# Patient Record
Sex: Male | Born: 1945
Health system: Southern US, Community
[De-identification: ages and names within clinical notes are randomized; demographics above are authoritative.]

## PROBLEM LIST (undated history)

## (undated) DIAGNOSIS — Z95810 Presence of automatic (implantable) cardiac defibrillator: Secondary | ICD-10-CM

## (undated) DIAGNOSIS — E039 Hypothyroidism, unspecified: Secondary | ICD-10-CM

## (undated) DIAGNOSIS — I5022 Chronic systolic (congestive) heart failure: Secondary | ICD-10-CM

## (undated) DIAGNOSIS — E669 Obesity, unspecified: Secondary | ICD-10-CM

## (undated) DIAGNOSIS — C160 Malignant neoplasm of cardia: Secondary | ICD-10-CM

## (undated) DIAGNOSIS — T82118A Breakdown (mechanical) of other cardiac electronic device, initial encounter: Secondary | ICD-10-CM

## (undated) DIAGNOSIS — I428 Other cardiomyopathies: Secondary | ICD-10-CM

## (undated) DIAGNOSIS — I10 Essential (primary) hypertension: Secondary | ICD-10-CM

## (undated) DIAGNOSIS — T8859XA Other complications of anesthesia, initial encounter: Secondary | ICD-10-CM

## (undated) DIAGNOSIS — M1990 Unspecified osteoarthritis, unspecified site: Secondary | ICD-10-CM

## (undated) DIAGNOSIS — T4145XA Adverse effect of unspecified anesthetic, initial encounter: Secondary | ICD-10-CM

## (undated) DIAGNOSIS — E7800 Pure hypercholesterolemia, unspecified: Secondary | ICD-10-CM

## (undated) DIAGNOSIS — I472 Ventricular tachycardia: Secondary | ICD-10-CM

## (undated) DIAGNOSIS — I2510 Atherosclerotic heart disease of native coronary artery without angina pectoris: Secondary | ICD-10-CM

## (undated) HISTORY — DX: Other cardiomyopathies: I42.8

## (undated) HISTORY — DX: Atherosclerotic heart disease of native coronary artery without angina pectoris: I25.10

## (undated) HISTORY — DX: Chronic systolic (congestive) heart failure: I50.22

## (undated) HISTORY — DX: Ventricular tachycardia: I47.2

## (undated) HISTORY — DX: Breakdown (mechanical) of other cardiac electronic device, initial encounter: T82.118A

## (undated) HISTORY — DX: Obesity, unspecified: E66.9

## (undated) HISTORY — PX: CARDIAC DEFIBRILLATOR PLACEMENT: SHX171

## (undated) HISTORY — DX: Presence of automatic (implantable) cardiac defibrillator: Z95.810

## (undated) HISTORY — DX: Malignant neoplasm of cardia: C16.0

## (undated) HISTORY — PX: TONSILLECTOMY AND ADENOIDECTOMY: SUR1326

---

## 2001-11-30 ENCOUNTER — Inpatient Hospital Stay (HOSPITAL_COMMUNITY): Admission: EM | Admit: 2001-11-30 | Discharge: 2001-12-04 | Payer: Self-pay | Admitting: Emergency Medicine

## 2001-11-30 ENCOUNTER — Encounter: Payer: Self-pay | Admitting: Emergency Medicine

## 2001-11-30 HISTORY — PX: CARDIAC CATHETERIZATION: SHX172

## 2004-10-07 ENCOUNTER — Encounter: Admission: RE | Admit: 2004-10-07 | Discharge: 2004-10-07 | Payer: Self-pay | Admitting: *Deleted

## 2004-10-13 ENCOUNTER — Observation Stay (HOSPITAL_COMMUNITY): Admission: AD | Admit: 2004-10-13 | Discharge: 2004-10-14 | Payer: Self-pay | Admitting: *Deleted

## 2004-10-13 ENCOUNTER — Encounter: Payer: Self-pay | Admitting: Internal Medicine

## 2005-07-27 DIAGNOSIS — I4729 Other ventricular tachycardia: Secondary | ICD-10-CM

## 2005-07-27 DIAGNOSIS — I472 Ventricular tachycardia: Secondary | ICD-10-CM

## 2005-07-27 HISTORY — DX: Ventricular tachycardia: I47.2

## 2005-07-27 HISTORY — DX: Other ventricular tachycardia: I47.29

## 2006-01-11 ENCOUNTER — Encounter: Admission: RE | Admit: 2006-01-11 | Discharge: 2006-01-11 | Payer: Self-pay | Admitting: Cardiology

## 2006-01-24 DIAGNOSIS — I2510 Atherosclerotic heart disease of native coronary artery without angina pectoris: Secondary | ICD-10-CM

## 2006-01-24 HISTORY — DX: Atherosclerotic heart disease of native coronary artery without angina pectoris: I25.10

## 2006-01-24 HISTORY — PX: CORONARY ANGIOPLASTY WITH STENT PLACEMENT: SHX49

## 2006-01-28 ENCOUNTER — Ambulatory Visit (HOSPITAL_COMMUNITY): Admission: AD | Admit: 2006-01-28 | Discharge: 2006-01-29 | Payer: Self-pay | Admitting: Cardiology

## 2006-03-27 HISTORY — PX: CARDIAC CATHETERIZATION: SHX172

## 2006-04-09 LAB — ICD DEVICE OBSERVATION

## 2006-07-14 ENCOUNTER — Ambulatory Visit: Payer: Self-pay | Admitting: Internal Medicine

## 2006-08-06 ENCOUNTER — Ambulatory Visit: Payer: Self-pay | Admitting: Internal Medicine

## 2006-08-06 ENCOUNTER — Ambulatory Visit (HOSPITAL_COMMUNITY): Admission: RE | Admit: 2006-08-06 | Discharge: 2006-08-06 | Payer: Self-pay | Admitting: Internal Medicine

## 2010-05-27 HISTORY — PX: NM MYOCAR PERF EJECTION FRACTION: HXRAD630

## 2010-06-03 ENCOUNTER — Encounter: Payer: Self-pay | Admitting: Internal Medicine

## 2010-08-26 NOTE — Letter (Signed)
Summary: [facility]  [facility]   Imported By: Marylou Mccoy 06/27/2010 13:02:35  _____________________________________________________________________  External Attachment:    Type:   Image     Comment:   External Document

## 2011-01-23 ENCOUNTER — Encounter: Payer: Self-pay | Admitting: Internal Medicine

## 2011-01-26 ENCOUNTER — Ambulatory Visit (INDEPENDENT_AMBULATORY_CARE_PROVIDER_SITE_OTHER): Payer: PRIVATE HEALTH INSURANCE | Admitting: Internal Medicine

## 2011-01-26 ENCOUNTER — Encounter: Payer: Self-pay | Admitting: Internal Medicine

## 2011-01-26 DIAGNOSIS — I2510 Atherosclerotic heart disease of native coronary artery without angina pectoris: Secondary | ICD-10-CM | POA: Insufficient documentation

## 2011-01-26 DIAGNOSIS — I4729 Other ventricular tachycardia: Secondary | ICD-10-CM

## 2011-01-26 DIAGNOSIS — T82198A Other mechanical complication of other cardiac electronic device, initial encounter: Secondary | ICD-10-CM | POA: Insufficient documentation

## 2011-01-26 DIAGNOSIS — I5042 Chronic combined systolic (congestive) and diastolic (congestive) heart failure: Secondary | ICD-10-CM | POA: Insufficient documentation

## 2011-01-26 DIAGNOSIS — I472 Ventricular tachycardia, unspecified: Secondary | ICD-10-CM | POA: Insufficient documentation

## 2011-01-26 DIAGNOSIS — Z95810 Presence of automatic (implantable) cardiac defibrillator: Secondary | ICD-10-CM | POA: Insufficient documentation

## 2011-01-26 DIAGNOSIS — I5022 Chronic systolic (congestive) heart failure: Secondary | ICD-10-CM

## 2011-01-26 DIAGNOSIS — I509 Heart failure, unspecified: Secondary | ICD-10-CM

## 2011-01-26 DIAGNOSIS — I428 Other cardiomyopathies: Secondary | ICD-10-CM

## 2011-01-26 HISTORY — DX: Presence of automatic (implantable) cardiac defibrillator: Z95.810

## 2011-01-26 NOTE — Assessment & Plan Note (Signed)
The patient has nonsustained ventricular tachycardia which potentially may represent a significant percentage of his beats based on a cardiogram from Dr. Kandis Cocking office. I would concur with Dr. Alanda Amass at the ongoing use of dronaderone in the setting of left ventricular dysfunction is probably more hazard that it's worse especially given the paucity of apparent benefit. I think the potential contribution however of nonsustained ventricular tachycardia to his current myopathy needs to be considered and would recommend that we try amiodarone and she was happened with this thyroid tissue.

## 2011-01-26 NOTE — Assessment & Plan Note (Signed)
Stable on current medications 

## 2011-01-26 NOTE — Assessment & Plan Note (Signed)
The patient's device was interrogated.  The information was reviewed. No changes were made in the programming.    

## 2011-01-26 NOTE — Assessment & Plan Note (Signed)
We reviewed extensively  The issue of lead fracture and the stratregies for minimizing the likelihood of inappropriate shocks was discussed. We gave the patient a magnet and the explanation letter At that time a device generator replacement his defibrillator lead should be replaced. At that time consideration could also be given depending on his ejection fraction has a CRT upgrade.

## 2011-01-26 NOTE — Progress Notes (Signed)
HPI: Jerry Farley is a 65 y.o. male Seen at the request of Dr. Alanda Amass for consideration of ICD upgrade to CRT.  He is a long-standing history of cardiomyopathy. It is felt to be a combined nonischemic ischemic disease. He undergone  The patient initially in 2003 identify nonischemic cardiomyopathy. In 2006 he underwent ICD implantation by Dr. Severiano Gilbert has a single chamber Medtronic device receiving a [phone] defibrillator lead. The next year or he was hospitalized for polymorphic ventricular tachycardia.This is associated with multiple ICD discharges.  He underwent catheterization in [place] and was found to have a 70% RCA lesion that was treated medically. The circumflex stent was patent.. Because of the ventricular tachycardia, amiodarone was started. Subsequent to this it was discontinued because of thyroid issues the specifics of which are not available. dronaderone was initiated.  His cardiac evaluation over the last year has included a Myoview scan in November 2011 demonstrating some scar but no significant ischemia. An echo May 2012 demonstrated ejection fraction of 35-40%.  A stress test done in early May was no significant runs of nonsustained ventricular tachycardia.  He has modest exercise intolerance shortness of breath at one to 2 flights of stairs. He does not have orthopnea nocturnal dyspnea or edema. Current Outpatient Prescriptions  Medication Sig Dispense Refill  . aspirin 81 MG tablet Take 81 mg by mouth daily.        Marland Kitchen atorvastatin (LIPITOR) 10 MG tablet Take 10 mg by mouth daily.        . carvedilol (COREG) 25 MG tablet Take 25 mg by mouth 2 (two) times daily with a meal.        . clopidogrel (PLAVIX) 75 MG tablet Take 75 mg by mouth daily.        Marland Kitchen dronedarone (MULTAQ) 400 MG tablet Take 400 mg by mouth 2 (two) times daily with a meal.        . ezetimibe (ZETIA) 10 MG tablet Take 10 mg by mouth daily.        . Magnesium Oxide 400 MG CAPS Take 1 capsule by mouth.        .  Multiple Vitamin (MULTIVITAMIN) tablet Take 1 tablet by mouth daily.        . ramipril (ALTACE) 10 MG capsule Take 10 mg by mouth daily.        . fish oil-omega-3 fatty acids 1000 MG capsule Take 2 g by mouth daily.          No Known Allergies  Past Medical History  Diagnosis Date  . NICM (nonischemic cardiomyopathy)   . CAD (coronary artery disease)   . Ventricular tachycardia, non-sustained     No past surgical history on file.  No family history on file. family history is negative for coronary disease.  History   Social History  . Marital Status: Married    Spouse Name: N/A    Number of Children: N/A  . Years of Education: N/A   Occupational History  . retired -- [employer]    Social History Main Topics  . Smoking status: Former Smoker    Quit date: 07/28/2003  . Smokeless tobacco: Never Used  . Alcohol Use: No  . Drug Use: No  . Sexually Active: Not on file   Other Topics Concern  . Not on file   Social History Narrative  . No narrative on file    Fourteen point review of systems was negative except as noted in HPI and PMH glasses, blood  from the urethra.  PHYSICAL EXAMINATION  Blood pressure 96/66, pulse 69, height 5\' 6"  (1.676 m), weight 210 lb 1.9 oz (95.31 kg).   Well developed and nourished middle-aged older Caucasian male appearing his stated agein no acute distress HENT normal Neck supple with JVP-7-8 cm Carotids brisk and full without bruits Back without scoliosis or kyphosis Clear Regular rate and rhythm, PMI is displaced. There is a short systolic murmur. Abd-soft with active BS without hepatomegaly or midline pulsation Femoral pulses 2+ distal pulses intact No Clubbing cyanosis edema Skin-warm and dry LN-neg submandibular and supraclavicular A & Oriented CN 3-12 normal  Grossly normal sensory and motor function Affect engaging . Electrocardiogram today demonstrated sinus rhythm at 69 with intervals of 0.20/0.016/0.43 visit R. Prime in  lead V1 and left axis deviation consistent bifascicular block.  This is in contradistinction to elective cardiogram from Dr. Kandis Cocking office with what appears to be a left bundle. On mental Ecotrin and Advil of nonsustained ventricular tachycardia interestingly with a relatively narrow QRS

## 2011-01-26 NOTE — Assessment & Plan Note (Signed)
Patient has nonischemic cardiomyopathy on guide line associated for surgical therapy. Possibly because of this, an ejection fraction most recently was 35-40%. Hence at this point there be no indication for upgrade to CRT.  I wonder to what degree nonsustained ventricular tachycardia might be contributing to the cardiomyopathy. I partially we do not have histogram data via this device to estimate PVC frequency.

## 2011-01-29 ENCOUNTER — Telehealth: Payer: Self-pay | Admitting: Internal Medicine

## 2011-01-29 NOTE — Telephone Encounter (Signed)
All Cardiac faxed to [facility] for Dr.Weintraub @ [phone]  01/29/11/km

## 2011-06-01 ENCOUNTER — Encounter: Payer: PRIVATE HEALTH INSURANCE | Admitting: Internal Medicine

## 2011-06-16 ENCOUNTER — Encounter: Payer: PRIVATE HEALTH INSURANCE | Admitting: Internal Medicine

## 2011-08-11 ENCOUNTER — Encounter: Payer: Self-pay | Admitting: Internal Medicine

## 2011-08-11 ENCOUNTER — Ambulatory Visit (INDEPENDENT_AMBULATORY_CARE_PROVIDER_SITE_OTHER): Payer: PRIVATE HEALTH INSURANCE | Admitting: Internal Medicine

## 2011-08-11 VITALS — BP 140/84 | HR 67 | Ht 66.0 in | Wt 217.0 lb

## 2011-08-11 DIAGNOSIS — I509 Heart failure, unspecified: Secondary | ICD-10-CM

## 2011-08-11 DIAGNOSIS — T82198A Other mechanical complication of other cardiac electronic device, initial encounter: Secondary | ICD-10-CM

## 2011-08-11 DIAGNOSIS — Z95810 Presence of automatic (implantable) cardiac defibrillator: Secondary | ICD-10-CM

## 2011-08-11 DIAGNOSIS — I4729 Other ventricular tachycardia: Secondary | ICD-10-CM

## 2011-08-11 DIAGNOSIS — I5022 Chronic systolic (congestive) heart failure: Secondary | ICD-10-CM

## 2011-08-11 DIAGNOSIS — I472 Ventricular tachycardia, unspecified: Secondary | ICD-10-CM

## 2011-08-11 DIAGNOSIS — I428 Other cardiomyopathies: Secondary | ICD-10-CM

## 2011-08-11 LAB — ICD DEVICE OBSERVATION
CHARGE TIME: 10.2 s
FVT: 0
RV LEAD AMPLITUDE: 2.2 mv
TZAT-0001SLOWVT: 1
TZAT-0005SLOWVT: 84 pct
TZAT-0005SLOWVT: 91 pct
TZAT-0011FASTVT: 10 ms
TZAT-0011SLOWVT: 10 ms
TZAT-0011SLOWVT: 10 ms
TZAT-0012FASTVT: 200 ms
TZAT-0013FASTVT: 1
TZAT-0013SLOWVT: 2
TZAT-0013SLOWVT: 2
TZAT-0018FASTVT: NEGATIVE
TZAT-0018SLOWVT: NEGATIVE
TZAT-0019FASTVT: 8 V
TZAT-0019SLOWVT: 8 V
TZAT-0020FASTVT: 1.6 ms
TZON-0003FASTVT: 250 ms
TZON-0004SLOWVT: 16
TZON-0005SLOWVT: 12
TZON-0008FASTVT: 1 ms
TZON-0008SLOWVT: 1 ms
TZON-0010FASTVT: 30 ms
TZST-0001FASTVT: 3
TZST-0001FASTVT: 4
TZST-0001FASTVT: 6
TZST-0001SLOWVT: 4
TZST-0001SLOWVT: 6
TZST-0003FASTVT: 20 J
TZST-0003FASTVT: 30 J
TZST-0003FASTVT: 35 J
TZST-0003SLOWVT: 30 J
TZST-0003SLOWVT: 35 J
VENTRICULAR PACING ICD: 0 pct
VF: 0

## 2011-08-11 NOTE — Assessment & Plan Note (Signed)
Continue on current medications. With his congestive symptoms I wonder whether he would be a candidate for Aldactone. I will defer this to Dr. Cloyde Reams

## 2011-08-11 NOTE — Patient Instructions (Signed)
We will see you back on an as needed basis.  

## 2011-08-11 NOTE — Progress Notes (Signed)
HPI  MASON DIBIASIO is a 66 y.o. male Seen for a long-standing history of cardiomyopathy  felt to be a combined nonischemic ischemic disease.  In 2006 he underwent ICD implantation by Dr. Severiano Gilbert has a single chamber Medtronic device receiving a [phone] defibrillator lead. The next year or he was hospitalized for polymorphic ventricular tachycardia.This was associated with multiple ICD discharges.  He underwent catheterization in [place] and was found to have a 70% RCA lesion that was treated medically. The circumflex stent was patent.. Because of the ventricular tachycardia, amiodarone was started. Subsequent to this it was discontinued because of thyroid issues the specifics of which are not available. dronaderone was initiated this has subsequently been discontinued and he is now back on amiodarone.  His cardiac evaluation over the last year has included a Myoview scan in November 2011 demonstrating some scar but no significant ischemia. An echo May 2012 demonstrated ejection fraction of 35-40%.  A stress test done in early May was no significant runs of nonsustained ventricular tachycardia  At initial evaluation in May his QRS duration was narrow. He had been referred for consideration of CRT ugraders  He is not sure why is that he is here today he's had no reassessment of left ventricular function since I saw him last  Shortness of breath is stable there is no edema and no chest pain   Past Medical History  Diagnosis Date  . NICM (nonischemic cardiomyopathy)     ejection fraction 35-40% by echo May 2012  . CAD (coronary artery disease)     prior circumflex stenting  . Ventricular tachycardia, non-sustained   . Automatic implantable cardiac defibrillator in situ   . 6948-lead   . Chronic systolic congestive heart failure     No past surgical history on file.  Current Outpatient Prescriptions  Medication Sig Dispense Refill  . amiodarone (PACERONE) 200 MG tablet Take 200 mg by  mouth daily.      Marland Kitchen aspirin 81 MG tablet Take 81 mg by mouth daily.        Marland Kitchen atorvastatin (LIPITOR) 10 MG tablet Take 10 mg by mouth daily.        . carvedilol (COREG) 25 MG tablet Take 25 mg by mouth 2 (two) times daily with a meal.        . clopidogrel (PLAVIX) 75 MG tablet Take 75 mg by mouth daily.        . Magnesium Oxide 400 MG CAPS Take 1 capsule by mouth.        . Multiple Vitamin (MULTIVITAMIN) tablet Take 1 tablet by mouth daily.        . ramipril (ALTACE) 10 MG capsule Take 10 mg by mouth daily.        Marland Kitchen ezetimibe (ZETIA) 10 MG tablet Take 10 mg by mouth daily.        . fish oil-omega-3 fatty acids 1000 MG capsule Take 2 g by mouth daily.          No Known Allergies  Review of Systems negative except from HPI and PMH  Physical Exam BP 140/84  Pulse 67  Ht 5\' 6"  (1.676 m)  Wt 217 lb (98.431 kg)  BMI 35.02 kg/m2 Well developed and well nourished in no acute distress HENT normal E scleral and icterus clear Neck Supple JVP flat; carotids brisk and full Clear to ausculation Regular rate and rhythm, no murmurs gallops or rub Soft with active bowel sounds No clubbing cyanosis none Edema Alert and oriented,  grossly normal motor and sensory function Skin Warm and Dry  Demonstrates sinus rhythm at 61 Intervals 0.20/0.17/0.48 Axis is leftward -87 there is evidence of right bundle branch block  Assessment and  Plan

## 2011-08-11 NOTE — Assessment & Plan Note (Signed)
The patient's device was interrogated.  The information was reviewed. No changes were made in the programming.    

## 2011-08-11 NOTE — Assessment & Plan Note (Signed)
We reiterated the issues related to the 6949-lead. The family has a magnet and instructions

## 2011-08-11 NOTE — Assessment & Plan Note (Signed)
Ventricular tachycardia has been eliminated with the addition of amiodarone. According to the patient the thyroid issues are stable

## 2011-08-11 NOTE — Assessment & Plan Note (Signed)
As above.

## 2012-01-12 ENCOUNTER — Encounter: Payer: Self-pay | Admitting: Internal Medicine

## 2012-01-22 ENCOUNTER — Encounter: Payer: Self-pay | Admitting: Internal Medicine

## 2012-01-22 ENCOUNTER — Ambulatory Visit (INDEPENDENT_AMBULATORY_CARE_PROVIDER_SITE_OTHER): Payer: Medicare Other | Admitting: Internal Medicine

## 2012-01-22 ENCOUNTER — Encounter: Payer: Self-pay | Admitting: *Deleted

## 2012-01-22 VITALS — BP 126/74 | HR 64 | Ht 66.0 in | Wt 214.0 lb

## 2012-01-22 DIAGNOSIS — Z95810 Presence of automatic (implantable) cardiac defibrillator: Secondary | ICD-10-CM

## 2012-01-22 DIAGNOSIS — I509 Heart failure, unspecified: Secondary | ICD-10-CM

## 2012-01-22 DIAGNOSIS — I472 Ventricular tachycardia, unspecified: Secondary | ICD-10-CM

## 2012-01-22 DIAGNOSIS — I5022 Chronic systolic (congestive) heart failure: Secondary | ICD-10-CM

## 2012-01-22 DIAGNOSIS — I428 Other cardiomyopathies: Secondary | ICD-10-CM

## 2012-01-22 DIAGNOSIS — I4729 Other ventricular tachycardia: Secondary | ICD-10-CM

## 2012-01-22 NOTE — Assessment & Plan Note (Signed)
The patient has an ICD that has reached ERI. He has class II symptoms with a QRS duration of greater than 150 ms and a pattern of-IVCD. It is reasonable to consider CRT upgrade his ejection fraction remains less than 35%.  I have reviewed with him and his wife the potential benefits and risks he understood and are willing to proceed.  Not withstanding his ejection fraction however, he has a history of polymorphic ventricular tachycardia for which the defibrillator was implanted for secondary prevention. He has a 6949-lead in [place]  which requires replacement given his young age and high risk of fracture. He understands this as well

## 2012-01-22 NOTE — Assessment & Plan Note (Signed)
We'll continue amiodarone. No intercurrent defibrillator therapies

## 2012-01-22 NOTE — Assessment & Plan Note (Signed)
As above.

## 2012-01-22 NOTE — Patient Instructions (Signed)
Dr. Graciela Husbands has recommended you have a Bi- Ventricular defibrillator upgrade. Please see the instruction sheet you were given today.

## 2012-01-22 NOTE — Progress Notes (Signed)
HPI  Jerry Farley is a 66 y.o. male Seen for a long-standing history of cardiomyopathy  felt to be a combined nonischemic ischemic disease. In 2006 he underwent ICD implantation by Dr. Severiano Gilbert has a single chamber Medtronic device receiving a [phone] defibrillator lead. The next year or he was hospitalized for polymorphic ventricular tachycardia.This was associated with multiple ICD discharges. He underwent catheterization in [place] and was found to have a 70% RCA lesion that was treated medically. The circumflex stent was patent.. Because of the ventricular tachycardia, amiodarone was started. Subsequent to this it was discontinued because of thyroid issues the specifics of which are not available. dronaderone was initiated this has subsequently been discontinued and he is now back on amiodarone.  His cardiac evaluation has included a Myoview scan in November 2011 demonstrating some scar but no significant ischemia. An echo May 2012 demonstrated ejection fraction of 35-40%.  Repeat echo is pending functinal status is class 2 with modest DOE    He has shortness of breath is stable there is no edema and no chest pain   Past Medical History  Diagnosis Date  . NICM (nonischemic cardiomyopathy)     ejection fraction 35-40% by echo May 2012  . CAD (coronary artery disease)     prior circumflex stenting  . Ventricular tachycardia, non-sustained   . Automatic implantable cardiac defibrillator in situ   . 6948-lead   . Chronic systolic congestive heart failure     No past surgical history on file.  Current Outpatient Prescriptions  Medication Sig Dispense Refill  . amiodarone (PACERONE) 200 MG tablet Take 200 mg by mouth daily.      Marland Kitchen aspirin 81 MG tablet Take 81 mg by mouth daily.        Marland Kitchen atorvastatin (LIPITOR) 10 MG tablet Take 10 mg by mouth daily.        . carvedilol (COREG) 25 MG tablet Take 25 mg by mouth 2 (two) times daily with a meal.        . clopidogrel (PLAVIX) 75 MG tablet  Take 75 mg by mouth daily.        Marland Kitchen ezetimibe (ZETIA) 10 MG tablet Take 10 mg by mouth daily.        . fish oil-omega-3 fatty acids 1000 MG capsule Take 2 g by mouth daily.        Marland Kitchen levothyroxine (SYNTHROID, LEVOTHROID) 25 MCG tablet       . Magnesium Oxide 400 MG CAPS Take 1 capsule by mouth.        . Multiple Vitamin (MULTIVITAMIN) tablet Take 1 tablet by mouth daily.        . ramipril (ALTACE) 10 MG capsule Take 10 mg by mouth daily.        Marland Kitchen spironolactone (ALDACTONE) 25 MG tablet         No Known Allergies  Review of Systems negative except from HPI and PMH  Physical Exam BP 126/74  Pulse 64  Ht 5\' 6"  (1.676 m)  Wt 214 lb (97.07 kg)  BMI 34.54 kg/m2 Well developed and well nourished in no acute distress HENT normal E scleral and icterus clear Neck Supple JVP flat; carotids brisk and full Clear to ausculation Regular rate and rhythm, no murmurs gallops or rub Soft with active bowel sounds No clubbing cyanosis none Edema Alert and oriented, grossly normal motor and sensory function Skin Warm and Dry  We'll echocardiogram demonstrates sinus rhythm at 59 and 7 intervals 21/17/48 Axis is -90  IVCD left axis deviation  Assessment and  Plan

## 2012-01-22 NOTE — Assessment & Plan Note (Signed)
As above Continue current medications. Currently euvolemic

## 2012-01-27 ENCOUNTER — Encounter (HOSPITAL_COMMUNITY): Payer: Self-pay | Admitting: Pharmacy Technician

## 2012-02-01 ENCOUNTER — Other Ambulatory Visit: Payer: Self-pay | Admitting: Internal Medicine

## 2012-02-01 DIAGNOSIS — Z95810 Presence of automatic (implantable) cardiac defibrillator: Secondary | ICD-10-CM

## 2012-02-03 ENCOUNTER — Other Ambulatory Visit (INDEPENDENT_AMBULATORY_CARE_PROVIDER_SITE_OTHER): Payer: Medicare Other

## 2012-02-03 DIAGNOSIS — I4729 Other ventricular tachycardia: Secondary | ICD-10-CM

## 2012-02-03 DIAGNOSIS — I472 Ventricular tachycardia, unspecified: Secondary | ICD-10-CM

## 2012-02-03 DIAGNOSIS — Z95810 Presence of automatic (implantable) cardiac defibrillator: Secondary | ICD-10-CM

## 2012-02-03 DIAGNOSIS — I5022 Chronic systolic (congestive) heart failure: Secondary | ICD-10-CM

## 2012-02-03 DIAGNOSIS — I428 Other cardiomyopathies: Secondary | ICD-10-CM

## 2012-02-03 DIAGNOSIS — I509 Heart failure, unspecified: Secondary | ICD-10-CM

## 2012-02-03 LAB — CBC WITH DIFFERENTIAL/PLATELET
Basophils Relative: 0.6 % (ref 0.0–3.0)
Eosinophils Absolute: 0.3 10*3/uL (ref 0.0–0.7)
Eosinophils Relative: 4.9 % (ref 0.0–5.0)
HCT: 41.1 % (ref 39.0–52.0)
Lymphs Abs: 1.9 10*3/uL (ref 0.7–4.0)
MCHC: 33.2 g/dL (ref 30.0–36.0)
MCV: 97.7 fl (ref 78.0–100.0)
Monocytes Absolute: 0.7 10*3/uL (ref 0.1–1.0)
Neutrophils Relative %: 58.9 % (ref 43.0–77.0)
Platelets: 161 10*3/uL (ref 150.0–400.0)
WBC: 7.1 10*3/uL (ref 4.5–10.5)

## 2012-02-03 LAB — BASIC METABOLIC PANEL
BUN: 17 mg/dL (ref 6–23)
Calcium: 9.3 mg/dL (ref 8.4–10.5)
GFR: 54.3 mL/min — ABNORMAL LOW (ref >60.00)
Glucose, Bld: 120 mg/dL — ABNORMAL HIGH (ref 70–99)
Potassium: 5.3 mEq/L — ABNORMAL HIGH (ref 3.5–5.1)
Sodium: 138 mEq/L (ref 135–145)

## 2012-02-04 ENCOUNTER — Encounter: Payer: Self-pay | Admitting: Internal Medicine

## 2012-02-09 MED ORDER — CEFAZOLIN SODIUM-DEXTROSE 2-3 GM-% IV SOLR
2.0000 g | INTRAVENOUS | Status: DC
  Filled 2012-02-09: qty 50

## 2012-02-09 MED ORDER — SODIUM CHLORIDE 0.9 % IR SOLN
80.0000 mg | Status: DC
  Filled 2012-02-09: qty 2

## 2012-02-10 ENCOUNTER — Encounter (HOSPITAL_COMMUNITY): Payer: Self-pay | Admitting: General Practice

## 2012-02-10 ENCOUNTER — Ambulatory Visit (HOSPITAL_COMMUNITY)
Admission: RE | Admit: 2012-02-10 | Discharge: 2012-02-11 | Disposition: A | Discharge status: Home / Self Care | Payer: Medicare Other | Source: Ambulatory Visit | Attending: Internal Medicine | Admitting: Internal Medicine

## 2012-02-10 ENCOUNTER — Ambulatory Visit (HOSPITAL_COMMUNITY): Payer: Medicare Other

## 2012-02-10 ENCOUNTER — Encounter (HOSPITAL_COMMUNITY)
Admission: RE | Disposition: A | Discharge status: Home / Self Care | Payer: Self-pay | Source: Ambulatory Visit | Attending: Internal Medicine

## 2012-02-10 DIAGNOSIS — I5022 Chronic systolic (congestive) heart failure: Secondary | ICD-10-CM | POA: Insufficient documentation

## 2012-02-10 DIAGNOSIS — Z95810 Presence of automatic (implantable) cardiac defibrillator: Secondary | ICD-10-CM | POA: Diagnosis present

## 2012-02-10 DIAGNOSIS — I472 Ventricular tachycardia, unspecified: Secondary | ICD-10-CM | POA: Diagnosis present

## 2012-02-10 DIAGNOSIS — Z4502 Encounter for adjustment and management of automatic implantable cardiac defibrillator: Secondary | ICD-10-CM | POA: Insufficient documentation

## 2012-02-10 DIAGNOSIS — I509 Heart failure, unspecified: Secondary | ICD-10-CM | POA: Insufficient documentation

## 2012-02-10 DIAGNOSIS — I2510 Atherosclerotic heart disease of native coronary artery without angina pectoris: Secondary | ICD-10-CM | POA: Insufficient documentation

## 2012-02-10 DIAGNOSIS — I4729 Other ventricular tachycardia: Secondary | ICD-10-CM

## 2012-02-10 DIAGNOSIS — I5042 Chronic combined systolic (congestive) and diastolic (congestive) heart failure: Secondary | ICD-10-CM | POA: Diagnosis present

## 2012-02-10 DIAGNOSIS — I428 Other cardiomyopathies: Secondary | ICD-10-CM | POA: Insufficient documentation

## 2012-02-10 HISTORY — DX: Other complications of anesthesia, initial encounter: T88.59XA

## 2012-02-10 HISTORY — DX: Adverse effect of unspecified anesthetic, initial encounter: T41.45XA

## 2012-02-10 HISTORY — DX: Presence of automatic (implantable) cardiac defibrillator: Z95.810

## 2012-02-10 HISTORY — DX: Unspecified osteoarthritis, unspecified site: M19.90

## 2012-02-10 HISTORY — DX: Hypothyroidism, unspecified: E03.9

## 2012-02-10 HISTORY — PX: BI-VENTRICULAR IMPLANTABLE CARDIOVERTER DEFIBRILLATOR UPGRADE: SHX5461

## 2012-02-10 HISTORY — DX: Pure hypercholesterolemia, unspecified: E78.00

## 2012-02-10 LAB — SURGICAL PCR SCREEN: MRSA, PCR: NEGATIVE

## 2012-02-10 SURGERY — BI-VENTRICULAR IMPLANTABLE CARDIOVERTER DEFIBRILLATOR UPGRADE
Anesthesia: LOCAL

## 2012-02-10 MED ORDER — RAMIPRIL 10 MG PO CAPS
10.0000 mg | ORAL_CAPSULE | Freq: Every day | ORAL | Status: DC
  Administered 2012-02-11: 11:00:00 10 mg via ORAL
  Filled 2012-02-10: qty 1

## 2012-02-10 MED ORDER — CHLORHEXIDINE GLUCONATE 4 % EX LIQD: 60.0000 mL | Freq: Once | CUTANEOUS | Status: DC

## 2012-02-10 MED ORDER — HEPARIN (PORCINE) IN NACL 2-0.9 UNIT/ML-% IJ SOLN
INTRAMUSCULAR | Status: AC
  Filled 2012-02-10: qty 1000

## 2012-02-10 MED ORDER — EZETIMIBE 10 MG PO TABS
10.0000 mg | ORAL_TABLET | Freq: Every day | ORAL | Status: DC
  Administered 2012-02-11: 11:00:00 10 mg via ORAL
  Filled 2012-02-10: qty 1

## 2012-02-10 MED ORDER — ONDANSETRON HCL 4 MG/2ML IJ SOLN: 4.0000 mg | Freq: Four times a day (QID) | INTRAMUSCULAR | Status: DC | PRN

## 2012-02-10 MED ORDER — LEVOTHYROXINE SODIUM 25 MCG PO TABS
25.0000 ug | ORAL_TABLET | Freq: Every day | ORAL | Status: DC
  Administered 2012-02-11: 25 ug via ORAL
  Filled 2012-02-10 (×2): qty 1

## 2012-02-10 MED ORDER — SODIUM CHLORIDE 0.9 % IV SOLN
250.0000 mL | INTRAVENOUS | Status: DC
  Administered 2012-02-10: 1000 mL via INTRAVENOUS

## 2012-02-10 MED ORDER — SPIRONOLACTONE 12.5 MG HALF TABLET
12.5000 mg | ORAL_TABLET | Freq: Once | ORAL | Status: AC
  Administered 2012-02-10: 18:00:00 12.5 mg via ORAL
  Filled 2012-02-10 (×2): qty 1

## 2012-02-10 MED ORDER — AMIODARONE HCL 200 MG PO TABS
200.0000 mg | ORAL_TABLET | Freq: Every day | ORAL | Status: DC
  Administered 2012-02-11: 200 mg via ORAL
  Filled 2012-02-10: qty 1

## 2012-02-10 MED ORDER — LIDOCAINE HCL (PF) 1 % IJ SOLN
INTRAMUSCULAR | Status: AC
  Filled 2012-02-10: qty 60

## 2012-02-10 MED ORDER — MIDAZOLAM HCL 5 MG/5ML IJ SOLN
INTRAMUSCULAR | Status: AC
  Filled 2012-02-10: qty 5

## 2012-02-10 MED ORDER — MUPIROCIN 2 % EX OINT
TOPICAL_OINTMENT | Freq: Two times a day (BID) | CUTANEOUS | Status: DC
  Filled 2012-02-10: qty 22

## 2012-02-10 MED ORDER — SODIUM CHLORIDE 0.45 % IV SOLN
INTRAVENOUS | Status: DC
  Administered 2012-02-10: 1000 mL via INTRAVENOUS

## 2012-02-10 MED ORDER — FENTANYL CITRATE 0.05 MG/ML IJ SOLN
INTRAMUSCULAR | Status: AC
  Filled 2012-02-10: qty 2

## 2012-02-10 MED ORDER — CARVEDILOL 25 MG PO TABS
25.0000 mg | ORAL_TABLET | Freq: Two times a day (BID) | ORAL | Status: DC
  Administered 2012-02-10 – 2012-02-11 (×2): 25 mg via ORAL
  Filled 2012-02-10 (×4): qty 1

## 2012-02-10 MED ORDER — SODIUM CHLORIDE 0.9 % IV SOLN
INTRAVENOUS | Status: AC
  Administered 2012-02-10: 13:00:00 via INTRAVENOUS

## 2012-02-10 MED ORDER — CLOPIDOGREL BISULFATE 75 MG PO TABS
75.0000 mg | ORAL_TABLET | Freq: Every day | ORAL | Status: DC
  Administered 2012-02-11: 75 mg via ORAL
  Filled 2012-02-10: qty 1

## 2012-02-10 MED ORDER — ATORVASTATIN CALCIUM 10 MG PO TABS
10.0000 mg | ORAL_TABLET | Freq: Every day | ORAL | Status: DC
  Filled 2012-02-10: qty 1

## 2012-02-10 MED ORDER — SODIUM CHLORIDE 0.9 % IJ SOLN: 3.0000 mL | INTRAMUSCULAR | Status: DC | PRN

## 2012-02-10 MED ORDER — ASPIRIN 81 MG PO CHEW
81.0000 mg | CHEWABLE_TABLET | Freq: Every day | ORAL | Status: DC
  Administered 2012-02-11: 11:00:00 81 mg via ORAL
  Filled 2012-02-10: qty 1

## 2012-02-10 MED ORDER — SODIUM CHLORIDE 0.9 % IJ SOLN: 3.0000 mL | Freq: Two times a day (BID) | INTRAMUSCULAR | Status: DC

## 2012-02-10 MED ORDER — ASPIRIN 81 MG PO TABS
81.0000 mg | ORAL_TABLET | Freq: Every day | ORAL | Status: DC
Start: 2012-02-10 — End: 2012-02-10

## 2012-02-10 MED ORDER — ACETAMINOPHEN 325 MG PO TABS: 325.0000 mg | ORAL_TABLET | ORAL | Status: DC | PRN

## 2012-02-10 MED ORDER — CEFAZOLIN SODIUM 1-5 GM-% IV SOLN
1.0000 g | Freq: Four times a day (QID) | INTRAVENOUS | Status: AC
  Administered 2012-02-10 – 2012-02-11 (×3): 1 g via INTRAVENOUS
  Filled 2012-02-10 (×4): qty 50

## 2012-02-10 NOTE — CV Procedure (Addendum)
TAYSHUN GAPPA [id number]  [id number]  Preop Dx: IVCD, CHF prev ICD with Recall ICD lead generator at [facility] Postop Dx same/  Procedure: venogram; device explant generator implant, new atrial, RV and LV lead; HIgh voltage testing   Cx Hypotension>>fluid bolus  Flouroscopy> normal heart motion, pt without pain and sob  Dictation number [id number]  Sherryl Manges, MD 02/10/2012 12:18 PM

## 2012-02-11 ENCOUNTER — Ambulatory Visit (HOSPITAL_COMMUNITY): Payer: Medicare Other

## 2012-02-11 DIAGNOSIS — I428 Other cardiomyopathies: Secondary | ICD-10-CM

## 2012-02-11 HISTORY — DX: Other cardiomyopathies: I42.8

## 2012-02-11 LAB — BASIC METABOLIC PANEL
Calcium: 9 mg/dL (ref 8.4–10.5)
GFR calc Af Amer: 78 mL/min — ABNORMAL LOW (ref >90)
GFR calc non Af Amer: 67 mL/min — ABNORMAL LOW (ref >90)
Potassium: 4.9 mEq/L (ref 3.5–5.1)
Sodium: 142 mEq/L (ref 135–145)

## 2012-02-11 MED ORDER — YOU HAVE A PACEMAKER BOOK: 1.0000 | Freq: Once | Status: DC

## 2012-02-11 MED ORDER — YOU HAVE A PACEMAKER BOOK
Freq: Once | Status: DC
  Filled 2012-02-11: qty 1

## 2012-02-11 NOTE — Op Note (Signed)
Jerry Farley, Jerry Farley NO.:  [id number]  MEDICAL RECORD NO.:  [id number]  LOCATION:  6524                         FACILITY:  [facility]  PHYSICIAN:  Duke Salvia, MD, FACCDATE OF BIRTH:  06/20/1946  DATE OF PROCEDURE:  02/10/2012 DATE OF DISCHARGE:                              OPERATIVE REPORT   PREOPERATIVE DIAGNOSES:  Intraventricular conduction defect, congestive heart failure, previously implanted implantable cardioverter- defibrillator with a 6948 Fidelis lead.  POSTOPERATIVE DIAGNOSES:  Intraventricular conduction defect, congestive heart failure, previously implanted implantable cardioverter- defibrillator with a [phone] Fidelis lead.  PROCEDURE:  Contrast venogram, implantation of a new defibrillator system with atrial lead, ventricular lead and left ventricular lead, explantation of old device, insertion of a new device with pocket revision, and intraoperative defibrillation threshold testing.  DESCRIPTION OF PROCEDURE:  Following obtaining informed consent, the patient was brought to the Electrophysiology Laboratory and placed on the fluoroscopic table in supine position.  The contrast venogram demonstrated the patency of the extrathoracic left subclavian vein and we then undertook routine prep and drape of the left upper chest. Thereafter, lidocaine was infiltrated transverse to the previous incision and carried down to layer of the prepectoral fascia.  Using sharp dissection with electrocautery, the pocket was not opened.  Venous access was obtained x3 and sequentially a 9, 9.5, and 7-French sheaths were placed, through which, we passed a Medtronic 6935 62-cm active fixation defibrillator lead, with a DF4 header, serial number [id number] and Medtronic MB2 coronary sinus cannulation catheter.  Through which, we then replaced a 4396 lead, serial number [id number] and then Medtronic 5076 52-cm active fixation atrial lead, serial  number [id number].  Under fluoroscopic guidance, the right ventricular lead was manipulated to the right ventricular apex where it was positioned and then had to be repositioned because of deteriorating R-wave assessment.  In this second location, the amplitude was 9.2 with a pacing impedance of 856, a threshold of 1.1 at 0.5.  Current of threshold was 1.2 mA.  There was no diaphragmatic pacing at 10 volts and the current of injury was brisk. The lead was secured to the prepectoral fascia.  We then obtained access readily to the coronary sinus and venography demonstrated a lateral branch and a high lateral branch.  We target to the former.  It turned out not to be a very big vein and so we used a 4396 lead, which was put into its end position, which was about halfway between base and apex.  In this location, the LV amplitude was 9 with a pacing impedance of 1800, threshold of 0.7 volts at 0.5 milliseconds. The current was about 1 mA and there was no diaphragmatic pacing at 10 volts.  The 9.5-French sheath was removed after which the RA lead was deployed. The amplitude in the right atrial appendage with the second location was 6 with a pacing impedance of 1019 and threshold of 1.4 at 0.5.  The current of injury was modest in the atrium.  These leads were secured to the prepectoral fascia and then to the deployment system.  The LV lead was removed and the lead was re-secured.  The leads were then attached to a Medtronic [id number]  defibrillator, serial number [id number].  Through the device, the bipolar R-wave was 4.5 with a pacing impedance of 684, threshold of 1.25 at 0.4.  The RV amplitude in the tip to coil configuration was 5.6 with impedance of 570, the threshold of 1 volt at 0.4 and the LV impedance was 1083 with threshold of 0.5 at 0.4.  High-voltage impedance was 93 ohms.  The pocket was copiously irrigated with antibiotic-containing saline solution, but prior to this, we had  had to extend the pocket caudally to allow for housing of the new device and we actually excised most of the floor of the device.  We capped the previously implanted 6948 Sprint Fidelis lead.  The leads and the pulse generator were placed in the pocket secured to the prepectoral fascia, and then the wound was closed in 3 layers in normal fashion.  The wound was washed, dried, and a benzoin, Steri-Strip dressing was applied.  We then scrubbed out to undertake defibrillation threshold testing.  Multiple attempts were undertaken to introduce ventricular fibrillation.  We were able to introduce ventricular tachycardia, which was cardioverted.  The high- voltage impedance was 93 ohms.  The device was implanted.  The patient tolerated the procedure without apparent complications.     Duke Salvia, MD, [facility]     SCK/MEDQ  D:  02/10/2012  T:  02/11/2012  Job:  [id number]  cc:   Gerlene Burdock A. Alanda Amass, M.D.

## 2012-02-11 NOTE — Progress Notes (Addendum)
Patient Name: Jerry Farley Date of Encounter: 02/11/2012     Principal Problem:  *Cardiac defibrillator -medtronic Active Problems:  CAD (coronary artery disease)  Chronic systolic congestive heart failure  Nonischemic cardiomyopathy  Ventricular tachycardia-polymorphic  Ischemic cardiomyopathy    SUBJECTIVE: Feels well this AM. Reports mild incision site soreness. Specifically denies shortness of breath, palpitations, chest pain, lightheadedness, n/v or diaphoresis.   OBJECTIVE  Filed Vitals:   02/10/12 1406 02/10/12 1639 02/10/12 1941 02/11/12 0024  BP: 101/49 102/51 101/48 109/57  Pulse: 59 54 64 56  Temp: 97.7 F (36.5 C) 97.7 F (36.5 C) 98.4 F (36.9 C) 98.2 F (36.8 C)  TempSrc: Oral Oral Oral Oral  Resp: 20 19 17 11   Height:      Weight:    97.3 kg (214 lb 8.1 oz)  SpO2: 97% 97% 97% 96%    Intake/Output Summary (Last 24 hours) at 02/11/12 2130 Last data filed at 02/11/12 0158  Gross per 24 hour  Intake    240 ml  Output    775 ml  Net   -535 ml   PHYSICAL EXAM  General: Well developed, well nourished, in no acute distress. Head: Normocephalic, atraumatic, sclera non-icteric, no xanthomas, nares are without discharge.  Neck: Supple without bruits or JVD. Lungs:  Resp regular and unlabored, CTAB without wheezes, rales or rhonchi Heart: RRR no s3, s4, or murmurs. Abdomen: Soft, non-tender, non-distended, BS + x 4.  Msk:  Strength and tone appears normal for age. Extremities: No clubbing, cyanosis or edema. DP/PT/Radials 2+ and equal bilaterally. Neuro: Alert and oriented X 3. Moves all extremities spontaneously. Psych: Normal affect.  LABS:  None  TELE: A-sensed, V-paced, 60 bpm, occasional A-V sequential pacing  ECG: A-sensed, V-paced, 58 bpm, occasional A-V sequential pacing   Radiology/Studies:  Dg Chest 2 View  02/10/2012  *RADIOLOGY REPORT*  Clinical Data: Pre AICD generator change, history CHF, tachycardia, coronary artery disease,  cardiomyopathy, smoking  CHEST - 2 VIEW  Comparison: 01/11/2006  Findings: Left subclavian AICD lead tip projects over right ventricle. Upper normal heart size. Atherosclerotic calcification aorta. Mediastinal contours and pulmonary vascularity normal. Minimal atelectasis left base. Lungs otherwise clear. No gross pleural effusion or pneumothorax.  IMPRESSION: Minimal left base atelectasis.  Original Report Authenticated By: Lollie Marrow, M.D.   02/11/12 CXR pending  Current Medications:     . amiodarone  200 mg Oral Daily  . aspirin  81 mg Oral Daily  . atorvastatin  10 mg Oral Daily  . carvedilol  25 mg Oral BID WC  .  ceFAZolin (ANCEF) IV  1 g Intravenous Q6H  . clopidogrel  75 mg Oral Q breakfast  . ezetimibe  10 mg Oral Daily  . fentaNYL      . fentaNYL      . heparin      . levothyroxine  25 mcg Oral QAC breakfast  . lidocaine      . midazolam      . midazolam      . ramipril  10 mg Oral Daily  . spironolactone  12.5 mg Oral Once  . DISCONTD: aspirin  81 mg Oral Daily  . DISCONTD:  ceFAZolin (ANCEF) IV  2 g Intravenous On Call  . DISCONTD: chlorhexidine  60 mL Topical Once  . DISCONTD: gentamicin irrigation  80 mg Irrigation On Call  . DISCONTD: mupirocin ointment   Nasal BID  . DISCONTD: sodium chloride  3 mL Intravenous Q12H    ASSESSMENT  AND PLAN:  1. ICD ERI s/p CRT-D upgrade yesterday 2. History of polymorphic VT 3. NICM 4. Chronic systolic CHF  PLAN/DISCUSSION:  Patient underwent prior ICD explant and upgrade to Medtronic CRT-D (new generator, atrial, RV/LV leads) yesterday given QRS > , IVCD and EF < 35%. VT was induced and successfully cardioverted. Tolerated procedure well. EKG demonstrates A-sensing, V-pacing and occasional A-V sequential pacing. No events on telemetry. Bedside CXR being performing currently. No complaints this AM. Lungs clear on exam. Incision site mildly tender to palpation, otherwise healing well. Likely discharge later today pending  CXR and recommendations from Dr. Graciela Husbands.   Signed, R. Hurman Horn, PA-C 02/11/2012, 6:43 AM  ok to discharge instructions given  wound check LHC 10-14 d F/u RAW 2-3 weeks sk 3 months CXR hard to to interpret but LV lead looks stable Sherryl Manges, MD 02/11/2012 9:36 AM

## 2012-02-11 NOTE — Discharge Summary (Signed)
ELECTROPHYSIOLOGY DISCHARGE SUMMARY    Patient ID: Jerry Farley,  MRN: [id number], DOB/AGE: 04-12-1946 66 y.o.  Admit date: 02/10/2012 Discharge date: 02/11/2012  Primary Cardiologist: Alanda Amass, MD  Primary Discharge Diagnosis:  1. 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 Chronic systolic CHF 3. IVCD  Secondary Discharge Diagnoses:  1. NSVT 2. CAD  Procedures This Admission:  1. BiV ICD upgrade Contrast venogram, implantation of a new defibrillator system with atrial lead, right ventricular lead (previous RV lead on recall) and left ventricular lead, explantation of old device, insertion of a new device with pocket revision and intraoperative defibrillation threshold testing.  RV lead - Medtronic [id number] 62-cm active fixation defibrillator lead, with a DF4 header, serial number [id number]. The old RV lead was capped. LV lead - 4396 lead, serial number [id number]. RA lead - Medtronic 5076 52-cm active fixation atrial lead, serial number [id number]. Medtronic [id number] defibrillator, serial number [id number].  History and Hospital Course:  Mr. Mcginness is a 66 year old gentleman with long-standing history of cardiomyopathy, felt to be combined nonischemic/ischemic cause. In 2006 he underwent ICD implantation by Dr. Severiano Gilbert - single chamber Medtronic ICD with [phone] defibrillator lead. The following year he was hospitalized for polymorphic ventricular tachycardia. This was associated with multiple ICD discharges. He underwent catheterization in [place] and was found to have a 70% RCA lesion that was treated medically. The circumflex stent was patent. He was started on amiodarone for treatment of VT. His most recent cardiac evaluation has included a Myoview scan in November 2011 which demonstrated some scar but no significant ischemia. An echo May 2012 demonstrated ejection fraction of 35-40%. He was seen in follow-up by Dr. Graciela Husbands June 2013 at  which time his device battery was found to be at [facility]. A repeat echocardiogram was ordered and according to Dr. Odessa Fleming note, his EF remains less than 35%. He has class II CHF symptoms with a QRS duration of greater than 150 ms and a pattern of-IVCD. It was felt he would benefit from CRT upgrade. On 02/10/2012, he underwent BiV ICD upgrade for CRT. The patient tolerated this procedure well without any immediate complication. He remains hemodynamically stable and afebrile. His chest xray shows stable lead placement without pneumothorax. His device interrogation shows normal BiV ICD function with stable lead parameters/measurements. His implant site is intact without significant bleeding or hematoma. He has been given discharge instructions including wound care and activity restrictions. He will follow-up in 10 days for wound check. There were no changes made to his medications. He has been seen, examined and deemed stable for discharge today by Dr. Sherryl Manges.  Discharge Vitals: Blood pressure 119/68, pulse 61, temperature 98.1 F (36.7 C), temperature source Oral, resp. rate 15, height 5\' 6"  (1.676 m), weight 214 lb 8.1 oz (97.3 kg), SpO2 98%   Labs: Lab Results  Component Value Date   WBC 7.1 02/03/2012   HGB 13.6 02/03/2012   HCT 41.1 02/03/2012   MCV 97.7 02/03/2012   PLT 161.0 02/03/2012     Lab 02/11/12 0521  NA 142  K 4.9  CL 106  CO2 25  BUN 14  CREATININE 1.11  CALCIUM 9.0  PROT --  BILITOT --  ALKPHOS --  ALT --  AST --  GLUCOSE 98    Disposition:  The patient is being discharged in stable condition.  Follow-up: Follow-up Information    Follow up with [facility] on 02/17/2012. (At 2:30 PM  for wound check)    Contact information:   [address] [place] [address] [phone]      Follow up with Berton Mount, MD on 05/17/2012. (At 10:30 AM)    Contact information:   [address] [place]  [place] [address] [phone]   Discharge Medications:  Medication List  As of 02/11/2012  9:19 AM   TAKE these medications         amiodarone 200 MG tablet   Commonly known as: PACERONE   Take 200 mg by mouth daily.      aspirin 81 MG tablet   Take 81 mg by mouth daily.      atorvastatin 10 MG tablet   Commonly known as: LIPITOR   Take 10 mg by mouth daily.      carvedilol 25 MG tablet   Commonly known as: COREG   Take 25 mg by mouth 2 (two) times daily with a meal.      clopidogrel 75 MG tablet   Commonly known as: PLAVIX   Take 75 mg by mouth daily.      ezetimibe 10 MG tablet   Commonly known as: ZETIA   Take 10 mg by mouth daily.      fish oil-omega-3 fatty acids 1000 MG capsule   Take 1 g by mouth daily.      levothyroxine 25 MCG tablet   Commonly known as: SYNTHROID, LEVOTHROID   Take 25 mcg by mouth daily.      Magnesium Oxide 400 MG Caps   Take 1 capsule by mouth.      multivitamin tablet   Take 1 tablet by mouth daily.      ramipril 10 MG capsule   Commonly known as: ALTACE   Take 10 mg by mouth daily.      spironolactone 25 MG tablet   Commonly known as: ALDACTONE   Take 25 mg by mouth.      Duration of Discharge Encounter: Greater than 30 minutes including physician time.  Signed, Rick Duff, PA-C 02/11/2012, 9:19 AM

## 2012-02-17 ENCOUNTER — Encounter: Payer: Self-pay | Admitting: Internal Medicine

## 2012-02-17 ENCOUNTER — Ambulatory Visit (INDEPENDENT_AMBULATORY_CARE_PROVIDER_SITE_OTHER): Payer: Medicare Other | Admitting: *Deleted

## 2012-02-17 DIAGNOSIS — I472 Ventricular tachycardia, unspecified: Secondary | ICD-10-CM

## 2012-02-17 DIAGNOSIS — I4729 Other ventricular tachycardia: Secondary | ICD-10-CM

## 2012-02-17 DIAGNOSIS — I428 Other cardiomyopathies: Secondary | ICD-10-CM

## 2012-02-17 LAB — ICD DEVICE OBSERVATION
AL AMPLITUDE: 6 mv
AL IMPEDENCE ICD: 513 Ohm
AL THRESHOLD: 0.75 V
ATRIAL PACING ICD: 2.05 pct
BATTERY VOLTAGE: 3.2037 V
LV LEAD IMPEDENCE ICD: 798 Ohm
RV LEAD IMPEDENCE ICD: 475 Ohm
RV LEAD THRESHOLD: 1 V
TOT-0002: 0
TOT-0006: 20130717000000
TZAT-0001ATACH: 1
TZAT-0001ATACH: 3
TZAT-0001FASTVT: 1
TZAT-0001SLOWVT: 1
TZAT-0001SLOWVT: 2
TZAT-0002ATACH: NEGATIVE
TZAT-0002ATACH: NEGATIVE
TZAT-0002FASTVT: NEGATIVE
TZAT-0005SLOWVT: 88 pct
TZAT-0005SLOWVT: 91 pct
TZAT-0012ATACH: 150 ms
TZAT-0013SLOWVT: 2
TZAT-0013SLOWVT: 2
TZAT-0018ATACH: NEGATIVE
TZAT-0018ATACH: NEGATIVE
TZAT-0018SLOWVT: NEGATIVE
TZAT-0018SLOWVT: NEGATIVE
TZAT-0019ATACH: 6 V
TZAT-0019FASTVT: 8 V
TZAT-0019SLOWVT: 8 V
TZAT-0019SLOWVT: 8 V
TZAT-0020SLOWVT: 1.5 ms
TZON-0004SLOWVT: 16
TZON-0004VSLOWVT: 20
TZON-0005SLOWVT: 12
TZST-0001ATACH: 5
TZST-0001ATACH: 6
TZST-0001FASTVT: 3
TZST-0001FASTVT: 4
TZST-0001FASTVT: 5
TZST-0001SLOWVT: 3
TZST-0001SLOWVT: 6
TZST-0002ATACH: NEGATIVE
TZST-0002ATACH: NEGATIVE
TZST-0002ATACH: NEGATIVE
TZST-0002FASTVT: NEGATIVE
TZST-0002FASTVT: NEGATIVE
TZST-0003SLOWVT: 35 J
VENTRICULAR PACING ICD: 99.65 pct
VF: 0

## 2012-02-17 NOTE — Progress Notes (Signed)
Wound check defib in clinic  

## 2012-05-17 ENCOUNTER — Encounter: Payer: Self-pay | Admitting: Internal Medicine

## 2012-05-17 ENCOUNTER — Ambulatory Visit (INDEPENDENT_AMBULATORY_CARE_PROVIDER_SITE_OTHER): Payer: Medicare Other | Admitting: Internal Medicine

## 2012-05-17 VITALS — BP 94/62 | HR 60 | Ht 66.0 in | Wt 216.6 lb

## 2012-05-17 DIAGNOSIS — Z95810 Presence of automatic (implantable) cardiac defibrillator: Secondary | ICD-10-CM

## 2012-05-17 DIAGNOSIS — I472 Ventricular tachycardia, unspecified: Secondary | ICD-10-CM

## 2012-05-17 DIAGNOSIS — I509 Heart failure, unspecified: Secondary | ICD-10-CM

## 2012-05-17 DIAGNOSIS — I5022 Chronic systolic (congestive) heart failure: Secondary | ICD-10-CM

## 2012-05-17 DIAGNOSIS — I4729 Other ventricular tachycardia: Secondary | ICD-10-CM

## 2012-05-17 NOTE — Assessment & Plan Note (Signed)
The patient's device was interrogated and the information was fully reviewed.  The device was reprogrammed to  Maximize longevity 

## 2012-05-17 NOTE — Assessment & Plan Note (Signed)
Stable on current medications without evidence of volume overload

## 2012-05-17 NOTE — Progress Notes (Signed)
Patient Care Team: Renard Hamper as PCP - General (Family Medicine)   HPI  Jerry Farley is a 66 y.o. male Seen in followup for ICD implanted to remotely for which he underwent device generator replacement 7/13. Indication was polymorphic ventricular tachycardia for which he took amiodarone. This is occurring in the context of nonischemic cardiomyopathy. He also has a history of a 6949-lead which was revised the time of his procedure. He also underwent CRT upgrade of atrial lead and an LV lead.  The patient denies chest pain, shortness of breath, nocturnal dyspnea, orthopnea or peripheral edema.  There have been no palpitations, lightheadedness or syncope.     Past Medical History  Diagnosis Date  . NICM (nonischemic cardiomyopathy)     ejection fraction 35-40% by echo May 2012  . CAD (coronary artery disease)     prior circumflex stenting  . Ventricular tachycardia, non-sustained   . 6948-lead   . Chronic systolic congestive heart failure   . ICD (implantable cardiac defibrillator) in place   . Complication of anesthesia     "problems waking up w/valium"  . High cholesterol   . Shortness of breath 2005  . Hypothyroidism   . Arthritis     "maybe a little"    Past Surgical History  Procedure Date  . Cardiac defibrillator placement 09/2004; 02/10/12  . Tonsillectomy and adenoidectomy     "when I was a child"  . Coronary angioplasty with stent placement 2005; 01/2006; 03/2006    "1 +1 +1 "    Current Outpatient Prescriptions  Medication Sig Dispense Refill  . amiodarone (PACERONE) 200 MG tablet Take 200 mg by mouth daily.      Marland Kitchen aspirin 81 MG tablet Take 81 mg by mouth daily.        Marland Kitchen atorvastatin (LIPITOR) 10 MG tablet Take 10 mg by mouth daily.        . carvedilol (COREG) 25 MG tablet Take 25 mg by mouth 2 (two) times daily with a meal.        . clopidogrel (PLAVIX) 75 MG tablet Take 75 mg by mouth daily.        Marland Kitchen ezetimibe (ZETIA) 10 MG tablet Take 10 mg by mouth  daily.        . fish oil-omega-3 fatty acids 1000 MG capsule Take 1 g by mouth daily.       Marland Kitchen levothyroxine (SYNTHROID, LEVOTHROID) 25 MCG tablet Take 25 mcg by mouth daily.       . Magnesium Oxide 400 MG CAPS Take 1 capsule by mouth.        . Multiple Vitamin (MULTIVITAMIN) tablet Take 1 tablet by mouth daily.        . ramipril (ALTACE) 10 MG capsule Take 10 mg by mouth daily.        Marland Kitchen spironolactone (ALDACTONE) 25 MG tablet Take 25 mg by mouth.       . you have a pacemaker book MISC 1 each by Does not apply route once.  1 each  0    No Known Allergies  Review of Systems negative except from HPI and PMH  Physical Exam There were no vitals taken for this visit. Well developed and well nourished in no acute distress HENT normal E scleral and icterus clear Neck Supple JVP flat; carotids brisk and full Clear to ausculation Device pocket well healed; without hematoma or erythema Regular rate and rhythm, no murmurs gallops or rub Soft with active bowel sounds No  clubbing cyanosis none Edema Alert and oriented, grossly normal motor and sensory function Skin Warm and Dry    Assessment and  Plan

## 2012-08-02 ENCOUNTER — Other Ambulatory Visit (HOSPITAL_COMMUNITY): Payer: Self-pay | Admitting: Cardiovascular Disease

## 2012-08-02 DIAGNOSIS — I714 Abdominal aortic aneurysm, without rupture, unspecified: Secondary | ICD-10-CM

## 2012-08-02 DIAGNOSIS — R0989 Other specified symptoms and signs involving the circulatory and respiratory systems: Secondary | ICD-10-CM

## 2012-08-29 ENCOUNTER — Ambulatory Visit (HOSPITAL_COMMUNITY)
Admission: RE | Admit: 2012-08-29 | Discharge: 2012-08-29 | Disposition: A | Discharge status: Home / Self Care | Payer: Medicare Other | Source: Ambulatory Visit | Attending: Cardiovascular Disease | Admitting: Cardiovascular Disease

## 2012-08-29 ENCOUNTER — Encounter (HOSPITAL_COMMUNITY): Payer: Medicare Other

## 2012-08-29 DIAGNOSIS — R0989 Other specified symptoms and signs involving the circulatory and respiratory systems: Secondary | ICD-10-CM | POA: Insufficient documentation

## 2012-08-29 DIAGNOSIS — I714 Abdominal aortic aneurysm, without rupture, unspecified: Secondary | ICD-10-CM | POA: Insufficient documentation

## 2012-08-29 NOTE — Progress Notes (Signed)
Carotid Duplex Completed. [name] D  

## 2012-08-29 NOTE — Progress Notes (Signed)
Aorta Duplex Completed. Jerry Farley  

## 2012-12-06 ENCOUNTER — Other Ambulatory Visit (HOSPITAL_COMMUNITY): Payer: Self-pay | Admitting: Cardiovascular Disease

## 2012-12-06 DIAGNOSIS — I255 Ischemic cardiomyopathy: Secondary | ICD-10-CM

## 2013-01-24 HISTORY — PX: TRANSTHORACIC ECHOCARDIOGRAM: SHX275

## 2013-01-30 ENCOUNTER — Ambulatory Visit (HOSPITAL_COMMUNITY)
Admission: RE | Admit: 2013-01-30 | Discharge: 2013-01-30 | Disposition: A | Discharge status: Home / Self Care | Payer: Medicare Other | Source: Ambulatory Visit | Attending: Cardiology | Admitting: Cardiology

## 2013-01-30 DIAGNOSIS — I447 Left bundle-branch block, unspecified: Secondary | ICD-10-CM | POA: Insufficient documentation

## 2013-01-30 DIAGNOSIS — I517 Cardiomegaly: Secondary | ICD-10-CM | POA: Insufficient documentation

## 2013-01-30 DIAGNOSIS — I079 Rheumatic tricuspid valve disease, unspecified: Secondary | ICD-10-CM | POA: Insufficient documentation

## 2013-01-30 DIAGNOSIS — I359 Nonrheumatic aortic valve disorder, unspecified: Secondary | ICD-10-CM | POA: Insufficient documentation

## 2013-01-30 DIAGNOSIS — I509 Heart failure, unspecified: Secondary | ICD-10-CM | POA: Insufficient documentation

## 2013-01-30 DIAGNOSIS — I2510 Atherosclerotic heart disease of native coronary artery without angina pectoris: Secondary | ICD-10-CM | POA: Insufficient documentation

## 2013-01-30 DIAGNOSIS — I059 Rheumatic mitral valve disease, unspecified: Secondary | ICD-10-CM | POA: Insufficient documentation

## 2013-01-30 DIAGNOSIS — I255 Ischemic cardiomyopathy: Secondary | ICD-10-CM

## 2013-01-30 DIAGNOSIS — I2589 Other forms of chronic ischemic heart disease: Secondary | ICD-10-CM | POA: Insufficient documentation

## 2013-01-30 NOTE — Progress Notes (Signed)
2D Echo Performed 01/30/2013    Sanvi Ehler, RCS  

## 2013-02-07 ENCOUNTER — Telehealth: Payer: Self-pay | Admitting: Cardiovascular Disease

## 2013-02-07 NOTE — Telephone Encounter (Signed)
Pt. Called and test results given

## 2013-02-07 NOTE — Telephone Encounter (Signed)
Returning call from Sat-concerning test results!

## 2013-02-14 ENCOUNTER — Telehealth: Payer: Self-pay | Admitting: Cardiovascular Disease

## 2013-02-14 NOTE — Telephone Encounter (Signed)
Medication filled.  

## 2013-02-14 NOTE — Telephone Encounter (Signed)
J C he wants you take care of his refiils-been waiting a long time and still have not gotten it.

## 2013-03-05 LAB — PACEMAKER DEVICE OBSERVATION

## 2013-03-07 ENCOUNTER — Telehealth: Payer: Self-pay | Admitting: Cardiovascular Disease

## 2013-03-07 NOTE — Telephone Encounter (Signed)
Patient need refill on Levothyroxin 75 # 30--would like the pharmacy to hold this until Friday 03/10/13---[facility] in [place]

## 2013-03-07 NOTE — Telephone Encounter (Signed)
Returned call.  Asked pt if he has called his pharmacy for a refill.  Denied calling pharmacy.  Stated it took 2 weeks to fill the last time.  Pt informed of refill process and agreed to call pharmacy for refill.

## 2013-03-14 ENCOUNTER — Other Ambulatory Visit: Payer: Self-pay | Admitting: *Deleted

## 2013-03-14 ENCOUNTER — Telehealth: Payer: Self-pay | Admitting: Cardiovascular Disease

## 2013-03-14 NOTE — Telephone Encounter (Signed)
Returned call and informed pt refill request received today and is in the process of being completed.  Pt stated he doesn't believe it was just received today.  Pt informed refill will be completed.  RN apologized for inconvenience.

## 2013-03-14 NOTE — Telephone Encounter (Signed)
Patient called complaining about not being able to get his prescription for Levothroxin 75 mg.  Jerry Farley faxed the request 8/13; 8/15 and today.

## 2013-04-17 ENCOUNTER — Other Ambulatory Visit: Payer: Self-pay | Admitting: Cardiovascular Disease

## 2013-04-17 LAB — CBC WITH DIFFERENTIAL/PLATELET
Basophils Absolute: 0 10*3/uL (ref 0.0–0.1)
Eosinophils Absolute: 0.3 10*3/uL (ref 0.0–0.7)
Eosinophils Relative: 4 % (ref 0–5)
HCT: 40.6 % (ref 39.0–52.0)
Lymphocytes Relative: 29 % (ref 12–46)
MCH: 32 pg (ref 26.0–34.0)
MCHC: 34.2 g/dL (ref 30.0–36.0)
MCV: 93.3 fL (ref 78.0–100.0)
Monocytes Absolute: 0.7 10*3/uL (ref 0.1–1.0)
RDW: 13.8 % (ref 11.5–15.5)

## 2013-04-17 LAB — PACEMAKER DEVICE OBSERVATION

## 2013-04-17 LAB — TSH: TSH: 3.036 u[IU]/mL (ref 0.350–4.500)

## 2013-04-17 LAB — COMPREHENSIVE METABOLIC PANEL
AST: 25 U/L (ref 0–37)
BUN: 16 mg/dL (ref 6–23)
Calcium: 9.9 mg/dL (ref 8.4–10.5)
Chloride: 105 mEq/L (ref 96–112)
Creat: 1.19 mg/dL (ref 0.50–1.35)

## 2013-04-17 LAB — LIPID PANEL
Cholesterol: 135 mg/dL (ref 0–200)
HDL: 32 mg/dL — ABNORMAL LOW (ref >39)
Total CHOL/HDL Ratio: 4.2 Ratio

## 2013-04-20 ENCOUNTER — Encounter: Payer: Self-pay | Admitting: Cardiovascular Disease

## 2013-04-21 ENCOUNTER — Encounter: Payer: Self-pay | Admitting: Cardiovascular Disease

## 2013-04-24 ENCOUNTER — Telehealth: Payer: Self-pay | Admitting: *Deleted

## 2013-04-24 MED ORDER — AMIODARONE HCL 200 MG PO TABS: 200.0000 mg | ORAL_TABLET | Freq: Every day | ORAL | Status: DC

## 2013-04-24 NOTE — Telephone Encounter (Signed)
Jerry Farley stated pt's wife called and complained that they have been trying to get a refill on Pacerone for two weeks.  Informed refill request likely not received and that many of Dr. Kandis Cocking requests are being sent electronically and he is not using Epic.  Informed RN will refill Rx and call wife, Steward Drone.  Call to wife and talked w/ pt.  Pt c/o not being able to get refill.  Informed as stated above and apologized for inconvenience.  Pt also asked for fax number to give to pharmacy.  Fax number given and pt informed refill will be sent today.  Pt stated his dose was changed to 1 pill (200) mg daily and he would like 30-day supply.  Refill(s) sent to pharmacy via [facility]).

## 2013-05-21 LAB — ICD DEVICE OBSERVATION

## 2013-05-22 ENCOUNTER — Ambulatory Visit (INDEPENDENT_AMBULATORY_CARE_PROVIDER_SITE_OTHER): Payer: Medicare Other

## 2013-05-22 DIAGNOSIS — I5022 Chronic systolic (congestive) heart failure: Secondary | ICD-10-CM

## 2013-05-22 DIAGNOSIS — I472 Ventricular tachycardia, unspecified: Secondary | ICD-10-CM

## 2013-05-22 DIAGNOSIS — I4729 Other ventricular tachycardia: Secondary | ICD-10-CM

## 2013-05-22 DIAGNOSIS — I2589 Other forms of chronic ischemic heart disease: Secondary | ICD-10-CM

## 2013-05-22 DIAGNOSIS — I509 Heart failure, unspecified: Secondary | ICD-10-CM

## 2013-05-22 DIAGNOSIS — I255 Ischemic cardiomyopathy: Secondary | ICD-10-CM

## 2013-05-29 ENCOUNTER — Telehealth: Payer: Self-pay | Admitting: Internal Medicine

## 2013-05-29 NOTE — Telephone Encounter (Signed)
Pt has recall for Dr. Salena Saner in January/mt

## 2013-05-31 ENCOUNTER — Encounter: Payer: Self-pay | Admitting: *Deleted

## 2013-05-31 LAB — REMOTE ICD DEVICE
AL THRESHOLD: 0.5 V
BAMS-0001: 170 {beats}/min
BATTERY VOLTAGE: 3.1696 V
FVT: 0
HV IMPEDENCE: 75 Ohm
LV LEAD THRESHOLD: 1.125 V
PACEART VT: 0
RV LEAD AMPLITUDE: 7.25 mv
TZAT-0001ATACH: 2
TZAT-0001ATACH: 3
TZAT-0001FASTVT: 1
TZAT-0001SLOWVT: 1
TZAT-0001SLOWVT: 2
TZAT-0004SLOWVT: 8
TZAT-0004SLOWVT: 8
TZAT-0012ATACH: 150 ms
TZAT-0012ATACH: 150 ms
TZAT-0012ATACH: 150 ms
TZAT-0012FASTVT: 170 ms
TZAT-0012SLOWVT: 170 ms
TZAT-0018ATACH: NEGATIVE
TZAT-0018FASTVT: NEGATIVE
TZAT-0019ATACH: 6 V
TZAT-0019SLOWVT: 8 V
TZAT-0019SLOWVT: 8 V
TZAT-0020ATACH: 1.5 ms
TZAT-0020ATACH: 1.5 ms
TZAT-0020SLOWVT: 1.5 ms
TZAT-0020SLOWVT: 1.5 ms
TZON-0003ATACH: 350 ms
TZON-0003SLOWVT: 350 ms
TZON-0003VSLOWVT: 450 ms
TZON-0004VSLOWVT: 20
TZST-0001ATACH: 4
TZST-0001ATACH: 6
TZST-0001FASTVT: 2
TZST-0001FASTVT: 4
TZST-0001FASTVT: 5
TZST-0001FASTVT: 6
TZST-0001SLOWVT: 3
TZST-0001SLOWVT: 5
TZST-0002ATACH: NEGATIVE
TZST-0002ATACH: NEGATIVE
TZST-0002FASTVT: NEGATIVE
TZST-0003SLOWVT: 20 J
TZST-0003SLOWVT: 35 J

## 2013-06-23 ENCOUNTER — Ambulatory Visit (INDEPENDENT_AMBULATORY_CARE_PROVIDER_SITE_OTHER): Payer: Medicare Other | Admitting: *Deleted

## 2013-06-23 DIAGNOSIS — I4729 Other ventricular tachycardia: Secondary | ICD-10-CM

## 2013-06-23 DIAGNOSIS — I2589 Other forms of chronic ischemic heart disease: Secondary | ICD-10-CM

## 2013-06-23 DIAGNOSIS — I5022 Chronic systolic (congestive) heart failure: Secondary | ICD-10-CM

## 2013-06-23 DIAGNOSIS — I509 Heart failure, unspecified: Secondary | ICD-10-CM

## 2013-06-23 DIAGNOSIS — I472 Ventricular tachycardia, unspecified: Secondary | ICD-10-CM

## 2013-06-23 DIAGNOSIS — I255 Ischemic cardiomyopathy: Secondary | ICD-10-CM

## 2013-06-23 LAB — ICD DEVICE OBSERVATION

## 2013-07-11 ENCOUNTER — Encounter: Payer: Self-pay | Admitting: *Deleted

## 2013-07-11 LAB — MDC_IDC_ENUM_SESS_TYPE_REMOTE
Battery Voltage: 3.17 V
Brady Statistic AS VS Percent: 0.9 %
HighPow Impedance: 76 Ohm
Lead Channel Impedance Value: 418 Ohm
Lead Channel Impedance Value: 760 Ohm
Lead Channel Pacing Threshold Amplitude: 0.5 V
Lead Channel Pacing Threshold Amplitude: 1.25 V
Lead Channel Sensing Intrinsic Amplitude: 2.9 mV
Lead Channel Sensing Intrinsic Amplitude: 6.3 mV
Lead Channel Setting Pacing Amplitude: 1.5 V
Lead Channel Setting Pacing Amplitude: 2 V
Lead Channel Setting Pacing Amplitude: 2.25 V
Lead Channel Setting Pacing Pulse Width: 0.4 ms
Lead Channel Setting Pacing Pulse Width: 0.4 ms
Zone Setting Detection Interval: 350 ms
Zone Setting Detection Interval: 450 ms

## 2013-07-24 ENCOUNTER — Encounter: Payer: Medicare Other | Admitting: *Deleted

## 2013-08-21 ENCOUNTER — Encounter: Payer: Self-pay | Admitting: *Deleted

## 2013-08-22 ENCOUNTER — Ambulatory Visit (INDEPENDENT_AMBULATORY_CARE_PROVIDER_SITE_OTHER): Payer: Medicare PPO | Admitting: *Deleted

## 2013-08-22 ENCOUNTER — Ambulatory Visit (INDEPENDENT_AMBULATORY_CARE_PROVIDER_SITE_OTHER): Payer: Medicare PPO | Admitting: Physician Assistant

## 2013-08-22 ENCOUNTER — Encounter: Payer: Self-pay | Admitting: Physician Assistant

## 2013-08-22 ENCOUNTER — Ambulatory Visit: Payer: Medicare Other | Admitting: Cardiovascular Disease

## 2013-08-22 VITALS — BP 130/60 | HR 58 | Ht 68.0 in | Wt 220.0 lb

## 2013-08-22 DIAGNOSIS — I2589 Other forms of chronic ischemic heart disease: Secondary | ICD-10-CM

## 2013-08-22 DIAGNOSIS — I509 Heart failure, unspecified: Secondary | ICD-10-CM

## 2013-08-22 DIAGNOSIS — I5022 Chronic systolic (congestive) heart failure: Secondary | ICD-10-CM

## 2013-08-22 DIAGNOSIS — I255 Ischemic cardiomyopathy: Secondary | ICD-10-CM

## 2013-08-22 DIAGNOSIS — I2510 Atherosclerotic heart disease of native coronary artery without angina pectoris: Secondary | ICD-10-CM

## 2013-08-22 DIAGNOSIS — I472 Ventricular tachycardia, unspecified: Secondary | ICD-10-CM

## 2013-08-22 DIAGNOSIS — E669 Obesity, unspecified: Secondary | ICD-10-CM

## 2013-08-22 DIAGNOSIS — I4729 Other ventricular tachycardia: Secondary | ICD-10-CM

## 2013-08-22 DIAGNOSIS — Z95810 Presence of automatic (implantable) cardiac defibrillator: Secondary | ICD-10-CM | POA: Insufficient documentation

## 2013-08-22 HISTORY — DX: Obesity, unspecified: E66.9

## 2013-08-22 LAB — MDC_IDC_ENUM_SESS_TYPE_INCLINIC
Brady Statistic AP VS Percent: 0.1 % — CL
Brady Statistic AS VS Percent: 0.9 %
HighPow Impedance: 77 Ohm
Lead Channel Impedance Value: 570 Ohm
Lead Channel Pacing Threshold Pulse Width: 0.4 ms
Lead Channel Sensing Intrinsic Amplitude: 7.9 mV
Lead Channel Setting Pacing Amplitude: 1.5 V
Lead Channel Setting Pacing Pulse Width: 0.4 ms
Lead Channel Setting Pacing Pulse Width: 0.4 ms
Lead Channel Setting Sensing Sensitivity: 0.3 mV
MDC IDC MSMT BATTERY VOLTAGE: 3.14 V
MDC IDC MSMT LEADCHNL LV IMPEDANCE VALUE: 760 Ohm
MDC IDC MSMT LEADCHNL LV PACING THRESHOLD AMPLITUDE: 1.25 V
MDC IDC MSMT LEADCHNL LV PACING THRESHOLD PULSEWIDTH: 0.4 ms
MDC IDC MSMT LEADCHNL RA IMPEDANCE VALUE: 418 Ohm
MDC IDC MSMT LEADCHNL RA PACING THRESHOLD AMPLITUDE: 0.5 V
MDC IDC MSMT LEADCHNL RA PACING THRESHOLD PULSEWIDTH: 0.4 ms
MDC IDC MSMT LEADCHNL RA SENSING INTR AMPL: 3.8 mV
MDC IDC MSMT LEADCHNL RV PACING THRESHOLD AMPLITUDE: 0.75 V
MDC IDC SET LEADCHNL LV PACING AMPLITUDE: 2.25 V
MDC IDC SET LEADCHNL RV PACING AMPLITUDE: 2 V
MDC IDC SET ZONE DETECTION INTERVAL: 300 ms
MDC IDC SET ZONE DETECTION INTERVAL: 350 ms
MDC IDC SET ZONE DETECTION INTERVAL: 350 ms
MDC IDC STAT BRADY AP VP PERCENT: 2.7 %
MDC IDC STAT BRADY AS VP PERCENT: 96.3 %
Zone Setting Detection Interval: 450 ms

## 2013-08-22 LAB — ICD DEVICE OBSERVATION

## 2013-08-22 NOTE — Progress Notes (Signed)
Date:  08/22/2013   ID:  Jerry Farley, DOB [birth year], MRN [id number]  PCP:  Azzie Almas, MD  Primary Cardiologist:  Gwenlyn Found    History of Present Illness: Jerry Farley is a 68 y.o. 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   He has a history of ischemic cardiomyopathy, ICD generator change to Medtronic Protecta XT-DR CRT July 2585, chronic systolic heart failure, nonsustained ventricular tachycardia, coronary artery disease prior circumflex stenting in 2007, hyperlipidemia, hypothyroidism.  His last 2-D echocardiogram was 01/30/2013 showed an ejection fraction of 40-45% with moderate hypokinesis of the inferior myocardium, grade 1 diastolic dysfunction, mild mitral regurgitation.  Patient presents today for followup evaluation. Reports feeling quite well and currently denies nausea, vomiting, fever, chest pain, shortness of breath, orthopnea, dizziness, PND, cough, congestion, abdominal pain, hematochezia, melena, lower extremity edema, claudication.  He does report that he had exercises approximately 2-3 times per week by walking, however, his weight has increased a mildly because he is not eating a healthy diet.    Wt Readings from Last 3 Encounters:  08/22/13 220 lb (99.791 kg)  05/17/12 216 lb 9.6 oz (98.249 kg)  02/11/12 214 lb 8.1 oz (97.3 kg)     Past Medical History  Diagnosis Date  . NICM (nonischemic cardiomyopathy)     ejection fraction 35-40% by echo May 2012  . CAD (coronary artery disease)     prior circumflex stenting  . Ventricular tachycardia, non-sustained   . 6948-lead   . Chronic systolic congestive heart failure   . ICD (implantable cardiac defibrillator) in place     Medtronic Protecta XT-DR CRT (01/2012)  . Complication of anesthesia     "problems waking up w/valium"  . High cholesterol   . Shortness of breath 2005  . Hypothyroidism   . Arthritis     "maybe a little"    Current  Outpatient Prescriptions  Medication Sig Dispense Refill  . amiodarone (PACERONE) 200 MG tablet Take 1 tablet (200 mg total) by mouth daily.  30 tablet  11  . aspirin 81 MG tablet Take 81 mg by mouth daily.        Marland Kitchen atorvastatin (LIPITOR) 10 MG tablet Take 10 mg by mouth daily.        . carvedilol (COREG) 25 MG tablet Take 25 mg by mouth 2 (two) times daily with a meal.        . clopidogrel (PLAVIX) 75 MG tablet Take 75 mg by mouth daily.        Marland Kitchen ezetimibe (ZETIA) 10 MG tablet Take 10 mg by mouth daily.        . fish oil-omega-3 fatty acids 1000 MG capsule Take 1 g by mouth daily.       Marland Kitchen levothyroxine (SYNTHROID, LEVOTHROID) 75 MCG tablet Take 75 mcg by mouth daily before breakfast.      . Magnesium Oxide 400 MG CAPS Take 1 capsule by mouth.        . Multiple Vitamin (MULTIVITAMIN) tablet Take 1 tablet by mouth daily.        . ramipril (ALTACE) 10 MG capsule Take 10 mg by mouth daily.        Marland Kitchen spironolactone (ALDACTONE) 25 MG tablet Take 12.5 mg by mouth daily.       . you have a pacemaker book MISC 1 each by Does not apply route once.  1 each  0  No current facility-administered medications for this visit.    Allergies:   No Known Allergies  Social History:  The patient  reports that he quit smoking about 10 years ago. His smoking use included Cigarettes. He has a 63 pack-year smoking history. He has quit using smokeless tobacco. His smokeless tobacco use included Chew. He reports that he does not drink alcohol or use illicit drugs.   Family history:  No family history on file.  ROS:  Please see the history of present illness.  All other systems reviewed and negative.   PHYSICAL EXAM: VS:  BP 130/60  Pulse 58  Ht 5\' 8"  (1.727 m)  Wt 220 lb (99.791 kg)  BMI 33.46 kg/m2 Obese, well developed, in no acute distress HEENT: Pupils are equal round react to light accommodation extraocular movements are intact.  Neck: no JVDNo cervical lymphadenopathy. Cardiac: Regular rate and rhythm  without murmurs rubs or gallops. Lungs:  clear to auscultation bilaterally, no wheezing, rhonchi or rales Abd: soft, nontender, positive bowel sounds all quadrants, no hepatosplenomegaly Ext: no lower extremity edema.  2+ radial and dorsalis pedis pulses. Skin: warm and dry Neuro:  Grossly normal  EKG:  V pacing  64 beats per minute  ASSESSMENT AND PLAN:  Problem List Items Addressed This Visit   CAD (coronary artery disease) - Primary     Patient is doing well with no complaints of angina symptoms. He is fairly inactive states that he essentially just lays around this time year with the exception of walking short distances 2-3 times per week.  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    Relevant Orders      EKG 01-SWFU   Chronic systolic congestive heart failure     Patient is well compensated with no signs of acute failure this time.  His weight is up approximately 4 pounds since October of 2013 but this appears to be fat and not fluid    Cardiac defibrillator CRT-medtronic     Patient device is currently functioning within normal limits. Bi-V pacing.    Ischemic cardiomyopathy   Obesity (BMI 30-39.9)     I offered the patient referral for medical nutrition therapy to a registered dietitian.    RESOLVED: ICD (implantable cardioverter-defibrillator) in place

## 2013-08-22 NOTE — Assessment & Plan Note (Addendum)
Patient is well compensated with no signs of acute failure this time.  His weight is up approximately 4 pounds since October of 2013 but this appears to be fat and not fluid

## 2013-08-22 NOTE — Assessment & Plan Note (Addendum)
Patient is doing well with no complaints of angina symptoms. He is fairly inactive states that he essentially just lays around this time year with the exception of walking short distances 2-3 times per week.  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

## 2013-08-22 NOTE — Assessment & Plan Note (Signed)
Patient device is currently functioning within normal limits. Bi-V pacing. 

## 2013-08-22 NOTE — Assessment & Plan Note (Signed)
I offered the patient referral for medical nutrition therapy to a registered dietitian.

## 2013-08-22 NOTE — Assessment & Plan Note (Deleted)
Patient device is currently functioning within normal limits. Bi-V pacing.

## 2013-08-25 NOTE — Progress Notes (Signed)
CRT-D device check in office with Tarri Fuller, PA. Thresholds and sensing consistent with previous device measurements. Lead impedance trends stable over time. No mode switch episodes recorded. No ventricular arrhythmia episodes recorded. Patient bi-ventricularly pacing 98.9% of the time with 0.8% as VSRp. RA/RV outputs increased to 2V/2.5V for greater safety margins. Heart failure diagnostics reviewed and trends are stable for patient. Audible alerts demonstrated for patient. No changes made this session. Batt voltage 3.14V (ERI 2.63V).  Patient will follow up with SK in 3 months. Patient education completed including shock plan.

## 2013-09-08 ENCOUNTER — Other Ambulatory Visit: Payer: Self-pay | Admitting: *Deleted

## 2013-09-08 ENCOUNTER — Telehealth: Payer: Self-pay | Admitting: Cardiovascular Disease

## 2013-09-08 MED ORDER — EZETIMIBE 10 MG PO TABS: 10.0000 mg | ORAL_TABLET | Freq: Every day | ORAL | Status: DC

## 2013-09-08 NOTE — Telephone Encounter (Signed)
Need refill on Zetia 10 mg #30.

## 2013-09-08 NOTE — Telephone Encounter (Signed)
Refills sent to pharmacy. 

## 2013-09-11 ENCOUNTER — Other Ambulatory Visit: Payer: Self-pay | Admitting: *Deleted

## 2013-09-13 ENCOUNTER — Encounter: Payer: Self-pay | Admitting: *Deleted

## 2013-10-03 ENCOUNTER — Other Ambulatory Visit: Payer: Self-pay

## 2013-10-03 MED ORDER — SPIRONOLACTONE 25 MG PO TABS: 12.5000 mg | ORAL_TABLET | Freq: Every day | ORAL | Status: DC

## 2013-10-03 NOTE — Telephone Encounter (Signed)
Rx was sent to pharmacy electronically. 

## 2013-11-24 ENCOUNTER — Encounter: Payer: Self-pay | Admitting: Internal Medicine

## 2013-11-24 ENCOUNTER — Ambulatory Visit (INDEPENDENT_AMBULATORY_CARE_PROVIDER_SITE_OTHER): Payer: Commercial Managed Care - HMO | Admitting: Internal Medicine

## 2013-11-24 VITALS — BP 119/69 | HR 75 | Ht 66.0 in | Wt 216.1 lb

## 2013-11-24 DIAGNOSIS — I2589 Other forms of chronic ischemic heart disease: Secondary | ICD-10-CM

## 2013-11-24 DIAGNOSIS — I472 Ventricular tachycardia, unspecified: Secondary | ICD-10-CM | POA: Diagnosis not present

## 2013-11-24 DIAGNOSIS — I5022 Chronic systolic (congestive) heart failure: Secondary | ICD-10-CM | POA: Diagnosis not present

## 2013-11-24 DIAGNOSIS — Z95810 Presence of automatic (implantable) cardiac defibrillator: Secondary | ICD-10-CM | POA: Diagnosis not present

## 2013-11-24 DIAGNOSIS — I509 Heart failure, unspecified: Secondary | ICD-10-CM

## 2013-11-24 DIAGNOSIS — I4729 Other ventricular tachycardia: Secondary | ICD-10-CM

## 2013-11-24 DIAGNOSIS — I255 Ischemic cardiomyopathy: Secondary | ICD-10-CM

## 2013-11-24 LAB — MDC_IDC_ENUM_SESS_TYPE_INCLINIC
Battery Voltage: 3.15 V
Brady Statistic RA Percent Paced: 3.63 %
HIGH POWER IMPEDANCE MEASURED VALUE: 209 Ohm
HIGH POWER IMPEDANCE MEASURED VALUE: 494 Ohm
HIGH POWER IMPEDANCE MEASURED VALUE: 76 Ohm
Lead Channel Impedance Value: 589 Ohm
Lead Channel Pacing Threshold Amplitude: 0.75 V
Lead Channel Pacing Threshold Amplitude: 1.375 V
Lead Channel Pacing Threshold Pulse Width: 0.4 ms
Lead Channel Pacing Threshold Pulse Width: 0.4 ms
Lead Channel Sensing Intrinsic Amplitude: 3.125 mV
Lead Channel Sensing Intrinsic Amplitude: 6.25 mV
Lead Channel Sensing Intrinsic Amplitude: 6.375 mV
Lead Channel Setting Pacing Amplitude: 2 V
Lead Channel Setting Pacing Amplitude: 2.5 V
Lead Channel Setting Pacing Pulse Width: 0.4 ms
MDC IDC MSMT LEADCHNL LV IMPEDANCE VALUE: 494 Ohm
MDC IDC MSMT LEADCHNL LV IMPEDANCE VALUE: 570 Ohm
MDC IDC MSMT LEADCHNL LV IMPEDANCE VALUE: 874 Ohm
MDC IDC MSMT LEADCHNL RA IMPEDANCE VALUE: 418 Ohm
MDC IDC MSMT LEADCHNL RA PACING THRESHOLD AMPLITUDE: 0.625 V
MDC IDC MSMT LEADCHNL RA PACING THRESHOLD PULSEWIDTH: 0.4 ms
MDC IDC MSMT LEADCHNL RA SENSING INTR AMPL: 3.25 mV
MDC IDC SESS DTM: 20150501105831
MDC IDC SET LEADCHNL RV PACING AMPLITUDE: 2 V
MDC IDC SET LEADCHNL RV PACING PULSEWIDTH: 0.4 ms
MDC IDC SET LEADCHNL RV SENSING SENSITIVITY: 0.3 mV
MDC IDC SET ZONE DETECTION INTERVAL: 350 ms
MDC IDC STAT BRADY AP VP PERCENT: 3.61 %
MDC IDC STAT BRADY AP VS PERCENT: 0.02 %
MDC IDC STAT BRADY AS VP PERCENT: 95.02 %
MDC IDC STAT BRADY AS VS PERCENT: 1.35 %
MDC IDC STAT BRADY RV PERCENT PACED: 98.63 %
Zone Setting Detection Interval: 300 ms
Zone Setting Detection Interval: 350 ms
Zone Setting Detection Interval: 450 ms

## 2013-11-24 LAB — COMPREHENSIVE METABOLIC PANEL
ALBUMIN: 4.1 g/dL (ref 3.5–5.2)
ALK PHOS: 39 U/L (ref 39–117)
ALT: 38 U/L (ref 0–53)
AST: 27 U/L (ref 0–37)
BILIRUBIN TOTAL: 0.5 mg/dL (ref 0.3–1.2)
BUN: 11 mg/dL (ref 6–23)
CO2: 27 mEq/L (ref 19–32)
Calcium: 9.4 mg/dL (ref 8.4–10.5)
Chloride: 104 mEq/L (ref 96–112)
Creatinine, Ser: 1.2 mg/dL (ref 0.4–1.5)
GFR: 66.54 mL/min (ref >60.00)
GLUCOSE: 106 mg/dL — AB (ref 70–99)
POTASSIUM: 4.7 meq/L (ref 3.5–5.1)
SODIUM: 139 meq/L (ref 135–145)
TOTAL PROTEIN: 6.9 g/dL (ref 6.0–8.3)

## 2013-11-24 LAB — TSH: TSH: 1.77 u[IU]/mL (ref 0.35–5.50)

## 2013-11-24 NOTE — Patient Instructions (Addendum)
Your physician recommends that you return for lab work today: CMET, TSH  Your physician recommends that you continue on your current medications as directed. Please refer to the Current Medication list given to you today.  Remote monitoring is used to monitor your Pacemaker of ICD from home. This monitoring reduces the number of office visits required to check your device to one time per year. It allows [place] to keep an eye on the functioning of your device to ensure it is working properly. You are scheduled for a device check from home on 02/26/14. You may send your transmission at any time that day. If you have a wireless device, the transmission will be sent automatically. After your physician reviews your transmission, you will receive a postcard with your next transmission date.  Your physician wants you to follow-up in: 1 year with Dr. Caryl Comes.  You will receive a reminder letter in the mail two months in advance. If you don't receive a letter, please call our office to schedule the follow-up appointment.

## 2013-11-24 NOTE — Progress Notes (Signed)
Patient Care Team: Azzie Almas, MD as PCP - General (Family Medicine)   HPI  Jerry Farley is a 68 y.o. male Seen in followup for ICD implanted to remotely for which he underwent device generator replacement 7/13. Indication was polymorphic ventricular tachycardia for which he took amiodarone. This is occurring in the context of nonischemic cardiomyopathy. He also has a history of a 6949-lead which was revised the time of his procedure. He also underwent CRT upgrade of atrial lead and an LV lead.  The patient denies chest pain, shortness of breath, nocturnal dyspnea, orthopnea or peripheral edema. There have been no palpitations, lightheadedness or syncope.    Past Medical History  Diagnosis Date  . NICM (nonischemic cardiomyopathy)     ejection fraction 35-40% by echo May 2012  . CAD (coronary artery disease)     prior circumflex stenting  . Ventricular tachycardia, non-sustained   . 6948-lead   . Chronic systolic congestive heart failure   . ICD (implantable cardiac defibrillator) in place     Medtronic Protecta XT-DR CRT (01/2012)  . Complication of anesthesia     "problems waking up w/valium"  . High cholesterol   . Shortness of breath 2005  . Hypothyroidism   . Arthritis     "maybe a little"    Past Surgical History  Procedure Laterality Date  . Cardiac defibrillator placement  09/2004; 02/10/12    ICD implanted in 2006 (Dr. Rito Ehrlich); EOL generator change in 01/2012 - CRT (BiV ICD) (Dr. Caryl Comes)  . Tonsillectomy and adenoidectomy      "when I was a child"  . Coronary angioplasty with stent placement  2005; 01/2006; 03/2006    "1 +1 +1 "  . Cardiac catheterization  01/28/2006    diagnostic cath 01/18/13 - DES of Cfx with 2.5x79mm Cypher (Dr. Domenic Moras)  . Transthoracic echocardiogram  01/2013    EF 40-45%, LV mildly dilated, mild LVH, mod hypokinesis of inferior myocardium, grade 1 diastolic dysfunction; mild MR  . Nm myocar perf ejection fraction  05/2010   dipyridamole myoview; mod perfusion defect in apical, basal inferior, mid inferior & apical inferior regions (infarct/scar); post-stress 28%, low risk scan  . Cardiac catheterization  11/30/2001    non-ischemic cardiomyopahty with high LV end-diastolic pressure; 06% distal L main stenosis, large osital stenosis 70%, 70% Cfx stenosis (Dr. Domenic Moras)    Current Outpatient Prescriptions  Medication Sig Dispense Refill  . amiodarone (PACERONE) 200 MG tablet Take 1 tablet (200 mg total) by mouth daily.  30 tablet  11  . aspirin 81 MG tablet Take 81 mg by mouth daily.        Marland Kitchen atorvastatin (LIPITOR) 10 MG tablet Take 10 mg by mouth daily.        . carvedilol (COREG) 25 MG tablet Take 25 mg by mouth 2 (two) times daily with a meal.        . clopidogrel (PLAVIX) 75 MG tablet Take 75 mg by mouth daily.        Marland Kitchen ezetimibe (ZETIA) 10 MG tablet Take 1 tablet (10 mg total) by mouth daily.  30 tablet  4  . fish oil-omega-3 fatty acids 1000 MG capsule Take 1 g by mouth 3 (three) times a week.       . levothyroxine (SYNTHROID, LEVOTHROID) 75 MCG tablet Take 75 mcg by mouth daily before breakfast.      . Magnesium Oxide 400 MG CAPS Take 1 capsule by mouth.        Marland Kitchen  Multiple Vitamin (MULTIVITAMIN) tablet Take 1 tablet by mouth daily.        . ramipril (ALTACE) 10 MG capsule Take 10 mg by mouth daily.        Marland Kitchen spironolactone (ALDACTONE) 25 MG tablet Take 0.5 tablets (12.5 mg total) by mouth daily.  15 tablet  10  . you have a pacemaker book MISC 1 each by Does not apply route once.  1 each  0   No current facility-administered medications for this visit.    No Known Allergies  Review of Systems negative except from HPI and PMH  Physical Exam BP 119/69  Pulse 75  Ht 5\' 6"  (1.676 m)  Wt 216 lb 1.9 oz (98.031 kg)  BMI 34.90 kg/m2 Well developed and well nourished in no acute distress HENT normal E scleral and icterus clear Neck Supple JVP flat; carotids brisk and full Clear to ausculation  Regular  rate and rhythm, no murmurs gallops or rub Soft with active bowel sounds No clubbing cyanosis uterine tingling Edema Alert and oriented, grossly normal motor and sensory function Skin Warm and Dry    Assessment and  Plan  Polymorphic ventricular tachycardia  Nonischemic cardiomyopathy  AIVR  Cough  Amiodarone  CRT-[employer]  We have reprogrammed his device to increase his lower rate limit from 50--70 to overdrive AIVR  I will be concerned about the cough is a can be a manifestation of amiodarone lung injury. There has been no intercurrent ventricular tachycardia. We'll check amiodarone surveillance laboratories today. Euvolemic and we'll continue him on current medications

## 2013-11-28 ENCOUNTER — Other Ambulatory Visit: Payer: Self-pay | Admitting: *Deleted

## 2013-11-28 MED ORDER — ATORVASTATIN CALCIUM 10 MG PO TABS: 10.0000 mg | ORAL_TABLET | Freq: Every day | ORAL | Status: DC

## 2013-11-28 MED ORDER — CLOPIDOGREL BISULFATE 75 MG PO TABS: 75.0000 mg | ORAL_TABLET | Freq: Every day | ORAL | Status: DC

## 2013-11-28 MED ORDER — RAMIPRIL 10 MG PO CAPS: 10.0000 mg | ORAL_CAPSULE | Freq: Every day | ORAL | Status: DC

## 2013-11-28 NOTE — Telephone Encounter (Signed)
Rx refill sent to patient pharmacy   

## 2014-01-01 ENCOUNTER — Other Ambulatory Visit: Payer: Self-pay | Admitting: *Deleted

## 2014-01-01 MED ORDER — CARVEDILOL 25 MG PO TABS: 25.0000 mg | ORAL_TABLET | Freq: Two times a day (BID) | ORAL | Status: DC

## 2014-01-24 ENCOUNTER — Other Ambulatory Visit: Payer: Self-pay | Admitting: Physician Assistant

## 2014-01-24 NOTE — Telephone Encounter (Signed)
Rx was sent to pharmacy electronically. 

## 2014-02-20 ENCOUNTER — Encounter: Payer: Self-pay | Admitting: Cardiology

## 2014-02-20 ENCOUNTER — Ambulatory Visit (INDEPENDENT_AMBULATORY_CARE_PROVIDER_SITE_OTHER): Payer: Commercial Managed Care - HMO | Admitting: Cardiology

## 2014-02-20 VITALS — BP 110/62 | HR 70 | Ht 66.0 in | Wt 214.3 lb

## 2014-02-20 DIAGNOSIS — R5381 Other malaise: Secondary | ICD-10-CM

## 2014-02-20 DIAGNOSIS — E785 Hyperlipidemia, unspecified: Secondary | ICD-10-CM

## 2014-02-20 DIAGNOSIS — R5383 Other fatigue: Secondary | ICD-10-CM

## 2014-02-20 DIAGNOSIS — Z79899 Other long term (current) drug therapy: Secondary | ICD-10-CM

## 2014-02-20 LAB — COMPREHENSIVE METABOLIC PANEL
ALT: 35 U/L (ref 0–53)
AST: 24 U/L (ref 0–37)
Albumin: 4.4 g/dL (ref 3.5–5.2)
Alkaline Phosphatase: 36 U/L — ABNORMAL LOW (ref 39–117)
BUN: 16 mg/dL (ref 6–23)
CO2: 30 mEq/L (ref 19–32)
Calcium: 9.6 mg/dL (ref 8.4–10.5)
Chloride: 102 mEq/L (ref 96–112)
Creat: 1.23 mg/dL (ref 0.50–1.35)
Glucose, Bld: 112 mg/dL — ABNORMAL HIGH (ref 70–99)
Potassium: 4.8 mEq/L (ref 3.5–5.3)
Sodium: 138 mEq/L (ref 135–145)
Total Bilirubin: 0.6 mg/dL (ref 0.2–1.2)
Total Protein: 6.9 g/dL (ref 6.0–8.3)

## 2014-02-20 LAB — CBC
HCT: 42.4 % (ref 39.0–52.0)
Hemoglobin: 14.3 g/dL (ref 13.0–17.0)
MCH: 31.6 pg (ref 26.0–34.0)
MCHC: 33.7 g/dL (ref 30.0–36.0)
MCV: 93.8 fL (ref 78.0–100.0)
Platelets: 196 10*3/uL (ref 150–400)
RBC: 4.52 MIL/uL (ref 4.22–5.81)
RDW: 14.3 % (ref 11.5–15.5)
WBC: 7.7 10*3/uL (ref 4.0–10.5)

## 2014-02-20 LAB — TSH: TSH: 4.533 u[IU]/mL — ABNORMAL HIGH (ref 0.350–4.500)

## 2014-02-20 MED ORDER — LEVOTHYROXINE SODIUM 75 MCG PO TABS: 75.0000 ug | ORAL_TABLET | Freq: Every day | ORAL | Status: DC

## 2014-02-20 NOTE — Assessment & Plan Note (Signed)
prior circumflex stenting low risk Nuc 2011

## 2014-02-20 NOTE — Patient Instructions (Signed)
Your physician recommends that you schedule a follow-up appointment in: 12 Months  Your physician recommends that you return for lab work Today CMP, CBC, TSH

## 2014-02-20 NOTE — Assessment & Plan Note (Signed)
ICD implanted 2006, MDT BiV ICD uprgrade 2013

## 2014-02-20 NOTE — Assessment & Plan Note (Signed)
No recurrent ventricular tachycardia on amiodarone being followed by Jerry Farley

## 2014-02-20 NOTE — Progress Notes (Signed)
02/20/2014 Jerry Farley   Jan 07, 1946  [id number]  Primary Physicia Lottie Mussel, MD Primary Cardiologist: Dr Caryl Comes  HPI:  Jerry Farley is a 68 year old gentleman with long-standing history of cardiomyopathy, felt to be combined nonischemic/ischemic. In 2006 he underwent ICD implantation by Dr. Mitzi Davenport - single chamber Medtronic ICD with [phone] defibrillator lead. The following year he was hospitalized for polymorphic ventricular tachycardia. This was associated with multiple ICD discharges. He underwent catheterization in [place] and was found to have a 70% RCA lesion that was treated medically. The previously placed circumflex stent was patent. He was started on amiodarone for treatment of VT. His last ischemic evaluation was a Myoview scan in November 2011 which demonstrated some scar but no significant ischemia.  He was seen in follow-up by Dr. Caryl Comes.            On 02/10/2012, he underwent BiV ICD upgrade for CRT. He has done well since with no CHF. An echo in July 2014 revealed an EF of 40-45%. He is in the office today for a routine check. He denies chest pain, SOB, edema, or orthopnea.     Current Outpatient Prescriptions  Medication Sig Dispense Refill  . amiodarone (PACERONE) 200 MG tablet Take 1 tablet (200 mg total) by mouth daily.  30 tablet  11  . aspirin 81 MG tablet Take 81 mg by mouth daily.        Marland Kitchen atorvastatin (LIPITOR) 10 MG tablet Take 1 tablet (10 mg total) by mouth daily.  30 tablet  6  . carvedilol (COREG) 25 MG tablet Take 1 tablet (25 mg total) by mouth 2 (two) times daily with a meal.  60 tablet  9  . clopidogrel (PLAVIX) 75 MG tablet Take 1 tablet (75 mg total) by mouth daily.  30 tablet  6  . ezetimibe (ZETIA) 10 MG tablet Take 1 tablet (10 mg total) by mouth daily.  30 tablet  4  . fish oil-omega-3 fatty acids 1000 MG capsule Take 1 g by mouth 3 (three) times a week.       . levothyroxine (SYNTHROID, LEVOTHROID) 75 MCG tablet Take 1 tablet (75 mcg total) by  mouth daily before breakfast.  30 tablet  11  . Magnesium Oxide 400 MG CAPS Take 1 capsule by mouth.        . Multiple Vitamin (MULTIVITAMIN) tablet Take 1 tablet by mouth daily.        . ramipril (ALTACE) 10 MG capsule Take 1 capsule (10 mg total) by mouth daily.  30 capsule  6  . spironolactone (ALDACTONE) 25 MG tablet Take 0.5 tablets (12.5 mg total) by mouth daily.  15 tablet  10  . you have a pacemaker book MISC 1 each by Does not apply route once.  1 each  0   No current facility-administered medications for this visit.    No Known Allergies  History   Social History  . Marital Status: Married    Spouse Name: N/A    Number of Children: N/A  . Years of Education: N/A   Occupational History  . retired -- [employer]    Social History Main Topics  . Smoking status: Former Smoker -- 1.50 packs/day for 42 years    Types: Cigarettes    Quit date: 07/28/2003  . Smokeless tobacco: Former Systems developer    Types: Chew     Comment: "didn't chew but a year or 2; don't know when I quit"  .  Alcohol Use: No  . Drug Use: No  . Sexual Activity: No   Other Topics Concern  . Not on file   Social History Narrative  . No narrative on file     Review of Systems: General: negative for chills, fever, night sweats or weight changes.  Cardiovascular: negative for chest pain, dyspnea on exertion, edema, orthopnea, palpitations, paroxysmal nocturnal dyspnea or shortness of breath Dermatological: negative for rash Respiratory: negative for cough or wheezing Urologic: negative for hematuria Abdominal: negative for nausea, vomiting, diarrhea, bright red blood per rectum, melena, or hematemesis Neurologic: negative for visual changes, syncope, or dizziness All other systems reviewed and are otherwise negative except as noted above.    Blood pressure 110/62, pulse 70, height 5\' 6"  (1.676 m), weight 214 lb 4.8 oz (97.206 kg).  General appearance: alert, cooperative, no distress and moderately  obese Neck: no carotid bruit and no JVD Lungs: clear to auscultation bilaterally Heart: regular rate and rhythm Abdomen: obese Extremities: extremities normal, atraumatic, no cyanosis or edema Pulses: 2+ and symmetric Skin: Skin color, texture, turgor normal. No rashes or lesions Neurologic: Grossly normal  EKG AV paced  ASSESSMENT AND PLAN:   Non-ischemic cardiomyopathy- EF 40-45% 2D 7/14 No CHF symptoms  Ventricular tachycardia-polymorphic No recurrent ventricular tachycardia on amiodarone being followed by Caryl Comes  Cardiac defibrillator CRT-medtronic ICD implanted 2006, MDT BiV ICD uprgrade 2013  CAD (coronary artery disease) prior circumflex stenting low risk Nuc 2011   PLAN  Mr Birdwell asked that we refill his Thyroxin Rx. He says it was prescribed here 10 yrs ago and we have always refilled it here. I ordered a CMet, CBC, and TSH today. He is to see Dr Caryl Comes in November and can follow up here 6 mos after that. He knows he can contact us if he has any problems and be seen sooner if needed.   Jerry Farley KPA-C 02/20/2014 8:21 AM

## 2014-02-20 NOTE — Assessment & Plan Note (Signed)
No CHF symptoms

## 2014-02-21 ENCOUNTER — Telehealth: Payer: Self-pay | Admitting: *Deleted

## 2014-02-21 NOTE — Telephone Encounter (Signed)
Message copied by Vennie Homans on Wed Feb 21, 2014  5:02 PM ------      Message from: Erlene Quan      Created: Wed Feb 21, 2014 10:31 AM       Please let pt know his labs look OK. TSH should be repeated in 6 months. Please make sure his PCP gets a copy of his labs.            Kerin Ransom PA-C      02/21/2014      10:31 AM       ------

## 2014-02-21 NOTE — Telephone Encounter (Signed)
Left message for patient to return call for blood work result.

## 2014-02-26 ENCOUNTER — Ambulatory Visit (INDEPENDENT_AMBULATORY_CARE_PROVIDER_SITE_OTHER): Payer: Commercial Managed Care - HMO | Admitting: *Deleted

## 2014-02-26 DIAGNOSIS — I4729 Other ventricular tachycardia: Secondary | ICD-10-CM

## 2014-02-26 DIAGNOSIS — I472 Ventricular tachycardia: Secondary | ICD-10-CM

## 2014-02-26 DIAGNOSIS — I5022 Chronic systolic (congestive) heart failure: Secondary | ICD-10-CM

## 2014-02-27 ENCOUNTER — Telehealth: Payer: Self-pay | Admitting: Cardiology

## 2014-02-27 DIAGNOSIS — I5022 Chronic systolic (congestive) heart failure: Secondary | ICD-10-CM

## 2014-02-27 DIAGNOSIS — I472 Ventricular tachycardia: Secondary | ICD-10-CM

## 2014-02-27 DIAGNOSIS — I4729 Other ventricular tachycardia: Secondary | ICD-10-CM

## 2014-02-27 DIAGNOSIS — I509 Heart failure, unspecified: Secondary | ICD-10-CM

## 2014-02-27 LAB — MDC_IDC_ENUM_SESS_TYPE_REMOTE
Battery Voltage: 3.12 V
Brady Statistic AP VP Percent: 79.79 %
Brady Statistic AS VP Percent: 18.68 %
Brady Statistic RA Percent Paced: 80.15 %
Brady Statistic RV Percent Paced: 98.47 %
HighPow Impedance: 209 Ohm
HighPow Impedance: 475 Ohm
HighPow Impedance: 75 Ohm
Lead Channel Pacing Threshold Amplitude: 0.75 V
Lead Channel Pacing Threshold Pulse Width: 0.4 ms
Lead Channel Sensing Intrinsic Amplitude: 2.3 mV
Lead Channel Sensing Intrinsic Amplitude: 5.5 mV
Lead Channel Setting Pacing Amplitude: 2 V
Lead Channel Setting Pacing Amplitude: 2 V
Lead Channel Setting Pacing Amplitude: 2.5 V
Lead Channel Setting Pacing Pulse Width: 0.4 ms
MDC IDC MSMT LEADCHNL LV IMPEDANCE VALUE: 342 Ohm
MDC IDC MSMT LEADCHNL LV IMPEDANCE VALUE: 551 Ohm
MDC IDC MSMT LEADCHNL LV IMPEDANCE VALUE: 855 Ohm
MDC IDC MSMT LEADCHNL LV PACING THRESHOLD AMPLITUDE: 1.375 V
MDC IDC MSMT LEADCHNL LV PACING THRESHOLD PULSEWIDTH: 0.4 ms
MDC IDC MSMT LEADCHNL RA IMPEDANCE VALUE: 437 Ohm
MDC IDC MSMT LEADCHNL RV IMPEDANCE VALUE: 570 Ohm
MDC IDC MSMT LEADCHNL RV SENSING INTR AMPL: 5.5 mV
MDC IDC SESS DTM: 20150804181941
MDC IDC SET LEADCHNL RV PACING PULSEWIDTH: 0.4 ms
MDC IDC SET LEADCHNL RV SENSING SENSITIVITY: 0.3 mV
MDC IDC SET ZONE DETECTION INTERVAL: 350 ms
MDC IDC STAT BRADY AP VS PERCENT: 0.35 %
MDC IDC STAT BRADY AS VS PERCENT: 1.17 %
Zone Setting Detection Interval: 300 ms
Zone Setting Detection Interval: 350 ms
Zone Setting Detection Interval: 450 ms

## 2014-02-27 NOTE — Progress Notes (Signed)
Remote ICD transmission.   

## 2014-02-27 NOTE — Telephone Encounter (Signed)
Spoke with pt and reminded pt of remote transmission that is due today. Pt verbalized understanding.   

## 2014-03-01 ENCOUNTER — Encounter: Payer: Self-pay | Admitting: *Deleted

## 2014-03-20 ENCOUNTER — Encounter: Payer: Self-pay | Admitting: Cardiology

## 2014-03-29 ENCOUNTER — Encounter: Payer: Self-pay | Admitting: Cardiovascular Disease

## 2014-04-13 ENCOUNTER — Telehealth: Payer: Self-pay | Admitting: Cardiovascular Disease

## 2014-04-13 NOTE — Telephone Encounter (Signed)
Pt called in wanting to know what was done on his office visit on 1/27 because he is saying that he has a bill reflecting a charge for an x-ray that was done and he doesn't remember that happening. He would like to speak to a  Nurse for clarity. Please call  thanks

## 2014-04-13 NOTE — Telephone Encounter (Signed)
Returning a call from [place]

## 2014-04-13 NOTE — Telephone Encounter (Signed)
Duplicate message created and documented on. This encounter will be closed.

## 2014-04-13 NOTE — Telephone Encounter (Signed)
Spoke with patient. Informed him there is no record of a CXR from 08/22/2013 nor was this ordered at [place] with Tarri Fuller, PA on that date. Offered patient a number to the [facility] billing department and he declined this.

## 2014-04-13 NOTE — Telephone Encounter (Signed)
Pt would like to know what was exactly done on his office visit1-27-15 please. If not there,he wants you to leave it on his answering machine. I told him I was not sure if you could leave that information on the machine.

## 2014-04-13 NOTE — Telephone Encounter (Signed)
LM for patient to return call.

## 2014-04-13 NOTE — Telephone Encounter (Signed)
LMTCB

## 2014-04-30 ENCOUNTER — Telehealth: Payer: Self-pay | Admitting: Cardiovascular Disease

## 2014-04-30 MED ORDER — AMIODARONE HCL 200 MG PO TABS: 200.0000 mg | ORAL_TABLET | Freq: Every day | ORAL | Status: DC

## 2014-04-30 NOTE — Telephone Encounter (Signed)
Amiodarone refilled electronically.  Called [facility] to verify they received the refill.  THEY DID.

## 2014-04-30 NOTE — Telephone Encounter (Signed)
Pt called in stating that [facility] said that they faxed over a prescription refill request on 9/29 and our office had not responded. He is now out of this medication and would like it refilled. Please call  Thanks

## 2014-05-29 ENCOUNTER — Encounter: Payer: Self-pay | Admitting: Internal Medicine

## 2014-05-29 ENCOUNTER — Ambulatory Visit (INDEPENDENT_AMBULATORY_CARE_PROVIDER_SITE_OTHER): Payer: Commercial Managed Care - HMO | Admitting: Internal Medicine

## 2014-05-29 VITALS — BP 84/62 | HR 72 | Ht 66.0 in | Wt 222.4 lb

## 2014-05-29 DIAGNOSIS — R059 Cough, unspecified: Secondary | ICD-10-CM

## 2014-05-29 DIAGNOSIS — I952 Hypotension due to drugs: Secondary | ICD-10-CM

## 2014-05-29 DIAGNOSIS — I472 Ventricular tachycardia, unspecified: Secondary | ICD-10-CM

## 2014-05-29 DIAGNOSIS — Z4502 Encounter for adjustment and management of automatic implantable cardiac defibrillator: Secondary | ICD-10-CM

## 2014-05-29 DIAGNOSIS — I5022 Chronic systolic (congestive) heart failure: Secondary | ICD-10-CM

## 2014-05-29 DIAGNOSIS — R05 Cough: Secondary | ICD-10-CM

## 2014-05-29 DIAGNOSIS — I429 Cardiomyopathy, unspecified: Secondary | ICD-10-CM

## 2014-05-29 DIAGNOSIS — I428 Other cardiomyopathies: Secondary | ICD-10-CM

## 2014-05-29 LAB — MDC_IDC_ENUM_SESS_TYPE_INCLINIC
Brady Statistic AP VS Percent: 0.24 %
Brady Statistic RA Percent Paced: 80.59 %
Brady Statistic RV Percent Paced: 98.81 %
Date Time Interrogation Session: 20151103100001
HIGH POWER IMPEDANCE MEASURED VALUE: 228 Ohm
HighPow Impedance: 475 Ohm
HighPow Impedance: 72 Ohm
Lead Channel Impedance Value: 855 Ohm
Lead Channel Pacing Threshold Pulse Width: 0.4 ms
Lead Channel Pacing Threshold Pulse Width: 0.4 ms
Lead Channel Sensing Intrinsic Amplitude: 2 mV
Lead Channel Sensing Intrinsic Amplitude: 3 mV
Lead Channel Sensing Intrinsic Amplitude: 6.25 mV
Lead Channel Sensing Intrinsic Amplitude: 8.625 mV
Lead Channel Setting Pacing Amplitude: 2.5 V
Lead Channel Setting Pacing Pulse Width: 0.4 ms
MDC IDC MSMT BATTERY VOLTAGE: 3.11 V
MDC IDC MSMT LEADCHNL LV IMPEDANCE VALUE: 361 Ohm
MDC IDC MSMT LEADCHNL LV IMPEDANCE VALUE: 551 Ohm
MDC IDC MSMT LEADCHNL LV PACING THRESHOLD AMPLITUDE: 1.375 V
MDC IDC MSMT LEADCHNL RA IMPEDANCE VALUE: 418 Ohm
MDC IDC MSMT LEADCHNL RA PACING THRESHOLD AMPLITUDE: 0.625 V
MDC IDC MSMT LEADCHNL RA PACING THRESHOLD PULSEWIDTH: 0.4 ms
MDC IDC MSMT LEADCHNL RV IMPEDANCE VALUE: 589 Ohm
MDC IDC MSMT LEADCHNL RV PACING THRESHOLD AMPLITUDE: 0.875 V
MDC IDC SET LEADCHNL LV PACING PULSEWIDTH: 0.4 ms
MDC IDC SET LEADCHNL RA PACING AMPLITUDE: 2 V
MDC IDC SET LEADCHNL RV PACING AMPLITUDE: 2 V
MDC IDC SET LEADCHNL RV SENSING SENSITIVITY: 0.3 mV
MDC IDC SET ZONE DETECTION INTERVAL: 350 ms
MDC IDC STAT BRADY AP VP PERCENT: 80.35 %
MDC IDC STAT BRADY AS VP PERCENT: 18.46 %
MDC IDC STAT BRADY AS VS PERCENT: 0.95 %
Zone Setting Detection Interval: 300 ms
Zone Setting Detection Interval: 350 ms
Zone Setting Detection Interval: 450 ms

## 2014-05-29 LAB — TSH: TSH: 1.64 u[IU]/mL (ref 0.35–4.50)

## 2014-05-29 LAB — COMPREHENSIVE METABOLIC PANEL
ALT: 38 U/L (ref 0–53)
AST: 26 U/L (ref 0–37)
Albumin: 3.6 g/dL (ref 3.5–5.2)
Alkaline Phosphatase: 38 U/L — ABNORMAL LOW (ref 39–117)
BUN: 14 mg/dL (ref 6–23)
CALCIUM: 9.3 mg/dL (ref 8.4–10.5)
CO2: 26 meq/L (ref 19–32)
CREATININE: 1.2 mg/dL (ref 0.4–1.5)
Chloride: 105 mEq/L (ref 96–112)
GFR: 65.79 mL/min (ref >60.00)
Glucose, Bld: 112 mg/dL — ABNORMAL HIGH (ref 70–99)
Potassium: 4.8 mEq/L (ref 3.5–5.1)
SODIUM: 139 meq/L (ref 135–145)
Total Bilirubin: 0.6 mg/dL (ref 0.2–1.2)
Total Protein: 7.1 g/dL (ref 6.0–8.3)

## 2014-05-29 MED ORDER — AMIODARONE HCL 200 MG PO TABS: ORAL_TABLET | ORAL | Status: DC

## 2014-05-29 NOTE — Patient Instructions (Addendum)
Your physician has recommended you make the following change in your medication:  1) DECREASE Amiodarone to 200 mg daily 5 days per week  Labs today: CMET, TSH  Remote monitoring is used to monitor your Pacemaker of ICD from home. This monitoring reduces the number of office visits required to check your device to one time per year. It allows [place] to keep an eye on the functioning of your device to ensure it is working properly. You are scheduled for a device check from home on 08/30/14. You may send your transmission at any time that day. If you have a wireless device, the transmission will be sent automatically. After your physician reviews your transmission, you will receive a postcard with your next transmission date.   Your physician wants you to follow-up in: 6 months with Dr. Caryl Comes. You will receive a reminder letter in the mail two months in advance. If you don't receive a letter, please call our office to schedule the follow-up appointment.

## 2014-05-29 NOTE — Progress Notes (Signed)
Patient Care Team: Madelyn Brunner, MD as PCP - General (Internal Medicine)   HPI  Jerry Farley is a 68 y.o. male Seen in followup for ICD implanted to remotely for which he underwent device generator replacement 7/13. Indication was polymorphic ventricular tachycardia for which he took amiodarone. This is occurring in the context of nonischemic cardiomyopathy. He also has a history of a 6949-lead which was revised the time of his procedure. He also underwent CRT upgrade of atrial lead and an LV lead.  The patient denies chest pain, shortness of breath, nocturnal dyspnea, orthopnea or peripheral edema. There have been no palpitations, lightheadedness or syncope.  Echocardiogram 7/14 EF 40-45%--LAE (40/1.9)    Past Medical History  Diagnosis Date  . NICM (nonischemic cardiomyopathy)     ejection fraction 40-45% by echo July 2012  . CAD (coronary artery disease) July 2007    CFX stent (patent Sept '07)  . Ventricular tachycardia, non-sustained 2007    Amiodarone  . 6948-lead   . Chronic systolic congestive heart failure   . ICD (implantable cardiac defibrillator) in place 2006, 2013    Medtronic Protecta XT-DR CRT (01/2012)  . Complication of anesthesia     "problems waking up w/valium"  . High cholesterol   . Hypothyroidism   . Arthritis     "maybe a little"    Past Surgical History  Procedure Laterality Date  . Cardiac defibrillator placement  09/2004; 02/10/12    ICD implanted in 2006 (Dr. Rito Ehrlich); EOL generator change in 01/2012 - CRT (BiV ICD) (Dr. Caryl Comes)  . Tonsillectomy and adenoidectomy      "when I was a child"  . Coronary angioplasty with stent placement   01/2006    CFX   . Transthoracic echocardiogram  01/2013    EF 40-45%, LV mildly dilated, mild LVH, mod hypokinesis of inferior myocardium, grade 1 diastolic dysfunction; mild MR  . Nm myocar perf ejection fraction  05/2010    dipyridamole myoview; mod perfusion defect in apical, basal inferior, mid  inferior & apical inferior regions (infarct/scar); post-stress 28%, low risk scan  . Cardiac catheterization  11/30/2001    non-ischemic cardiomyopahty with high LV end-diastolic pressure; 36% distal L main stenosis, large osital stenosis 70%, 70% Cfx stenosis (Dr. Domenic Moras)  . Cardiac catheterization  9/07    CFX stent patent [facility])    Current Outpatient Prescriptions  Medication Sig Dispense Refill  . amiodarone (PACERONE) 200 MG tablet Take 1 tablet (200 mg total) by mouth daily. 30 tablet 11  . aspirin 81 MG tablet Take 81 mg by mouth daily.      Marland Kitchen atorvastatin (LIPITOR) 10 MG tablet Take 1 tablet (10 mg total) by mouth daily. 30 tablet 6  . carvedilol (COREG) 25 MG tablet Take 1 tablet (25 mg total) by mouth 2 (two) times daily with a meal. 60 tablet 9  . clopidogrel (PLAVIX) 75 MG tablet Take 1 tablet (75 mg total) by mouth daily. 30 tablet 6  . ezetimibe (ZETIA) 10 MG tablet Take 1 tablet (10 mg total) by mouth daily. 30 tablet 4  . fish oil-omega-3 fatty acids 1000 MG capsule Take 1 g by mouth 3 (three) times a week.     . levothyroxine (SYNTHROID, LEVOTHROID) 75 MCG tablet Take 1 tablet (75 mcg total) by mouth daily before breakfast. 30 tablet 11  . Magnesium Oxide 400 MG CAPS Take 1 capsule by mouth.      . Multiple Vitamin (  MULTIVITAMIN) tablet Take 1 tablet by mouth daily.      . ramipril (ALTACE) 10 MG capsule Take 1 capsule (10 mg total) by mouth daily. 30 capsule 6  . spironolactone (ALDACTONE) 25 MG tablet Take 0.5 tablets (12.5 mg total) by mouth daily. 15 tablet 10  . you have a pacemaker book MISC 1 each by Does not apply route once. 1 each 0   No current facility-administered medications for this visit.    No Known Allergies  Review of Systems negative except from HPI and PMH  Physical Exam BP 84/62 mmHg  Pulse 72  Ht 5\' 6"  (1.676 m)  Wt 222 lb 6.4 oz (100.88 kg)  BMI 35.91 kg/m2 Well developed and well nourished in no acute distress HENT normal E  scleral and icterus clear Neck Supple JVP flat; carotids brisk and full Clear to ausculation  Regular rate and rhythm, no murmurs gallops or rub Soft with active bowel sounds No clubbing cyanosis uterine tingling Edema Alert and oriented, grossly normal motor and sensory function Skin Warm and Dry    Assessment and  Plan  Polymorphic ventricular tachycardia  Nonischemic cardiomyopathy  AIVR  Cough  Amiodarone  hypotension  CRT-[employer]   He has done exceptionally well. We will check his amiodarone surveillance laboratories.  We'll decrease his amiodarone from 14--1000 mg per week  We will leave his other medications as they are as he is not having any apparent side effects from his modest hypotension  We will check his PFTs with DLCO to look at amiodarone lung toxicity.

## 2014-06-06 ENCOUNTER — Encounter: Payer: Self-pay | Admitting: Internal Medicine

## 2014-06-26 ENCOUNTER — Other Ambulatory Visit: Payer: Self-pay

## 2014-06-26 MED ORDER — RAMIPRIL 10 MG PO CAPS
10.0000 mg | ORAL_CAPSULE | Freq: Every day | ORAL | Status: DC
Start: 2014-06-26 — End: 2015-01-21

## 2014-06-26 MED ORDER — CLOPIDOGREL BISULFATE 75 MG PO TABS: 75.0000 mg | ORAL_TABLET | Freq: Every day | ORAL | Status: DC

## 2014-06-26 MED ORDER — ATORVASTATIN CALCIUM 10 MG PO TABS: 10.0000 mg | ORAL_TABLET | Freq: Every day | ORAL | Status: DC

## 2014-07-05 ENCOUNTER — Encounter (HOSPITAL_COMMUNITY): Payer: Self-pay | Admitting: Internal Medicine

## 2014-08-06 DIAGNOSIS — E038 Other specified hypothyroidism: Secondary | ICD-10-CM | POA: Diagnosis not present

## 2014-08-06 DIAGNOSIS — Z0001 Encounter for general adult medical examination with abnormal findings: Secondary | ICD-10-CM | POA: Diagnosis not present

## 2014-08-06 DIAGNOSIS — E034 Atrophy of thyroid (acquired): Secondary | ICD-10-CM | POA: Diagnosis not present

## 2014-08-18 ENCOUNTER — Other Ambulatory Visit: Payer: Self-pay | Admitting: Cardiovascular Disease

## 2014-08-19 NOTE — Telephone Encounter (Signed)
Rx(s) sent to pharmacy electronically.  

## 2014-08-30 ENCOUNTER — Ambulatory Visit (INDEPENDENT_AMBULATORY_CARE_PROVIDER_SITE_OTHER): Payer: Commercial Managed Care - HMO | Admitting: *Deleted

## 2014-08-30 ENCOUNTER — Telehealth: Payer: Self-pay | Admitting: Cardiology

## 2014-08-30 DIAGNOSIS — I5022 Chronic systolic (congestive) heart failure: Secondary | ICD-10-CM

## 2014-08-30 DIAGNOSIS — I429 Cardiomyopathy, unspecified: Secondary | ICD-10-CM

## 2014-08-30 DIAGNOSIS — I428 Other cardiomyopathies: Secondary | ICD-10-CM

## 2014-08-30 LAB — MDC_IDC_ENUM_SESS_TYPE_REMOTE
Battery Voltage: 3.08 V
Brady Statistic AP VP Percent: 83.52 %
Brady Statistic AP VS Percent: 0.11 %
Brady Statistic RA Percent Paced: 83.63 %
Brady Statistic RV Percent Paced: 99.74 %
Date Time Interrogation Session: 20160204191009
HighPow Impedance: 418 Ohm
HighPow Impedance: 78 Ohm
Lead Channel Impedance Value: 418 Ohm
Lead Channel Impedance Value: 418 Ohm
Lead Channel Impedance Value: 494 Ohm
Lead Channel Impedance Value: 551 Ohm
Lead Channel Pacing Threshold Amplitude: 0.625 V
Lead Channel Pacing Threshold Amplitude: 0.875 V
Lead Channel Pacing Threshold Amplitude: 1.375 V
Lead Channel Pacing Threshold Pulse Width: 0.4 ms
Lead Channel Sensing Intrinsic Amplitude: 5.875 mV
Lead Channel Setting Pacing Amplitude: 2 V
Lead Channel Setting Pacing Amplitude: 2.5 V
MDC IDC MSMT LEADCHNL LV IMPEDANCE VALUE: 779 Ohm
MDC IDC MSMT LEADCHNL RA PACING THRESHOLD PULSEWIDTH: 0.4 ms
MDC IDC MSMT LEADCHNL RA SENSING INTR AMPL: 1.875 mV
MDC IDC MSMT LEADCHNL RA SENSING INTR AMPL: 1.875 mV
MDC IDC MSMT LEADCHNL RV PACING THRESHOLD PULSEWIDTH: 0.4 ms
MDC IDC MSMT LEADCHNL RV SENSING INTR AMPL: 5.875 mV
MDC IDC SET LEADCHNL LV PACING PULSEWIDTH: 0.4 ms
MDC IDC SET LEADCHNL RA PACING AMPLITUDE: 2 V
MDC IDC SET LEADCHNL RV PACING PULSEWIDTH: 0.4 ms
MDC IDC SET LEADCHNL RV SENSING SENSITIVITY: 0.3 mV
MDC IDC SET ZONE DETECTION INTERVAL: 300 ms
MDC IDC STAT BRADY AS VP PERCENT: 16.22 %
MDC IDC STAT BRADY AS VS PERCENT: 0.15 %
Zone Setting Detection Interval: 350 ms
Zone Setting Detection Interval: 350 ms
Zone Setting Detection Interval: 450 ms

## 2014-08-30 NOTE — Telephone Encounter (Signed)
Spoke with pt and reminded pt of remote transmission that is due today. Pt verbalized understanding.   

## 2014-08-30 NOTE — Progress Notes (Signed)
Remote ICD transmission.   

## 2014-09-10 ENCOUNTER — Encounter: Payer: Self-pay | Admitting: Cardiology

## 2014-09-14 ENCOUNTER — Encounter: Payer: Self-pay | Admitting: Cardiovascular Disease

## 2014-10-22 ENCOUNTER — Other Ambulatory Visit: Payer: Self-pay

## 2014-10-22 MED ORDER — CARVEDILOL 25 MG PO TABS: 25.0000 mg | ORAL_TABLET | Freq: Two times a day (BID) | ORAL | Status: DC

## 2014-12-19 ENCOUNTER — Ambulatory Visit (INDEPENDENT_AMBULATORY_CARE_PROVIDER_SITE_OTHER): Payer: Commercial Managed Care - HMO | Admitting: Internal Medicine

## 2014-12-19 ENCOUNTER — Encounter: Payer: Self-pay | Admitting: Internal Medicine

## 2014-12-19 VITALS — BP 100/70 | HR 71 | Ht 67.0 in | Wt 215.6 lb

## 2014-12-19 DIAGNOSIS — I472 Ventricular tachycardia, unspecified: Secondary | ICD-10-CM

## 2014-12-19 DIAGNOSIS — I428 Other cardiomyopathies: Secondary | ICD-10-CM

## 2014-12-19 DIAGNOSIS — I4729 Other ventricular tachycardia: Secondary | ICD-10-CM

## 2014-12-19 DIAGNOSIS — Z79899 Other long term (current) drug therapy: Secondary | ICD-10-CM

## 2014-12-19 DIAGNOSIS — I5022 Chronic systolic (congestive) heart failure: Secondary | ICD-10-CM

## 2014-12-19 DIAGNOSIS — Z4502 Encounter for adjustment and management of automatic implantable cardiac defibrillator: Secondary | ICD-10-CM

## 2014-12-19 DIAGNOSIS — I429 Cardiomyopathy, unspecified: Secondary | ICD-10-CM

## 2014-12-19 DIAGNOSIS — G4730 Sleep apnea, unspecified: Secondary | ICD-10-CM

## 2014-12-19 DIAGNOSIS — G478 Other sleep disorders: Secondary | ICD-10-CM

## 2014-12-19 LAB — CUP PACEART INCLINIC DEVICE CHECK
Battery Voltage: 3.09 V
Brady Statistic AP VP Percent: 85.38 %
Brady Statistic AS VP Percent: 14.3 %
Brady Statistic RV Percent Paced: 99.68 %
HighPow Impedance: 228 Ohm
HighPow Impedance: 513 Ohm
HighPow Impedance: 79 Ohm
Lead Channel Impedance Value: 399 Ohm
Lead Channel Impedance Value: 475 Ohm
Lead Channel Impedance Value: 513 Ohm
Lead Channel Impedance Value: 589 Ohm
Lead Channel Impedance Value: 836 Ohm
Lead Channel Pacing Threshold Amplitude: 0.75 V
Lead Channel Pacing Threshold Amplitude: 1.25 V
Lead Channel Pacing Threshold Pulse Width: 0.4 ms
Lead Channel Sensing Intrinsic Amplitude: 6.375 mV
Lead Channel Setting Pacing Amplitude: 2 V
Lead Channel Setting Pacing Amplitude: 2 V
Lead Channel Setting Pacing Pulse Width: 0.4 ms
Lead Channel Setting Sensing Sensitivity: 0.3 mV
MDC IDC MSMT LEADCHNL LV PACING THRESHOLD PULSEWIDTH: 0.4 ms
MDC IDC MSMT LEADCHNL RA PACING THRESHOLD AMPLITUDE: 0.75 V
MDC IDC MSMT LEADCHNL RA SENSING INTR AMPL: 2.75 mV
MDC IDC MSMT LEADCHNL RV PACING THRESHOLD PULSEWIDTH: 0.4 ms
MDC IDC SESS DTM: 20160525092428
MDC IDC SET LEADCHNL LV PACING AMPLITUDE: 2.5 V
MDC IDC SET LEADCHNL RV PACING PULSEWIDTH: 0.4 ms
MDC IDC STAT BRADY AP VS PERCENT: 0.13 %
MDC IDC STAT BRADY AS VS PERCENT: 0.19 %
MDC IDC STAT BRADY RA PERCENT PACED: 85.51 %
Zone Setting Detection Interval: 300 ms
Zone Setting Detection Interval: 350 ms
Zone Setting Detection Interval: 350 ms
Zone Setting Detection Interval: 450 ms

## 2014-12-19 LAB — TSH: TSH: 1.68 u[IU]/mL (ref 0.35–4.50)

## 2014-12-19 LAB — COMPREHENSIVE METABOLIC PANEL
ALBUMIN: 4.2 g/dL (ref 3.5–5.2)
ALK PHOS: 43 U/L (ref 39–117)
ALT: 28 U/L (ref 0–53)
AST: 21 U/L (ref 0–37)
BUN: 15 mg/dL (ref 6–23)
CALCIUM: 9.3 mg/dL (ref 8.4–10.5)
CO2: 29 meq/L (ref 19–32)
Chloride: 104 mEq/L (ref 96–112)
Creatinine, Ser: 1.17 mg/dL (ref 0.40–1.50)
GFR: 65.68 mL/min (ref >60.00)
Glucose, Bld: 120 mg/dL — ABNORMAL HIGH (ref 70–99)
POTASSIUM: 4.6 meq/L (ref 3.5–5.1)
Sodium: 138 mEq/L (ref 135–145)
Total Bilirubin: 0.5 mg/dL (ref 0.2–1.2)
Total Protein: 7.1 g/dL (ref 6.0–8.3)

## 2014-12-19 LAB — LIPID PANEL
CHOL/HDL RATIO: 4
Cholesterol: 126 mg/dL (ref 0–200)
HDL: 31.7 mg/dL — ABNORMAL LOW (ref >39.00)
LDL CALC: 73 mg/dL (ref 0–99)
NonHDL: 94.3
Triglycerides: 106 mg/dL (ref 0.0–149.0)
VLDL: 21.2 mg/dL (ref 0.0–40.0)

## 2014-12-19 LAB — LDL CHOLESTEROL, DIRECT: LDL DIRECT: 85 mg/dL

## 2014-12-19 MED ORDER — ATORVASTATIN CALCIUM 40 MG PO TABS: 40.0000 mg | ORAL_TABLET | Freq: Every day | ORAL | Status: DC

## 2014-12-19 NOTE — Patient Instructions (Addendum)
Medication Instructions:  Your physician has recommended you make the following change in your medication:  1) INCREASE Atorvastatin to 40 mg daily 2) STOP Ezetimibe (Zetia)  Labwork: CMET, TSH, Lipid panel, and Direct LDL today  Testing/Procedures: Your physician has recommended that you have a pulmonary function test. Pulmonary Function Tests are a group of tests that measure how well air moves in and out of your lungs.  Your physician has recommended that you have a sleep study. This test records several body functions during sleep, including: brain activity, eye movement, oxygen and carbon dioxide blood levels, heart rate and rhythm, breathing rate and rhythm, the flow of air through your mouth and nose, snoring, body muscle movements, and chest and belly movement.  Follow-Up: Remote monitoring is used to monitor your Pacemaker of ICD from home. This monitoring reduces the number of office visits required to check your device to one time per year. It allows [place] to keep an eye on the functioning of your device to ensure it is working properly. You are scheduled for a device check from home on 03/20/15. You may send your transmission at any time that day. If you have a wireless device, the transmission will be sent automatically. After your physician reviews your transmission, you will receive a postcard with your next transmission date.  Your physician wants you to follow-up in: 6 months with Chanetta Marshall, NP.  You will receive a reminder letter in the mail two months in advance. If you don't receive a letter, please call our office to schedule the follow-up appointment.  Your physician wants you to follow-up in: 1 year with Dr. Caryl Comes.  You will receive a reminder letter in the mail two months in advance. If you don't receive a letter, please call our office to schedule the follow-up appointment.   Thank you for choosing [facility]!!    Pulmonary Function Tests Pulmonary  function tests (PFTs) measure how well your lungs are working. The tests can help to identify the causes of lung problems. They can also help your health care provider select the best treatment for you. Your health care provider may order pulmonary function for any of the following reasons:  When an illness involving the lungs is suspected.  To follow changes in your lung function over time if you are known to have a chronic lung disease.  For industrial plant workers to examine the effects of being exposed to chemicals over a long period of time.  To assess lung function prior to surgery or other procedures.  For people who are smokers. Your measured lung function will be compared to the expected lung function of someone with healthy lungs who is similar to you in age, gender, size, and other factors. This is used to determine your "percent predicted" lung function, which is how your health care provider knows if your lung function is normal or abnormal. If you have had prior pulmonary function testing performed, your health care provider will also compare your current results with past tests to see if your lung function is better, worse, or staying the same. This can sometimes be useful to see if treatments are working.  LET [facility] CARE PROVIDER KNOW ABOUT:  Any allergies you have.  All medicines you are taking, including inhaler or nebulizer medicines, vitamins, herbs, eye drops, creams, and over-the-counter medicines.  Any blood disorders you have.  Previous surgeries you have had, especially recent eye surgery, abdominal surgery, or chest surgery. These can make  performing pulmonary function tests difficult or unsafe.  Medical conditions you have.  Chest pain or heart problems.  Tuberculosis or respiratory infections, such as pneumonia, a cold, or the flu. If you think you will have difficulty performing any of the breathing maneuvers, ask your health care provider if you should  reschedule the test. RISKS AND COMPLICATIONS: Generally, pulmonary function testing is a safe procedure. However, as with any procedure, complications can occur. Possible complications include:  Lightheadedness due to overbreathing (hyperventilation).  An asthmatic attack from deep breathing. BEFORE THE PROCEDURE  Take medicine as directed by your health care provider. If you take inhaler or nebulizer medicines, ask your health care provider which medicines you should take on the day of your test. Some inhaler medicines may interfere with pulmonary function tests, such as bronchodilator testing, if taken shortly before the test.  Avoid eating a large meal before your test.  Do not smoke before your test.  Wear comfortable clothing which will not interfere with breathing. PROCEDURE  You will be given a soft nose clip to wear during the procedure. This is done so that all of your breaths will go through your mouth instead of your nose.  You will be given a germ-free (sterile) mouthpiece. It will be attached to a spirometer. The spirometer is the machine that measures your breathing.  You will be instructed to perform various breathing maneuvers. The maneuvers will be done by breathing in (inhaling) and breathing out (exhaling). Depending on what measurements are ordered, you may be asked to repeat the maneuvers several times before the test is completed.  It is important to follow the instructions exactly to obtain accurate results. Make sure to blow as hard and as fast as you can when you are instructed to do so.  You may be given a bronchodilator after testing has been performed. A bronchodilator is a medicine which makes the small air passages in your lungs larger. These medicines usually make it easier to breathe. The tests are then repeated several minutes later after the bronchodilator has taken effect.  You will be monitored carefully during the procedure for faintness, dizziness,  difficulty breathing, or any other problems. AFTER THE PROCEDURE   You may resume your usual diet, medicines, and activities as directed by your health care provider.  Your health care provider will go over your test results with you and determine what treatments may be helpful. Document Released: 03/05/2004 Document Revised: 05/03/2013 Document Reviewed: 02/09/2013 [facility] Patient Information 2015 [place], [place]. This information is not intended to replace advice given to you by your health care provider. Make sure you discuss any questions you have with your health care provider.

## 2014-12-19 NOTE — Progress Notes (Signed)
Patient Care Team: Madelyn Brunner, MD as PCP - General (Internal Medicine)   HPI  Jerry Farley is a 69 y.o. male Seen in followup for ICD implanted to remotely for which he underwent device generator replacement 7/13. Indication was polymorphic ventricular tachycardia for which he took amiodarone. This occurred in the context of nonischemic cardiomyopathy. He also has a history of a 6949-lead which was revised the time of generator replacement   He also underwent CRT upgrade of atrial lead and an LV lead.   He also has coronary artery disease with prior stenting of his circumflex.   The patient denies chest pain, shortness of breath, nocturnal dyspnea, orthopnea or peripheral edema. There have been no palpitations, lightheadedness or syncope.  Echocardiogram 7/14 EF 40-45%--LAE (40/1.9) Myoview 2011 demonstrated a large inferoapical scar and without ischemia. Ejection fraction at that time was 29%    Past Medical History  Diagnosis Date  . NICM (nonischemic cardiomyopathy)     ejection fraction 40-45% by echo July 2012  . CAD (coronary artery disease) July 2007    CFX stent (patent Sept '07)  . Ventricular tachycardia, non-sustained 2007    Amiodarone  . 6948-lead   . Chronic systolic congestive heart failure   . ICD (implantable cardiac defibrillator) in place 2006, 2013    Medtronic Protecta XT-DR CRT (01/2012)  . Complication of anesthesia     "problems waking up w/valium"  . High cholesterol   . Hypothyroidism   . Arthritis     "maybe a little"    Past Surgical History  Procedure Laterality Date  . Cardiac defibrillator placement  09/2004; 02/10/12    ICD implanted in 2006 (Dr. Rito Ehrlich); EOL generator change in 01/2012 - CRT (BiV ICD) (Dr. Caryl Comes)  . Tonsillectomy and adenoidectomy      "when I was a child"  . Coronary angioplasty with stent placement   01/2006    CFX   . Transthoracic echocardiogram  01/2013    EF 40-45%, LV mildly dilated, mild LVH, mod  hypokinesis of inferior myocardium, grade 1 diastolic dysfunction; mild MR  . Nm myocar perf ejection fraction  05/2010    dipyridamole myoview; mod perfusion defect in apical, basal inferior, mid inferior & apical inferior regions (infarct/scar); post-stress 28%, low risk scan  . Cardiac catheterization  11/30/2001    non-ischemic cardiomyopahty with high LV end-diastolic pressure; 01% distal L main stenosis, large osital stenosis 70%, 70% Cfx stenosis (Dr. Domenic Moras)  . Cardiac catheterization  9/07    CFX stent patent [facility])  . Bi-ventricular implantable cardioverter defibrillator upgrade N/A 02/10/2012    Procedure: BI-VENTRICULAR IMPLANTABLE CARDIOVERTER DEFIBRILLATOR UPGRADE;  Surgeon: Deboraha Sprang, MD;  Location: [facility] CATH LAB;  Service: Cardiovascular;  Laterality: N/A;    Current Outpatient Prescriptions  Medication Sig Dispense Refill  . amiodarone (PACERONE) 200 MG tablet Take one tablet (200 mg total) 5 days per week. 20 tablet 6  . aspirin 81 MG tablet Take 81 mg by mouth daily.      Marland Kitchen atorvastatin (LIPITOR) 10 MG tablet Take 1 tablet (10 mg total) by mouth daily. 30 tablet 6  . carvedilol (COREG) 25 MG tablet Take 1 tablet (25 mg total) by mouth 2 (two) times daily with a meal. 60 tablet 9  . clopidogrel (PLAVIX) 75 MG tablet Take 1 tablet (75 mg total) by mouth daily. 30 tablet 6  . ezetimibe (ZETIA) 10 MG tablet Take 1 tablet (10 mg total) by  mouth daily. 30 tablet 4  . fish oil-omega-3 fatty acids 1000 MG capsule Take 1 g by mouth 3 (three) times a week.     . Magnesium Oxide 400 MG CAPS Take 1 capsule by mouth daily.     . Multiple Vitamin (MULTIVITAMIN) tablet Take 1 tablet by mouth daily.      . ramipril (ALTACE) 10 MG capsule Take 1 capsule (10 mg total) by mouth daily. 30 capsule 6  . spironolactone (ALDACTONE) 25 MG tablet TAKE ONE-HALF TABLET BY MOUTH ONCE DAILY 15 tablet 5  . you have a pacemaker book MISC 1 each by Does not apply route once. 1 each 0  .  levothyroxine (SYNTHROID, LEVOTHROID) 75 MCG tablet Take 1 tablet (75 mcg total) by mouth daily before breakfast. 30 tablet 11   No current facility-administered medications for this visit.    No Known Allergies  Review of Systems negative except from HPI and PMH  Physical Exam BP 100/70 mmHg  Pulse 71  Ht 5\' 7"  (1.702 m)  Wt 215 lb 9.6 oz (97.796 kg)  BMI 33.76 kg/m2 Well developed and well nourished in no acute distress HENT normal E scleral and icterus clear Neck Supple JVP flat; carotids brisk and full Clear to ausculation Device pocket well healed; without hematoma or erythema.  There is no tethering  Regular rate and rhythm, no murmurs gallops or rub Soft with active bowel sounds No clubbing cyanosis uterine tingling Edema Alert and oriented, grossly normal motor and sensory function Skin Warm and Dry  ECG today demonstrates sinus rhythm with P-synchronous/ AV  pacing  QRS is surprisingly broad at 185 ms  Assessment and  Plan  Polymorphic ventricular tachycardia intercurrent nonsustained episodes  Nonischemic cardiomyopathy  Coronary artery disease with prior stenting  Amiodarone  CRT-[employer]  Sleep disordered breathing     He has done exceptionally well. We will check his amiodarone surveillance laboratories  Pulmonary function testing was ordered last fall and never consummated we will do that   will undertake a sleep study.  We will discontinue zetia increases atorvastatin from 10-40  His target dose would be 80 mg.   We will plan to check his cholesterol levels today as well.  Rene Paci encouraged him in the importance of exercise and weight loss.  We will discontinue his area. His low-dose statin can be uptitrated if necessary  We will continue him on guideline directed medical therapy.  He is potentially a candidate for Entresto although I suspect his blood pressure will be limiting..  We will check his potassium on Aldactone.

## 2015-01-19 ENCOUNTER — Other Ambulatory Visit: Payer: Self-pay | Admitting: Internal Medicine

## 2015-01-21 ENCOUNTER — Other Ambulatory Visit: Payer: Self-pay

## 2015-01-21 MED ORDER — RAMIPRIL 10 MG PO CAPS: 10.0000 mg | ORAL_CAPSULE | Freq: Every day | ORAL | Status: DC

## 2015-01-21 MED ORDER — CLOPIDOGREL BISULFATE 75 MG PO TABS: 75.0000 mg | ORAL_TABLET | Freq: Every day | ORAL | Status: DC

## 2015-02-21 ENCOUNTER — Other Ambulatory Visit: Payer: Self-pay | Admitting: Cardiology

## 2015-02-21 ENCOUNTER — Other Ambulatory Visit: Payer: Self-pay | Admitting: Internal Medicine

## 2015-02-21 NOTE — Telephone Encounter (Signed)
REFILL 

## 2015-02-28 ENCOUNTER — Encounter (HOSPITAL_BASED_OUTPATIENT_CLINIC_OR_DEPARTMENT_OTHER): Payer: Commercial Managed Care - HMO

## 2015-03-20 ENCOUNTER — Ambulatory Visit (INDEPENDENT_AMBULATORY_CARE_PROVIDER_SITE_OTHER): Payer: Commercial Managed Care - HMO | Admitting: *Deleted

## 2015-03-20 DIAGNOSIS — I429 Cardiomyopathy, unspecified: Secondary | ICD-10-CM

## 2015-03-20 DIAGNOSIS — I5022 Chronic systolic (congestive) heart failure: Secondary | ICD-10-CM

## 2015-03-20 DIAGNOSIS — I428 Other cardiomyopathies: Secondary | ICD-10-CM

## 2015-03-20 NOTE — Progress Notes (Signed)
Remote ICD transmission.   

## 2015-03-23 LAB — CUP PACEART REMOTE DEVICE CHECK
Battery Voltage: 3.07 V
Brady Statistic AS VP Percent: 17.9 %
Brady Statistic AS VS Percent: 0.61 %
Brady Statistic RA Percent Paced: 81.49 %
Date Time Interrogation Session: 20160824062508
HighPow Impedance: 494 Ohm
HighPow Impedance: 78 Ohm
Lead Channel Impedance Value: 399 Ohm
Lead Channel Impedance Value: 589 Ohm
Lead Channel Impedance Value: 874 Ohm
Lead Channel Pacing Threshold Amplitude: 0.75 V
Lead Channel Pacing Threshold Amplitude: 1.375 V
Lead Channel Pacing Threshold Pulse Width: 0.4 ms
Lead Channel Sensing Intrinsic Amplitude: 2.125 mV
Lead Channel Sensing Intrinsic Amplitude: 2.125 mV
Lead Channel Sensing Intrinsic Amplitude: 7.375 mV
Lead Channel Setting Pacing Amplitude: 2 V
Lead Channel Setting Pacing Pulse Width: 0.4 ms
Lead Channel Setting Pacing Pulse Width: 0.4 ms
MDC IDC MSMT LEADCHNL LV IMPEDANCE VALUE: 437 Ohm
MDC IDC MSMT LEADCHNL LV IMPEDANCE VALUE: 513 Ohm
MDC IDC MSMT LEADCHNL LV PACING THRESHOLD PULSEWIDTH: 0.4 ms
MDC IDC MSMT LEADCHNL RA PACING THRESHOLD AMPLITUDE: 0.625 V
MDC IDC MSMT LEADCHNL RA PACING THRESHOLD PULSEWIDTH: 0.4 ms
MDC IDC MSMT LEADCHNL RV SENSING INTR AMPL: 7.375 mV
MDC IDC SET LEADCHNL LV PACING AMPLITUDE: 2.5 V
MDC IDC SET LEADCHNL RV PACING AMPLITUDE: 2 V
MDC IDC SET LEADCHNL RV SENSING SENSITIVITY: 0.3 mV
MDC IDC SET ZONE DETECTION INTERVAL: 350 ms
MDC IDC SET ZONE DETECTION INTERVAL: 450 ms
MDC IDC STAT BRADY AP VP PERCENT: 81.12 %
MDC IDC STAT BRADY AP VS PERCENT: 0.37 %
MDC IDC STAT BRADY RV PERCENT PACED: 99.02 %
Zone Setting Detection Interval: 300 ms
Zone Setting Detection Interval: 350 ms

## 2015-04-05 ENCOUNTER — Encounter: Payer: Self-pay | Admitting: Cardiology

## 2015-04-17 ENCOUNTER — Encounter: Payer: Self-pay | Admitting: Cardiovascular Disease

## 2015-05-26 ENCOUNTER — Other Ambulatory Visit: Payer: Self-pay | Admitting: Cardiovascular Disease

## 2015-06-23 ENCOUNTER — Encounter: Payer: Self-pay | Admitting: Nurse Practitioner

## 2015-06-23 NOTE — Progress Notes (Signed)
Electrophysiology Office Note Date: 06/24/2015  ID:  Jerry Farley, DOB 06-Aug-1945, MRN [id number]  PCP: Madelyn Brunner, MD Electrophysiologist: Caryl Comes  CC: Routine CRTD follow-up  Jerry Farley is a 69 y.o. male seen today for Dr Caryl Comes.  He presents today for routine electrophysiology followup.  Since last being seen in our clinic, the patient reports doing very well. He denies chest pain, palpitations, dyspnea, PND, orthopnea, nausea, vomiting, dizziness, syncope, edema, weight gain, or early satiety.  He has not had ICD shocks.   Sleep study and PFT's ordered at last office visit with Dr Caryl Comes, but not done.  He is still wanting to hold off on scheduling these today.    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   Device History: MDT single chamber ICD implanted 2006 for PMVT/secondary prevention; upgrade to MDT CRTD 2013 with capping of 6948 lead History of appropriate therapy: No History of AAD therapy: yes - amiodarone    Past Medical History  Diagnosis Date  . NICM (nonischemic cardiomyopathy) ([place])     ejection fraction 40-45% by echo July 2012  . CAD (coronary artery disease) July 2007    CFX stent (patent Sept '07)  . Ventricular tachycardia, non-sustained ([place]) 2007    Amiodarone  . 6948-lead   . Chronic systolic congestive heart failure ([place])   . ICD (implantable cardiac defibrillator) in place 2006, 2013    Medtronic Protecta XT-DR CRT (01/2012)  . Complication of anesthesia     "problems waking up w/valium"  . High cholesterol   . Hypothyroidism   . Arthritis     "maybe a little"   Past Surgical History  Procedure Laterality Date  . Cardiac defibrillator placement  09/2004; 02/10/12    ICD implanted in 2006 (Dr. Rito Ehrlich); EOL generator change in 01/2012 - CRT (BiV ICD) (Dr. Caryl Comes)  . Tonsillectomy and adenoidectomy      "when I was a child"  . Coronary angioplasty with stent placement   01/2006    CFX   . Transthoracic  echocardiogram  01/2013    EF 40-45%, LV mildly dilated, mild LVH, mod hypokinesis of inferior myocardium, grade 1 diastolic dysfunction; mild MR  . Nm myocar perf ejection fraction  05/2010    dipyridamole myoview; mod perfusion defect in apical, basal inferior, mid inferior & apical inferior regions (infarct/scar); post-stress 28%, low risk scan  . Cardiac catheterization  11/30/2001    non-ischemic cardiomyopahty with high LV end-diastolic pressure; [id number] distal L main stenosis, large osital stenosis 70%, 70% Cfx stenosis (Dr. Domenic Moras)  . Cardiac catheterization  9/07    CFX stent patent [facility])  . Bi-ventricular implantable cardioverter defibrillator upgrade N/A 02/10/2012    Procedure: BI-VENTRICULAR IMPLANTABLE CARDIOVERTER DEFIBRILLATOR UPGRADE;  Surgeon: Deboraha Sprang, MD;  Location: [facility] CATH LAB;  Service: Cardiovascular;  Laterality: N/A;    Current Outpatient Prescriptions  Medication Sig Dispense Refill  . aspirin 81 MG tablet Take 81 mg by mouth daily.      Marland Kitchen atorvastatin (LIPITOR) 40 MG tablet Take 1 tablet (40 mg total) by mouth daily. 30 tablet 6  . carvedilol (COREG) 25 MG tablet Take 1 tablet (25 mg total) by mouth 2 (two) times daily with a meal. 60 tablet 9  . clopidogrel (PLAVIX) 75 MG tablet Take 1 tablet (75 mg total) by mouth daily. 30 tablet 6  . fish oil-omega-3 fatty acids 1000 MG capsule Take 1 g by mouth 3 (three)  times a week.     . levothyroxine (SYNTHROID, LEVOTHROID) 75 MCG tablet TAKE ONE TABLET BY MOUTH ONCE DAILY BEFORE BREAKFAST 30 tablet 5  . Magnesium Oxide 400 MG CAPS Take 1 capsule by mouth daily.     . Multiple Vitamin (MULTIVITAMIN) tablet Take 1 tablet by mouth daily.      Marland Kitchen PACERONE 200 MG tablet TAKE ONE TABLET BY MOUTH ONCE DAILY 30 tablet 6  . ramipril (ALTACE) 10 MG capsule Take 1 capsule (10 mg total) by mouth daily. 30 capsule 6  . spironolactone (ALDACTONE) 25 MG tablet Take 0.5 tablets (12.5 mg total) by mouth daily. 15 tablet 8  . you  have a pacemaker book MISC 1 each by Does not apply route once. 1 each 0   No current facility-administered medications for this visit.    Allergies:   Review of patient's allergies indicates no known allergies.   Social History: Social History   Social History  . Marital Status: Married    Spouse Name: N/A  . Number of Children: N/A  . Years of Education: N/A   Occupational History  . retired -- [employer]    Social History Main Topics  . Smoking status: Former Smoker -- 1.50 packs/day for 42 years    Types: Cigarettes    Quit date: 07/28/2003  . Smokeless tobacco: Former Systems developer    Types: Chew     Comment: "didn't chew but a year or 2; don't know when I quit"  . Alcohol Use: No  . Drug Use: No  . Sexual Activity: No   Other Topics Concern  . Not on file   Social History Narrative    Family History: Family History  Problem Relation Age of Onset  . Cancer Mother   . Cancer Brother     Review of Systems: All other systems reviewed and are otherwise negative except as noted above.   Physical Exam: VS:  BP 124/62 mmHg  Pulse 74  Ht 5\' 7"  (1.702 m)  Wt 219 lb (99.338 kg)  BMI 34.29 kg/m2 , BMI Body mass index is 34.29 kg/(m^2).  GEN- The patient is obese appearing, alert and oriented x 3 today.   HEENT: normocephalic, atraumatic; sclera clear, conjunctiva pink; hearing intact; oropharynx clear; neck supple  Lungs- Clear to ausculation bilaterally, normal work of breathing.  No wheezes, rales, rhonchi Heart- Regular rate and rhythm (paced) GI- soft, non-tender, non-distended, bowel sounds present  Extremities- no clubbing, cyanosis, or edema; DP/PT/radial pulses 2+ bilaterally MS- no significant deformity or atrophy Skin- warm and dry, no rash or lesion; ICD pocket well healed Psych- euthymic mood, full affect Neuro- strength and sensation are intact  ICD interrogation- reviewed in detail today,  See PACEART report  EKG:  EKG is not ordered  today.  Recent Labs: 12/19/2014: ALT 28; BUN 15; Creatinine, Ser 1.17; Potassium 4.6; Sodium 138; TSH 1.68   Wt Readings from Last 3 Encounters:  06/24/15 219 lb (99.338 kg)  12/19/14 215 lb 9.6 oz (97.796 kg)  05/29/14 222 lb 6.4 oz (100.88 kg)     Other studies Reviewed: Additional studies/ records that were reviewed today include: Dr Olin Pia office notes, recent labs  Assessment and Plan:  1.  Chronic systolic dysfunction euvolemic today Stable on an appropriate medical regimen Normal ICD function See Pace Art report No changes today BMET today  2.  CAD/ICM No recent ischemic symptoms Lipitor dose increased 11/2014 Will update lipids/LFT's Continue ASA/BB/statin/Plavix The patient had a Cypher stent  placed in 2007 and has been maintained on Plavix since - will defer decision to discontinue Plavix to Dr Caryl Comes.  Update echo  3.  VT 3 monitored VT episodes noted today. He is asymptomatic.  No changes to ICD programming.  Update TSH, LFT's PFT's recommended, but the patient would like to defer PFT's for now. He is aware of rationale for checking. He has been advised to call the office with any pulmonary symptoms.    Current medicines are reviewed at length with the patient today.   The patient does not have concerns regarding his medicines.  The following changes were made today:  none  Labs/ tests ordered today include: BMET, LFT's, lipids, TSH, echo    Disposition:   Follow up with [facility] transmissions every 3 months, Dr Caryl Comes in 6 months   Signed, Jerry Marshall, NP 06/24/2015 8:15 AM  [place] [address] [phone] (office) [phone] (fax)

## 2015-06-24 ENCOUNTER — Ambulatory Visit (INDEPENDENT_AMBULATORY_CARE_PROVIDER_SITE_OTHER): Payer: Commercial Managed Care - HMO | Admitting: Nurse Practitioner

## 2015-06-24 ENCOUNTER — Encounter: Payer: Self-pay | Admitting: Internal Medicine

## 2015-06-24 ENCOUNTER — Encounter: Payer: Self-pay | Admitting: Nurse Practitioner

## 2015-06-24 VITALS — BP 124/62 | HR 74 | Ht 67.0 in | Wt 219.0 lb

## 2015-06-24 DIAGNOSIS — I472 Ventricular tachycardia, unspecified: Secondary | ICD-10-CM

## 2015-06-24 DIAGNOSIS — I5022 Chronic systolic (congestive) heart failure: Secondary | ICD-10-CM

## 2015-06-24 DIAGNOSIS — E785 Hyperlipidemia, unspecified: Secondary | ICD-10-CM | POA: Diagnosis not present

## 2015-06-24 DIAGNOSIS — I255 Ischemic cardiomyopathy: Secondary | ICD-10-CM

## 2015-06-24 LAB — CUP PACEART INCLINIC DEVICE CHECK
Battery Voltage: 3.06 V
Brady Statistic AP VP Percent: 81.3 %
Brady Statistic AP VS Percent: 0.3 %
Date Time Interrogation Session: 20161128143026
Implantable Lead Implant Date: 20130717
Implantable Lead Implant Date: 20130717
Implantable Lead Model: 4396
Implantable Lead Model: 5076
Lead Channel Impedance Value: 418 Ohm
Lead Channel Impedance Value: 589 Ohm
Lead Channel Impedance Value: 798 Ohm
Lead Channel Pacing Threshold Amplitude: 0.5 V
Lead Channel Pacing Threshold Pulse Width: 0.4 ms
Lead Channel Pacing Threshold Pulse Width: 0.4 ms
Lead Channel Pacing Threshold Pulse Width: 0.4 ms
Lead Channel Sensing Intrinsic Amplitude: 2.1 mV
Lead Channel Setting Pacing Amplitude: 2 V
Lead Channel Setting Pacing Amplitude: 2.5 V
Lead Channel Setting Sensing Sensitivity: 0.3 mV
MDC IDC LEAD IMPLANT DT: 20130717
MDC IDC LEAD LOCATION: 753858
MDC IDC LEAD LOCATION: 753859
MDC IDC LEAD LOCATION: 753860
MDC IDC LEAD MODEL: 6935
MDC IDC MSMT LEADCHNL LV PACING THRESHOLD AMPLITUDE: 1.5 V
MDC IDC MSMT LEADCHNL RV PACING THRESHOLD AMPLITUDE: 0.75 V
MDC IDC MSMT LEADCHNL RV SENSING INTR AMPL: 8.9 mV
MDC IDC SET LEADCHNL LV PACING PULSEWIDTH: 0.4 ms
MDC IDC SET LEADCHNL RV PACING AMPLITUDE: 2 V
MDC IDC SET LEADCHNL RV PACING PULSEWIDTH: 0.4 ms
MDC IDC STAT BRADY AS VP PERCENT: 17.9 %
MDC IDC STAT BRADY AS VS PERCENT: 0.5 %

## 2015-06-24 LAB — BASIC METABOLIC PANEL
BUN: 15 mg/dL (ref 7–25)
CALCIUM: 9.5 mg/dL (ref 8.6–10.3)
CO2: 28 mmol/L (ref 20–31)
CREATININE: 1.01 mg/dL (ref 0.70–1.25)
Chloride: 106 mmol/L (ref 98–110)
Glucose, Bld: 109 mg/dL — ABNORMAL HIGH (ref 65–99)
Potassium: 4.5 mmol/L (ref 3.5–5.3)
Sodium: 142 mmol/L (ref 135–146)

## 2015-06-24 LAB — LIPID PANEL
CHOL/HDL RATIO: 4.7 ratio (ref <=5.0)
CHOLESTEROL: 126 mg/dL (ref 125–200)
HDL: 27 mg/dL — AB (ref >=40)
LDL Cholesterol: 65 mg/dL (ref <130)
TRIGLYCERIDES: 170 mg/dL — AB (ref <150)
VLDL: 34 mg/dL — AB (ref <30)

## 2015-06-24 LAB — HEPATIC FUNCTION PANEL
ALT: 30 U/L (ref 9–46)
AST: 19 U/L (ref 10–35)
Albumin: 4.2 g/dL (ref 3.6–5.1)
Alkaline Phosphatase: 41 U/L (ref 40–115)
BILIRUBIN TOTAL: 0.6 mg/dL (ref 0.2–1.2)
Bilirubin, Direct: 0.1 mg/dL (ref <=0.2)
Indirect Bilirubin: 0.5 mg/dL (ref 0.2–1.2)
Total Protein: 6.7 g/dL (ref 6.1–8.1)

## 2015-06-24 LAB — TSH: TSH: 0.489 u[IU]/mL (ref 0.350–4.500)

## 2015-06-24 MED ORDER — SPIRONOLACTONE 25 MG PO TABS: 12.5000 mg | ORAL_TABLET | Freq: Every day | ORAL | Status: DC

## 2015-06-24 NOTE — Patient Instructions (Addendum)
Medication Instructions:     If you need a refill on your cardiac medications before your next appointment, please call your pharmacy.  Labwork: BMET TSH LFT AND LIPIDS     Testing/Procedures:   Your physician has requested that you have an echocardiogram. Echocardiography is a painless test that uses sound waves to create images of your heart. It provides your doctor with information about the size and shape of your heart and how well your heart's chambers and valves are working. This procedure takes approximately one hour. There are no restrictions for this procedure.     Follow-Up: Remote monitoring is used to monitor your Pacemaker of ICD from home. This monitoring reduces the number of office visits required to check your device to one time per year. It allows [place] to keep an eye on the functioning of your device to ensure it is working properly. You are scheduled for a device check from home on . 2/28 /16  You may send your transmission at any time that day. If you have a wireless device, the transmission will be sent automatically. After your physician reviews your transmission, you will receive a postcard with your next transmission date.  Your physician wants you to follow-up in:  [place] will receive a reminder letter in the mail two months in advance. If you don't receive a letter, please call our office to schedule the follow-up appointment.      Any Other Special Instructions Will Be Listed Below (If Applicable).

## 2015-06-27 ENCOUNTER — Other Ambulatory Visit: Payer: Self-pay

## 2015-06-27 ENCOUNTER — Ambulatory Visit (HOSPITAL_COMMUNITY): Payer: Commercial Managed Care - HMO | Attending: Internal Medicine

## 2015-06-27 DIAGNOSIS — I5021 Acute systolic (congestive) heart failure: Secondary | ICD-10-CM | POA: Diagnosis present

## 2015-06-27 DIAGNOSIS — I071 Rheumatic tricuspid insufficiency: Secondary | ICD-10-CM | POA: Insufficient documentation

## 2015-06-27 DIAGNOSIS — I5022 Chronic systolic (congestive) heart failure: Secondary | ICD-10-CM | POA: Insufficient documentation

## 2015-06-27 DIAGNOSIS — I340 Nonrheumatic mitral (valve) insufficiency: Secondary | ICD-10-CM | POA: Diagnosis not present

## 2015-07-03 ENCOUNTER — Telehealth: Payer: Self-pay | Admitting: *Deleted

## 2015-07-03 NOTE — Telephone Encounter (Signed)
-----   Message from Patsey Berthold, NP sent at 06/24/2015  2:38 PM EST ----- Please notify patient of stable labs. Decreasing carbohydrates and sweets in diet will help with elevated triglycerides. No changes to medication at this time. Thank you!

## 2015-07-03 NOTE — Telephone Encounter (Signed)
-----   Message from Jerry Berthold, NP sent at 06/27/2015  1:47 PM EST ----- Please notify patient of stable echo results.

## 2015-07-03 NOTE — Telephone Encounter (Signed)
-----   Message from Patsey Berthold, NP sent at 06/27/2015  1:47 PM EST ----- Please notify patient of stable echo results.

## 2015-07-05 NOTE — Telephone Encounter (Signed)
-----   Message from Jerry Berthold, NP sent at 06/27/2015  1:47 PM EST ----- Please notify patient of stable echo results.

## 2015-07-29 ENCOUNTER — Other Ambulatory Visit: Payer: Self-pay | Admitting: Internal Medicine

## 2015-08-07 DIAGNOSIS — I2510 Atherosclerotic heart disease of native coronary artery without angina pectoris: Secondary | ICD-10-CM | POA: Diagnosis not present

## 2015-08-07 DIAGNOSIS — J209 Acute bronchitis, unspecified: Secondary | ICD-10-CM | POA: Diagnosis not present

## 2015-08-07 DIAGNOSIS — Z0001 Encounter for general adult medical examination with abnormal findings: Secondary | ICD-10-CM | POA: Diagnosis not present

## 2015-08-07 DIAGNOSIS — E034 Atrophy of thyroid (acquired): Secondary | ICD-10-CM | POA: Diagnosis not present

## 2015-08-07 DIAGNOSIS — E7800 Pure hypercholesterolemia, unspecified: Secondary | ICD-10-CM | POA: Diagnosis not present

## 2015-08-22 ENCOUNTER — Other Ambulatory Visit: Payer: Self-pay | Admitting: Internal Medicine

## 2015-08-22 ENCOUNTER — Other Ambulatory Visit: Payer: Self-pay | Admitting: Cardiology

## 2015-09-23 ENCOUNTER — Ambulatory Visit (INDEPENDENT_AMBULATORY_CARE_PROVIDER_SITE_OTHER): Payer: Commercial Managed Care - HMO | Admitting: *Deleted

## 2015-09-23 DIAGNOSIS — I255 Ischemic cardiomyopathy: Secondary | ICD-10-CM | POA: Diagnosis not present

## 2015-09-23 DIAGNOSIS — I5022 Chronic systolic (congestive) heart failure: Secondary | ICD-10-CM

## 2015-09-23 NOTE — Progress Notes (Signed)
Remote ICD transmission.   

## 2015-10-11 ENCOUNTER — Encounter: Payer: Self-pay | Admitting: Cardiology

## 2015-10-11 LAB — CUP PACEART REMOTE DEVICE CHECK
Battery Voltage: 3.03 V
Brady Statistic AP VP Percent: 85.26 %
Brady Statistic AS VP Percent: 13.33 %
Brady Statistic RA Percent Paced: 85.61 %
Brady Statistic RV Percent Paced: 98.59 %
Date Time Interrogation Session: 20170227051708
HIGH POWER IMPEDANCE MEASURED VALUE: 475 Ohm
HighPow Impedance: 77 Ohm
Implantable Lead Implant Date: 20130717
Implantable Lead Implant Date: 20130717
Implantable Lead Location: 753858
Implantable Lead Location: 753860
Implantable Lead Model: 4396
Implantable Lead Model: 5076
Lead Channel Impedance Value: 570 Ohm
Lead Channel Impedance Value: 874 Ohm
Lead Channel Pacing Threshold Amplitude: 0.75 V
Lead Channel Pacing Threshold Amplitude: 1.25 V
Lead Channel Pacing Threshold Pulse Width: 0.4 ms
Lead Channel Pacing Threshold Pulse Width: 0.4 ms
Lead Channel Sensing Intrinsic Amplitude: 2 mV
Lead Channel Sensing Intrinsic Amplitude: 2 mV
Lead Channel Setting Pacing Amplitude: 2 V
Lead Channel Setting Pacing Pulse Width: 0.4 ms
MDC IDC LEAD IMPLANT DT: 20130717
MDC IDC LEAD LOCATION: 753859
MDC IDC LEAD MODEL: 6935
MDC IDC MSMT LEADCHNL LV IMPEDANCE VALUE: 494 Ohm
MDC IDC MSMT LEADCHNL LV IMPEDANCE VALUE: 494 Ohm
MDC IDC MSMT LEADCHNL LV PACING THRESHOLD PULSEWIDTH: 0.4 ms
MDC IDC MSMT LEADCHNL RA IMPEDANCE VALUE: 399 Ohm
MDC IDC MSMT LEADCHNL RA PACING THRESHOLD AMPLITUDE: 0.625 V
MDC IDC MSMT LEADCHNL RV SENSING INTR AMPL: 5.875 mV
MDC IDC MSMT LEADCHNL RV SENSING INTR AMPL: 5.875 mV
MDC IDC SET LEADCHNL LV PACING AMPLITUDE: 2.25 V
MDC IDC SET LEADCHNL LV PACING PULSEWIDTH: 0.4 ms
MDC IDC SET LEADCHNL RV PACING AMPLITUDE: 2 V
MDC IDC SET LEADCHNL RV SENSING SENSITIVITY: 0.3 mV
MDC IDC STAT BRADY AP VS PERCENT: 0.35 %
MDC IDC STAT BRADY AS VS PERCENT: 1.06 %

## 2015-10-21 ENCOUNTER — Other Ambulatory Visit: Payer: Self-pay | Admitting: Cardiology

## 2015-10-22 NOTE — Telephone Encounter (Signed)
REFILL 

## 2015-11-20 ENCOUNTER — Other Ambulatory Visit: Payer: Self-pay | Admitting: Cardiology

## 2015-12-24 ENCOUNTER — Other Ambulatory Visit: Payer: Self-pay | Admitting: Cardiology

## 2015-12-24 NOTE — Telephone Encounter (Signed)
Would send to PCP to refill. Thanks!

## 2015-12-30 ENCOUNTER — Encounter: Payer: Self-pay | Admitting: Internal Medicine

## 2015-12-30 ENCOUNTER — Ambulatory Visit (INDEPENDENT_AMBULATORY_CARE_PROVIDER_SITE_OTHER): Payer: Commercial Managed Care - HMO | Admitting: Internal Medicine

## 2015-12-30 VITALS — BP 118/74 | HR 71 | Ht 67.0 in | Wt 215.2 lb

## 2015-12-30 DIAGNOSIS — I5022 Chronic systolic (congestive) heart failure: Secondary | ICD-10-CM | POA: Diagnosis not present

## 2015-12-30 DIAGNOSIS — I4729 Other ventricular tachycardia: Secondary | ICD-10-CM

## 2015-12-30 DIAGNOSIS — Z95810 Presence of automatic (implantable) cardiac defibrillator: Secondary | ICD-10-CM | POA: Diagnosis not present

## 2015-12-30 DIAGNOSIS — I2589 Other forms of chronic ischemic heart disease: Secondary | ICD-10-CM | POA: Diagnosis not present

## 2015-12-30 DIAGNOSIS — I255 Ischemic cardiomyopathy: Secondary | ICD-10-CM | POA: Diagnosis not present

## 2015-12-30 DIAGNOSIS — I472 Ventricular tachycardia: Secondary | ICD-10-CM

## 2015-12-30 LAB — COMPREHENSIVE METABOLIC PANEL
ALBUMIN: 4.1 g/dL (ref 3.6–5.1)
ALT: 30 U/L (ref 9–46)
AST: 21 U/L (ref 10–35)
Alkaline Phosphatase: 40 U/L (ref 40–115)
BUN: 13 mg/dL (ref 7–25)
CHLORIDE: 105 mmol/L (ref 98–110)
CO2: 26 mmol/L (ref 20–31)
CREATININE: 0.98 mg/dL (ref 0.70–1.18)
Calcium: 9.1 mg/dL (ref 8.6–10.3)
GLUCOSE: 110 mg/dL — AB (ref 65–99)
Potassium: 4.6 mmol/L (ref 3.5–5.3)
SODIUM: 140 mmol/L (ref 135–146)
Total Bilirubin: 0.6 mg/dL (ref 0.2–1.2)
Total Protein: 6.3 g/dL (ref 6.1–8.1)

## 2015-12-30 LAB — TSH: TSH: 1.61 mIU/L (ref 0.40–4.50)

## 2015-12-30 NOTE — Patient Instructions (Signed)
Medication Instructions: - Your physician has recommended you make the following change in your medication:  1) Decrease amiodarone to 200 mg one tablet by mouth 5 days a week    Labwork: - Your physician recommends that you have lab work today: CMET/ TSH  Procedures/Testing: - none  Follow-Up: - Remote monitoring is used to monitor your Pacemaker of ICD from home. This monitoring reduces the number of office visits required to check your device to one time per year. It allows [place] to keep an eye on the functioning of your device to ensure it is working properly. You are scheduled for a device check from home on 03/31/16. You may send your transmission at any time that day. If you have a wireless device, the transmission will be sent automatically. After your physician reviews your transmission, you will receive a postcard with your next transmission date.  - Your physician wants you to follow-up in: 6 months with Dr. Caryl Comes. You will receive a reminder letter in the mail two months in advance. If you don't receive a letter, please call our office to schedule the follow-up appointment.  Any Additional Special Instructions Will Be Listed Below (If Applicable).     If you need a refill on your cardiac medications before your next appointment, please call your pharmacy.

## 2015-12-30 NOTE — Progress Notes (Signed)
Patient Care Team: Madelyn Brunner, MD as PCP - General (Internal Medicine)   HPI  Jerry Farley is a 70 y.o. male Seen in followup for ICD implanted to remotely for which he underwent device generator replacement 7/13. Indication was polymorphic ventricular tachycardia for which he takes amiodarone. This occurrs in the context of nonischemic cardiomyopathy. He also has a history of a 6949-lead which was revised the time of generator replacement   He also underwent CRT upgrade of atrial lead and an LV lead.   He also has coronary artery disease with prior stenting of his circumflex.  The patient denies chest pain,   nocturnal dyspnea, orthopnea or peripheral edema.  His shortness of breath which is stable.  Concerns with sleep disordered breathing and dyspnea prompted recommendation for sleep study and coronary function testing including for amiodarone surveillance. The patient has declined this.    Echocardiogram 7/14 EF 40-45%--LAE (40/1.9) Myoview 2011 demonstrated a large inferoapical scar and without ischemia. Ejection fraction at that time was 29%  Laboratories were reviewed from January 2017. Potassium was 5.6 TSH had decreased from 3.2--0.67   Past Medical History  Diagnosis Date  . NICM (nonischemic cardiomyopathy) ([place])     ejection fraction 40-45% by echo July 2012  . CAD (coronary artery disease) July 2007    CFX stent (patent Sept '07)  . Ventricular tachycardia, non-sustained ([place]) 2007    Amiodarone  . 6948-lead   . Chronic systolic congestive heart failure ([place])   . ICD (implantable cardiac defibrillator) in place 2006, 2013    Medtronic Protecta XT-DR CRT (01/2012)  . Complication of anesthesia     "problems waking up w/valium"  . High cholesterol   . Hypothyroidism   . Arthritis     "maybe a little"    Past Surgical History  Procedure Laterality Date  . Cardiac defibrillator placement  09/2004; 02/10/12    ICD implanted in 2006 (Dr. Rito Ehrlich); EOL  generator change in 01/2012 - CRT (BiV ICD) (Dr. Caryl Comes)  . Tonsillectomy and adenoidectomy      "when I was a child"  . Coronary angioplasty with stent placement   01/2006    CFX   . Transthoracic echocardiogram  01/2013    EF 40-45%, LV mildly dilated, mild LVH, mod hypokinesis of inferior myocardium, grade 1 diastolic dysfunction; mild MR  . Nm myocar perf ejection fraction  05/2010    dipyridamole myoview; mod perfusion defect in apical, basal inferior, mid inferior & apical inferior regions (infarct/scar); post-stress 28%, low risk scan  . Cardiac catheterization  11/30/2001    non-ischemic cardiomyopahty with high LV end-diastolic pressure; A999333 distal L main stenosis, large osital stenosis 70%, 70% Cfx stenosis (Dr. Domenic Moras)  . Cardiac catheterization  9/07    CFX stent patent [facility])  . Bi-ventricular implantable cardioverter defibrillator upgrade N/A 02/10/2012    Procedure: BI-VENTRICULAR IMPLANTABLE CARDIOVERTER DEFIBRILLATOR UPGRADE;  Surgeon: Deboraha Sprang, MD;  Location: [facility] CATH LAB;  Service: Cardiovascular;  Laterality: N/A;    Current Outpatient Prescriptions  Medication Sig Dispense Refill  . aspirin 81 MG tablet Take 81 mg by mouth daily.      Marland Kitchen atorvastatin (LIPITOR) 40 MG tablet TAKE ONE TABLET BY MOUTH ONCE DAILY.   INCREASED  DOSAGE. 30 tablet 6  . carvedilol (COREG) 25 MG tablet TAKE ONE TABLET BY MOUTH TWICE DAILY WITH MEALS 60 tablet 11  . clopidogrel (PLAVIX) 75 MG tablet TAKE ONE TABLET BY MOUTH ONCE  DAILY 30 tablet 11  . fish oil-omega-3 fatty acids 1000 MG capsule Take 1 g by mouth 3 (three) times a week.     . levothyroxine (SYNTHROID, LEVOTHROID) 75 MCG tablet TAKE ONE TABLET BY MOUTH DAILY BEFORE BREAKFAST. NEED OFFICE VISIT. 30 tablet 0  . Magnesium Oxide 400 MG CAPS Take 1 capsule by mouth daily.     . Multiple Vitamin (MULTIVITAMIN) tablet Take 1 tablet by mouth daily.      Marland Kitchen PACERONE 200 MG tablet TAKE ONE TABLET BY MOUTH ONCE DAILY 30 tablet 6  .  ramipril (ALTACE) 10 MG capsule TAKE ONE CAPSULE BY MOUTH ONCE DAILY 30 capsule 11  . spironolactone (ALDACTONE) 25 MG tablet Take 0.5 tablets (12.5 mg total) by mouth daily. 15 tablet 8  . you have a pacemaker book MISC 1 each by Does not apply route once. 1 each 0   No current facility-administered medications for this visit.    No Known Allergies  Review of Systems negative except from HPI and PMH  Physical Exam There were no vitals taken for this visit. Well developed and well nourished in no acute distress HENT normal E scleral and icterus clear Neck Supple JVP flat; carotids brisk and full Clear to ausculation Device pocket well healed; without hematoma or erythema.  There is no tethering  Regular rate and rhythm, no murmurs gallops or rub Soft with active bowel sounds No clubbing cyanosis uterine tingling Edema Alert and oriented, grossly normal motor and sensory function Skin Warm and Dry  ECG today demonstrates sinus rhythm with P-synchronous/ AV  pacing  QRS is surprisingly broad at 185 ms  Assessment and  Plan  Polymorphic ventricular tachycardia intercurrent nonsustained episodes  Nonischemic cardiomyopathy  Coronary artery disease with prior stenting  Amiodarone  Dyslipidemia  CRT-[employer]  Sleep disordered breathing  Without intercurrent ventricular tachycardia; we will decrease his amiodarone 7--5 days a week   He has done  well. We will check his amiodarone surveillance laboratories As well as repeat his potassium level  Pulmonary function testing was ordered last fall and never consummated we will do that;  he is not inclined to pursue sleep testing  LDL was 80 07/30/2015. We'll continue him on his current dose of atorvastatin  Without symptoms of ischemia

## 2016-02-18 ENCOUNTER — Other Ambulatory Visit: Payer: Self-pay | Admitting: Internal Medicine

## 2016-03-27 LAB — CUP PACEART INCLINIC DEVICE CHECK
Battery Voltage: 3.02 V
Brady Statistic AP VP Percent: 89.05 %
Brady Statistic AS VP Percent: 9.84 %
Brady Statistic RA Percent Paced: 89.41 %
Date Time Interrogation Session: 20170605150110
HIGH POWER IMPEDANCE MEASURED VALUE: 475 Ohm
HighPow Impedance: 79 Ohm
Implantable Lead Implant Date: 20130717
Implantable Lead Implant Date: 20130717
Implantable Lead Location: 753859
Implantable Lead Model: 4396
Implantable Lead Model: 6935
Lead Channel Impedance Value: 418 Ohm
Lead Channel Impedance Value: 912 Ohm
Lead Channel Pacing Threshold Amplitude: 0.625 V
Lead Channel Pacing Threshold Amplitude: 1.375 V
Lead Channel Pacing Threshold Pulse Width: 0.4 ms
Lead Channel Setting Pacing Amplitude: 2 V
Lead Channel Setting Pacing Pulse Width: 0.4 ms
MDC IDC LEAD IMPLANT DT: 20130717
MDC IDC LEAD LOCATION: 753858
MDC IDC LEAD LOCATION: 753860
MDC IDC MSMT LEADCHNL LV IMPEDANCE VALUE: 494 Ohm
MDC IDC MSMT LEADCHNL LV IMPEDANCE VALUE: 570 Ohm
MDC IDC MSMT LEADCHNL LV PACING THRESHOLD PULSEWIDTH: 0.4 ms
MDC IDC MSMT LEADCHNL RA PACING THRESHOLD AMPLITUDE: 0.625 V
MDC IDC MSMT LEADCHNL RA PACING THRESHOLD PULSEWIDTH: 0.4 ms
MDC IDC MSMT LEADCHNL RA SENSING INTR AMPL: 3 mV
MDC IDC MSMT LEADCHNL RV IMPEDANCE VALUE: 589 Ohm
MDC IDC MSMT LEADCHNL RV SENSING INTR AMPL: 6.75 mV
MDC IDC SET LEADCHNL LV PACING AMPLITUDE: 2.5 V
MDC IDC SET LEADCHNL RA PACING AMPLITUDE: 2 V
MDC IDC SET LEADCHNL RV PACING PULSEWIDTH: 0.4 ms
MDC IDC SET LEADCHNL RV SENSING SENSITIVITY: 0.3 mV
MDC IDC STAT BRADY AP VS PERCENT: 0.36 %
MDC IDC STAT BRADY AS VS PERCENT: 0.75 %
MDC IDC STAT BRADY RV PERCENT PACED: 98.89 %

## 2016-03-31 ENCOUNTER — Telehealth: Payer: Self-pay | Admitting: Cardiology

## 2016-03-31 ENCOUNTER — Ambulatory Visit (INDEPENDENT_AMBULATORY_CARE_PROVIDER_SITE_OTHER): Payer: Commercial Managed Care - HMO | Admitting: *Deleted

## 2016-03-31 DIAGNOSIS — Z95810 Presence of automatic (implantable) cardiac defibrillator: Secondary | ICD-10-CM

## 2016-03-31 DIAGNOSIS — I255 Ischemic cardiomyopathy: Secondary | ICD-10-CM | POA: Diagnosis not present

## 2016-03-31 NOTE — Telephone Encounter (Signed)
Spoke with pt and reminded pt of remote transmission that is due today. Pt verbalized understanding.   

## 2016-04-01 NOTE — Progress Notes (Signed)
Remote ICD transmission.   

## 2016-04-03 ENCOUNTER — Encounter: Payer: Self-pay | Admitting: Cardiology

## 2016-04-07 LAB — CUP PACEART REMOTE DEVICE CHECK
Battery Voltage: 3 V
Brady Statistic AP VP Percent: 87.83 %
Brady Statistic RA Percent Paced: 87.99 %
Brady Statistic RV Percent Paced: 99.32 %
Date Time Interrogation Session: 20170905211950
HIGH POWER IMPEDANCE MEASURED VALUE: 418 Ohm
HighPow Impedance: 73 Ohm
Implantable Lead Implant Date: 20130717
Implantable Lead Implant Date: 20130717
Implantable Lead Location: 753858
Implantable Lead Model: 4396
Implantable Lead Model: 5076
Lead Channel Pacing Threshold Amplitude: 0.75 V
Lead Channel Pacing Threshold Pulse Width: 0.4 ms
Lead Channel Pacing Threshold Pulse Width: 0.4 ms
Lead Channel Sensing Intrinsic Amplitude: 2 mV
Lead Channel Setting Pacing Amplitude: 2 V
Lead Channel Setting Pacing Amplitude: 2.5 V
MDC IDC LEAD IMPLANT DT: 20130717
MDC IDC LEAD LOCATION: 753859
MDC IDC LEAD LOCATION: 753860
MDC IDC LEAD MODEL: 6935
MDC IDC MSMT LEADCHNL LV IMPEDANCE VALUE: 437 Ohm
MDC IDC MSMT LEADCHNL LV IMPEDANCE VALUE: 570 Ohm
MDC IDC MSMT LEADCHNL LV IMPEDANCE VALUE: 874 Ohm
MDC IDC MSMT LEADCHNL LV PACING THRESHOLD AMPLITUDE: 1.5 V
MDC IDC MSMT LEADCHNL LV PACING THRESHOLD PULSEWIDTH: 0.4 ms
MDC IDC MSMT LEADCHNL RA IMPEDANCE VALUE: 399 Ohm
MDC IDC MSMT LEADCHNL RA PACING THRESHOLD AMPLITUDE: 0.625 V
MDC IDC MSMT LEADCHNL RA SENSING INTR AMPL: 2 mV
MDC IDC MSMT LEADCHNL RV IMPEDANCE VALUE: 513 Ohm
MDC IDC MSMT LEADCHNL RV SENSING INTR AMPL: 15.125 mV
MDC IDC MSMT LEADCHNL RV SENSING INTR AMPL: 15.125 mV
MDC IDC SET LEADCHNL LV PACING PULSEWIDTH: 0.4 ms
MDC IDC SET LEADCHNL RV PACING AMPLITUDE: 2 V
MDC IDC SET LEADCHNL RV PACING PULSEWIDTH: 0.4 ms
MDC IDC SET LEADCHNL RV SENSING SENSITIVITY: 0.3 mV
MDC IDC STAT BRADY AP VS PERCENT: 0.16 %
MDC IDC STAT BRADY AS VP PERCENT: 11.49 %
MDC IDC STAT BRADY AS VS PERCENT: 0.52 %

## 2016-04-23 ENCOUNTER — Other Ambulatory Visit: Payer: Self-pay | Admitting: *Deleted

## 2016-04-23 ENCOUNTER — Other Ambulatory Visit: Payer: Self-pay | Admitting: Cardiovascular Disease

## 2016-04-23 ENCOUNTER — Other Ambulatory Visit: Payer: Self-pay | Admitting: Internal Medicine

## 2016-04-23 MED ORDER — SPIRONOLACTONE 25 MG PO TABS: 12.5000 mg | ORAL_TABLET | Freq: Every day | ORAL | 8 refills | Status: DC

## 2016-04-23 MED ORDER — AMIODARONE HCL 200 MG PO TABS: ORAL_TABLET | ORAL | 2 refills | Status: DC

## 2016-07-01 ENCOUNTER — Encounter: Payer: Self-pay | Admitting: Internal Medicine

## 2016-07-01 ENCOUNTER — Ambulatory Visit (INDEPENDENT_AMBULATORY_CARE_PROVIDER_SITE_OTHER): Payer: Commercial Managed Care - HMO | Admitting: Internal Medicine

## 2016-07-01 VITALS — BP 110/72 | HR 72 | Ht 66.0 in | Wt 212.4 lb

## 2016-07-01 DIAGNOSIS — Z95810 Presence of automatic (implantable) cardiac defibrillator: Secondary | ICD-10-CM

## 2016-07-01 DIAGNOSIS — I5022 Chronic systolic (congestive) heart failure: Secondary | ICD-10-CM

## 2016-07-01 DIAGNOSIS — E782 Mixed hyperlipidemia: Secondary | ICD-10-CM | POA: Diagnosis not present

## 2016-07-01 DIAGNOSIS — I472 Ventricular tachycardia: Secondary | ICD-10-CM

## 2016-07-01 DIAGNOSIS — I255 Ischemic cardiomyopathy: Secondary | ICD-10-CM | POA: Diagnosis not present

## 2016-07-01 DIAGNOSIS — I4729 Other ventricular tachycardia: Secondary | ICD-10-CM

## 2016-07-01 LAB — LIPID PANEL
Cholesterol: 130 mg/dL (ref <200)
HDL: 32 mg/dL — ABNORMAL LOW (ref >40)
LDL CALC: 71 mg/dL (ref <100)
Total CHOL/HDL Ratio: 4.1 Ratio (ref <5.0)
Triglycerides: 134 mg/dL (ref <150)
VLDL: 27 mg/dL (ref <30)

## 2016-07-01 LAB — COMPREHENSIVE METABOLIC PANEL
ALK PHOS: 39 U/L — AB (ref 40–115)
ALT: 35 U/L (ref 9–46)
AST: 27 U/L (ref 10–35)
Albumin: 4.3 g/dL (ref 3.6–5.1)
BILIRUBIN TOTAL: 0.6 mg/dL (ref 0.2–1.2)
BUN: 12 mg/dL (ref 7–25)
CO2: 24 mmol/L (ref 20–31)
Calcium: 9.5 mg/dL (ref 8.6–10.3)
Chloride: 104 mmol/L (ref 98–110)
Creat: 1.14 mg/dL (ref 0.70–1.18)
GLUCOSE: 112 mg/dL — AB (ref 65–99)
Potassium: 4.7 mmol/L (ref 3.5–5.3)
SODIUM: 139 mmol/L (ref 135–146)
Total Protein: 6.8 g/dL (ref 6.1–8.1)

## 2016-07-01 LAB — TSH: TSH: 3.14 m[IU]/L (ref 0.40–4.50)

## 2016-07-01 MED ORDER — AMIODARONE HCL 200 MG PO TABS: ORAL_TABLET | ORAL | Status: DC

## 2016-07-01 NOTE — Patient Instructions (Addendum)
Medication Instructions: - Your physician has recommended you make the following change in your medication:  1) Amiodarone 200 mg- take 1/2 tablet (100 mg) by mouth once daily  Labwork: - Your physician recommends that you have lab work today: CMET/ TSH  Procedures/Testing: - none ordered  Follow-Up: - Remote monitoring is used to monitor your Pacemaker of ICD from home. This monitoring reduces the number of office visits required to check your device to one time per year. It allows [place] to keep an eye on the functioning of your device to ensure it is working properly. You are scheduled for a device check from home on 09/30/16. You may send your transmission at any time that day. If you have a wireless device, the transmission will be sent automatically. After your physician reviews your transmission, you will receive a postcard with your next transmission date.  - Your physician wants you to follow-up in: 6 months with Chanetta Marshall, NP for Dr. Caryl Comes & 1 year with Dr. Caryl Comes.. You will receive a reminder letter in the mail two months in advance. If you don't receive a letter, please call our office to schedule the follow-up appointment.  Any Additional Special Instructions Will Be Listed Below (If Applicable).     If you need a refill on your cardiac medications before your next appointment, please call your pharmacy.

## 2016-07-01 NOTE — Progress Notes (Signed)
Was      Patient Care Team: Madelyn Brunner, MD as PCP - General (Internal Medicine)   HPI  Jerry Farley is a 70 y.o. male Seen in followup for ICD implanted to remotely for which he underwent device generator replacement 7/13. Indication was polymorphic ventricular tachycardia for which he takes amiodarone. This occurrs in the context of nonischemic cardiomyopathy. He also has a history of a 6949-lead which was revised the time of generator replacement   He also underwent CRT upgrade of atrial lead and an LV lead.   He also has coronary artery disease with prior stenting of his circumflex.  The patient denies chest pain,   nocturnal dyspnea, orthopnea or peripheral edema.  His shortness of breath which is stable.    Echocardiogram 7/14 EF 40-45%--LAE (40/1.9) Myoview 2011 demonstrated a large inferoapical scar and without ischemia. Ejection fraction at that time was 29%  Laboratories were reviewed from January 2017. Potassium was 5.6 TSH had decreased from 3.2--0.67  Date TSH LFTs PFTs  11/16  0.48 28    6/17  1.6 30     Patient denies symptoms of GI intolerance, sun sensitivity, neurological symptoms attributable to amiodarone.  Surveillance laboratories were in normal limits when checked 6 months ago    Past Medical History:  Diagnosis Date  . 6948-lead   . Arthritis    "maybe a little"  . CAD (coronary artery disease) July 2007   CFX stent (patent Sept '07)  . Chronic systolic congestive heart failure ([place])   . Complication of anesthesia    "problems waking up w/valium"  . High cholesterol   . Hypothyroidism   . ICD (implantable cardiac defibrillator) in place 2006, 2013   Medtronic Protecta XT-DR CRT (01/2012)  . NICM (nonischemic cardiomyopathy) ([place])    ejection fraction 40-45% by echo July 2012  . Ventricular tachycardia, non-sustained ([place]) 2007   Amiodarone    Past Surgical History:  Procedure Laterality Date  . BI-VENTRICULAR IMPLANTABLE  CARDIOVERTER DEFIBRILLATOR UPGRADE N/A 02/10/2012   Procedure: BI-VENTRICULAR IMPLANTABLE CARDIOVERTER DEFIBRILLATOR UPGRADE;  Surgeon: Deboraha Sprang, MD;  Location: [facility] CATH LAB;  Service: Cardiovascular;  Laterality: N/A;  . CARDIAC CATHETERIZATION  11/30/2001   non-ischemic cardiomyopahty with high LV end-diastolic pressure; A999333 distal L main stenosis, large osital stenosis 70%, 70% Cfx stenosis (Dr. Domenic Moras)  . CARDIAC CATHETERIZATION  9/07   CFX stent patent [employer])  . CARDIAC DEFIBRILLATOR PLACEMENT  09/2004; 02/10/12   ICD implanted in 2006 (Dr. Rito Ehrlich); EOL generator change in 01/2012 - CRT (BiV ICD) (Dr. Caryl Comes)  . CORONARY ANGIOPLASTY WITH STENT PLACEMENT   01/2006   CFX   . NM MYOCAR PERF EJECTION FRACTION  05/2010   dipyridamole myoview; mod perfusion defect in apical, basal inferior, mid inferior & apical inferior regions (infarct/scar); post-stress 28%, low risk scan  . TONSILLECTOMY AND ADENOIDECTOMY     "when I was a child"  . TRANSTHORACIC ECHOCARDIOGRAM  01/2013   EF 40-45%, LV mildly dilated, mild LVH, mod hypokinesis of inferior myocardium, grade 1 diastolic dysfunction; mild MR    Current Outpatient Prescriptions  Medication Sig Dispense Refill  . amiodarone (PACERONE) 200 MG tablet Take one tablet (200 mg) by mouth 5 days a week 60 tablet 2  . aspirin 81 MG tablet Take 81 mg by mouth daily.      Marland Kitchen atorvastatin (LIPITOR) 40 MG tablet TAKE ONE TABLET BY MOUTH ONCE DAILY.   INCREASED  DOSAGE. 90 tablet 3  .  carvedilol (COREG) 25 MG tablet TAKE ONE TABLET BY MOUTH TWICE DAILY WITH MEALS 60 tablet 11  . clopidogrel (PLAVIX) 75 MG tablet TAKE ONE TABLET BY MOUTH ONCE DAILY 30 tablet 11  . fish oil-omega-3 fatty acids 1000 MG capsule Take 1 g by mouth daily.     Marland Kitchen levothyroxine (SYNTHROID, LEVOTHROID) 75 MCG tablet TAKE ONE TABLET BY MOUTH DAILY BEFORE BREAKFAST. NEED OFFICE VISIT. 30 tablet 0  . Magnesium Oxide 400 MG CAPS Take 1 capsule by mouth daily.     . Multiple Vitamin  (MULTIVITAMIN) tablet Take 1 tablet by mouth daily.      . ramipril (ALTACE) 10 MG capsule TAKE ONE CAPSULE BY MOUTH ONCE DAILY 30 capsule 11  . spironolactone (ALDACTONE) 25 MG tablet Take 0.5 tablets (12.5 mg total) by mouth daily. 15 tablet 8  . you have a pacemaker book MISC 1 each by Does not apply route once. 1 each 0   No current facility-administered medications for this visit.     No Known Allergies  Review of Systems negative except from HPI and PMH  Physical Exam BP 110/72   Pulse 72   Ht 5\' 6"  (1.676 m)   Wt 212 lb 6.4 oz (96.3 kg)   SpO2 96%   BMI 34.28 kg/m  Well developed and well nourished in no acute distress HENT normal E scleral and icterus clear Neck Supple JVP flat; carotids brisk and full Clear to ausculation Device pocket well healed; without hematoma or erythema.  There is no tethering  Regular rate and rhythm, no murmurs gallops or rub Soft with active bowel sounds No clubbing cyanosis uterine tingling Edema Alert and oriented, grossly normal motor and sensory function Skin Warm and Dry  ECG today demonstrates sinus rhythm with P-synchronous/ AV  pacing  QRS is surprisingly broad at 185 ms  With upright QRS V1  Assessment and  Plan  Polymorphic ventricular tachycardia-- intercurrent nonsustained episodes  Nonischemic cardiomyopathy  Coronary artery disease with prior stenting  Amiodarone  Dyslipidemia  CRT-[employer]    Without intercurrent ventricular tachycardia; we will decrease his amiodarone  5 days a week>> 100 mg/daily    He has done  well. We will check his amiodarone surveillance laboratories As well as repeat his potassium level  LDL was 65. 11/16  Will recheck and   continue him on his current dose of atorvastatin  Without symptoms of ischemia  Euvolemic continue current meds

## 2016-07-16 ENCOUNTER — Telehealth: Payer: Self-pay | Admitting: Internal Medicine

## 2016-07-16 NOTE — Telephone Encounter (Signed)
I spoke with the patient. 

## 2016-07-16 NOTE — Telephone Encounter (Signed)
Follow Up:; ° ° °Returning your call. °

## 2016-08-07 DIAGNOSIS — Z125 Encounter for screening for malignant neoplasm of prostate: Secondary | ICD-10-CM | POA: Diagnosis not present

## 2016-08-07 DIAGNOSIS — E7800 Pure hypercholesterolemia, unspecified: Secondary | ICD-10-CM | POA: Diagnosis not present

## 2016-08-07 DIAGNOSIS — Z95810 Presence of automatic (implantable) cardiac defibrillator: Secondary | ICD-10-CM | POA: Diagnosis not present

## 2016-08-07 DIAGNOSIS — Z0001 Encounter for general adult medical examination with abnormal findings: Secondary | ICD-10-CM | POA: Diagnosis not present

## 2016-08-07 DIAGNOSIS — E034 Atrophy of thyroid (acquired): Secondary | ICD-10-CM | POA: Diagnosis not present

## 2016-08-07 DIAGNOSIS — I2510 Atherosclerotic heart disease of native coronary artery without angina pectoris: Secondary | ICD-10-CM | POA: Diagnosis not present

## 2016-08-07 DIAGNOSIS — Z0000 Encounter for general adult medical examination without abnormal findings: Secondary | ICD-10-CM | POA: Diagnosis not present

## 2016-09-03 ENCOUNTER — Other Ambulatory Visit: Payer: Self-pay | Admitting: Internal Medicine

## 2016-09-30 ENCOUNTER — Ambulatory Visit (INDEPENDENT_AMBULATORY_CARE_PROVIDER_SITE_OTHER): Payer: Commercial Managed Care - HMO | Admitting: *Deleted

## 2016-09-30 DIAGNOSIS — I255 Ischemic cardiomyopathy: Secondary | ICD-10-CM | POA: Diagnosis not present

## 2016-09-30 NOTE — Progress Notes (Signed)
Remote ICD transmission.   

## 2016-10-01 ENCOUNTER — Encounter: Payer: Self-pay | Admitting: Cardiology

## 2016-10-02 LAB — CUP PACEART REMOTE DEVICE CHECK
Battery Voltage: 2.95 V
Brady Statistic AS VS Percent: 0.89 %
Brady Statistic RV Percent Paced: 91.09 %
Date Time Interrogation Session: 20180307093730
HIGH POWER IMPEDANCE MEASURED VALUE: 80 Ohm
HighPow Impedance: 513 Ohm
Implantable Lead Implant Date: 20130717
Implantable Lead Implant Date: 20130717
Implantable Lead Location: 753858
Implantable Lead Location: 753860
Implantable Lead Model: 5076
Implantable Lead Model: 6935
Implantable Pulse Generator Implant Date: 20130717
Lead Channel Impedance Value: 399 Ohm
Lead Channel Impedance Value: 399 Ohm
Lead Channel Pacing Threshold Amplitude: 0.75 V
Lead Channel Pacing Threshold Pulse Width: 0.4 ms
Lead Channel Sensing Intrinsic Amplitude: 1.75 mV
Lead Channel Sensing Intrinsic Amplitude: 6.375 mV
Lead Channel Setting Pacing Amplitude: 2 V
Lead Channel Setting Pacing Amplitude: 2.5 V
Lead Channel Setting Pacing Pulse Width: 0.4 ms
MDC IDC LEAD IMPLANT DT: 20130717
MDC IDC LEAD LOCATION: 753859
MDC IDC MSMT LEADCHNL LV IMPEDANCE VALUE: 513 Ohm
MDC IDC MSMT LEADCHNL LV IMPEDANCE VALUE: 722 Ohm
MDC IDC MSMT LEADCHNL LV PACING THRESHOLD AMPLITUDE: 1.375 V
MDC IDC MSMT LEADCHNL LV PACING THRESHOLD PULSEWIDTH: 0.4 ms
MDC IDC MSMT LEADCHNL RA PACING THRESHOLD AMPLITUDE: 0.625 V
MDC IDC MSMT LEADCHNL RA SENSING INTR AMPL: 1.75 mV
MDC IDC MSMT LEADCHNL RV IMPEDANCE VALUE: 627 Ohm
MDC IDC MSMT LEADCHNL RV PACING THRESHOLD PULSEWIDTH: 0.4 ms
MDC IDC MSMT LEADCHNL RV SENSING INTR AMPL: 6.375 mV
MDC IDC SET LEADCHNL RV PACING AMPLITUDE: 2 V
MDC IDC SET LEADCHNL RV PACING PULSEWIDTH: 0.4 ms
MDC IDC SET LEADCHNL RV SENSING SENSITIVITY: 0.3 mV
MDC IDC STAT BRADY AP VP PERCENT: 88.52 %
MDC IDC STAT BRADY AP VS PERCENT: 0.15 %
MDC IDC STAT BRADY AS VP PERCENT: 10.44 %
MDC IDC STAT BRADY RA PERCENT PACED: 78.97 %

## 2016-11-09 DIAGNOSIS — R69 Illness, unspecified: Secondary | ICD-10-CM | POA: Diagnosis not present

## 2016-12-03 ENCOUNTER — Encounter: Payer: Self-pay | Admitting: Physician Assistant

## 2016-12-24 ENCOUNTER — Encounter: Payer: Commercial Managed Care - HMO | Admitting: Nurse Practitioner

## 2016-12-27 NOTE — Progress Notes (Signed)
Cardiology Office Note Date:  12/28/2016  Patient ID:  Brenson, Hartman May 30, 1946, MRN [id number] PCP:  Madelyn Brunner, MD  Electrophysiologist:  Dr. Caryl Comes   Chief Complaint:  Routine 6 month visit  History of Present Illness: NAVARRO NINE is a 71 y.o. male with history of CAD, NICM w/ICD, chronic CHF (systolic), HLD comes in today to be seen for Dr. Caryl Comes, last seen by him in Dec, at that time doing well.  The patient reports feeling well.  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  Denies any kind of CP, palpitations, no DOE/SOB, denies symptoms of PND or orthopnea, no dizziness, near syncope or syncope.  No shocks from his device.  Device information: MDT single chamber ICD implanted 2006 for PMVT/secondary prevention; upgrade to MDT CRT-D 2013 with capping of 6948 lead and addition of RA lead History of appropriate therapy: No History of AAD therapy: yes - amiodarone  07/01/16: LFTs wnl, TSH wnl Patient has declined PFTs  Past Medical History:  Diagnosis Date  . 6948-lead   . Arthritis    "maybe a little"  . CAD (coronary artery disease) July 2007   CFX stent (patent Sept '07)  . Chronic systolic congestive heart failure ([place])   . Complication of anesthesia    "problems waking up w/valium"  . High cholesterol   . Hypothyroidism   . ICD (implantable cardiac defibrillator) in place 2006, 2013   Medtronic Protecta XT-DR CRT (01/2012)  . NICM (nonischemic cardiomyopathy) ([place])    ejection fraction 40-45% by echo July 2012  . Ventricular tachycardia, non-sustained ([place]) 2007   Amiodarone    Past Surgical History:  Procedure Laterality Date  . BI-VENTRICULAR IMPLANTABLE CARDIOVERTER DEFIBRILLATOR UPGRADE N/A 02/10/2012   Procedure: BI-VENTRICULAR IMPLANTABLE CARDIOVERTER DEFIBRILLATOR UPGRADE;  Surgeon: Deboraha Sprang, MD;  Location: [facility] CATH LAB;  Service: Cardiovascular;  Laterality: N/A;    . CARDIAC CATHETERIZATION  11/30/2001   non-ischemic cardiomyopahty with high LV end-diastolic pressure; 63% distal L main stenosis, large osital stenosis 70%, 70% Cfx stenosis (Dr. Domenic Moras)  . CARDIAC CATHETERIZATION  9/07   CFX stent patent [employer])  . CARDIAC DEFIBRILLATOR PLACEMENT  09/2004; 02/10/12   ICD implanted in 2006 (Dr. Rito Ehrlich); EOL generator change in 01/2012 - CRT (BiV ICD) (Dr. Caryl Comes)  . CORONARY ANGIOPLASTY WITH STENT PLACEMENT   01/2006   CFX   . NM MYOCAR PERF EJECTION FRACTION  05/2010   dipyridamole myoview; mod perfusion defect in apical, basal inferior, mid inferior & apical inferior regions (infarct/scar); post-stress 28%, low risk scan  . TONSILLECTOMY AND ADENOIDECTOMY     "when I was a child"  . TRANSTHORACIC ECHOCARDIOGRAM  01/2013   EF 40-45%, LV mildly dilated, mild LVH, mod hypokinesis of inferior myocardium, grade 1 diastolic dysfunction; mild MR    Current Outpatient Prescriptions  Medication Sig Dispense Refill  . amiodarone (PACERONE) 200 MG tablet Take 1/2 tablet (100 mg) by mouth once daily    . aspirin 81 MG tablet Take 81 mg by mouth daily.      Marland Kitchen atorvastatin (LIPITOR) 40 MG tablet TAKE ONE TABLET BY MOUTH ONCE DAILY.   INCREASED  DOSAGE. 90 tablet 3  . carvedilol (COREG) 25 MG tablet TAKE ONE TABLET BY MOUTH TWICE DAILY WITH MEALS 180 tablet 3  . clopidogrel (PLAVIX) 75 MG tablet TAKE ONE TABLET BY MOUTH ONCE DAILY 90 tablet 3  . fish oil-omega-3 fatty  acids 1000 MG capsule Take 1 g by mouth daily.     Marland Kitchen levothyroxine (SYNTHROID, LEVOTHROID) 75 MCG tablet TAKE ONE TABLET BY MOUTH DAILY BEFORE BREAKFAST. NEED OFFICE VISIT. 30 tablet 0  . Magnesium Oxide 400 MG CAPS Take 1 capsule by mouth daily.     . Multiple Vitamin (MULTIVITAMIN) tablet Take 1 tablet by mouth daily.      . ramipril (ALTACE) 10 MG capsule TAKE ONE CAPSULE BY MOUTH ONCE DAILY 90 capsule 3  . spironolactone (ALDACTONE) 25 MG tablet Take 0.5 tablets (12.5 mg total) by mouth daily. 15  tablet 8  . you have a pacemaker book MISC 1 each by Does not apply route once. 1 each 0   No current facility-administered medications for this visit.     Allergies:   Patient has no known allergies.   Social History:  The patient  reports that he quit smoking about 13 years ago. His smoking use included Cigarettes. He has a 63.00 pack-year smoking history. He has quit using smokeless tobacco. His smokeless tobacco use included Chew. He reports that he does not drink alcohol or use drugs.   Family History:  The patient's family history includes Cancer in his brother and mother.  ROS:  Please see the history of present illness.    All other systems are reviewed and otherwise negative.   PHYSICAL EXAM:  VS:  BP 118/74   Pulse 72   Ht 5\' 7"  (1.702 m)   Wt 219 lb (99.3 kg)   BMI 34.30 kg/m  BMI: Body mass index is 34.3 kg/m. Well nourished, well developed, in no acute distress  HEENT: normocephalic, atraumatic  Neck: no JVD, carotid bruits or masses Cardiac:   RRR; no significant murmurs, no rubs, or gallops Lungs:  CTA b/l, no wheezing, rhonchi or rales  Abd: soft, nontender MS: no deformity or atrophy Ext: no edema  Skin: warm and dry, no rash Neuro:  No gross deficits appreciated Psych: euthymic mood, full affect  ICD site is stable, no tethering or discomfort   EKG:  Done 07/01/16 AV paced ICD interrogation done today by industry and reviewed by myself: battery and lead measurements are good, NSVT episodes only since Dec 2017, He had a monitored VT episode, V rate 165bpm, in monitor zone.    06/27/15: TTE Study Conclusions - Left ventricle: The cavity size was normal. Wall thickness was   normal. Systolic function was mildly to moderately reduced. The   estimated ejection fraction was in the range of 40% to 45%. There   is inferior and inferoseptal hypokinesis. Doppler parameters are   consistent with abnormal left ventricular relaxation (grade 1   diastolic  dysfunction). The E/e&' ratio is between 8-15,   suggesting indeterminate LV filling pressure. - Mitral valve: Mildly thickened leaflets . There was trivial   regurgitation. - Left atrium: The atrium was normal in size. - Right ventricle: The cavity size was normal. Wall thickness was   normal. Pacer wire or catheter noted in right ventricle. Systolic   function is reduced. - Right atrium: The atrium was normal in size. Pacer wire or   catheter noted in right atrium. - Atrial septum: No defect or patent foramen ovale was identified. - Tricuspid valve: There was trivial regurgitation. - Pulmonary arteries: PA peak pressure: 21 mm Hg (S). - Inferior vena cava: The vessel was normal in size. The   respirophasic diameter changes were in the normal range (= 50%),   consistent with normal  central venous pressure. Impressions: - Compared to the prior study in 2014, there are no significant   changes.  01/28/06: PCI to LCx with cypher stent 01/18/06: LHC LM 30% eccentric irregularity similar to 2003 LAD no lesions LCx 90% mid lesion RCA mid 40%, similar to 2003   Recent Labs: 07/01/2016: ALT 35; BUN 12; Creat 1.14; Potassium 4.7; Sodium 139; TSH 3.14  07/01/2016: Cholesterol 130; HDL 32; LDL Cholesterol 71; Total CHOL/HDL Ratio 4.1; Triglycerides 134; VLDL 27   CrCl cannot be calculated (Patient's most recent lab result is older than the maximum 21 days allowed.).   Wt Readings from Last 3 Encounters:  12/28/16 219 lb (99.3 kg)  07/01/16 212 lb 6.4 oz (96.3 kg)  12/30/15 215 lb 3.2 oz (97.6 kg)     Other studies reviewed: Additional studies/records reviewed today include: summarized above  ASSESSMENT AND PLAN:  1. NICM w/ICD, VT     Stable device function     2. CAD     No symptoms of angina     07/01/16 lipids looked OK     On  ASA, Plavix, statin, BB  3. chronic CHF     On BB/ACE, aldactone     Exam is  compensated, no symptoms of fluid OL     Weight is up, though he  admits to dietary indiscretions, and is counseled     OptiVol looks good  Will check BMET given meds, and a mag level since he is on replacement  4. Hx of PMVT     On low dose amiodarone     He had MMVT 26 seconds in his monitor zone, no recalled symptoms     I will have him resume 200mg  daily of his amiodarone, check LFT's TSH in 2 months     06/2016, LFTs, TSH wnl     Pt declined PFT testing, no SOB c/o   Disposition: F/u with remote device check in 3 months, Dr. Caryl Comes in December, sooner if needed  Current medicines are reviewed at length with the patient today.  The patient did not have any concerns regarding medicines.  Haywood Lasso, PA-C 12/28/2016 8:22 AM     [facility] [address] [address] [phone] (office)  [phone] (fax)

## 2016-12-28 ENCOUNTER — Ambulatory Visit (INDEPENDENT_AMBULATORY_CARE_PROVIDER_SITE_OTHER): Payer: Medicare HMO | Admitting: Physician Assistant

## 2016-12-28 VITALS — BP 118/74 | HR 72 | Ht 67.0 in | Wt 219.0 lb

## 2016-12-28 DIAGNOSIS — Z79899 Other long term (current) drug therapy: Secondary | ICD-10-CM | POA: Diagnosis not present

## 2016-12-28 DIAGNOSIS — I2510 Atherosclerotic heart disease of native coronary artery without angina pectoris: Secondary | ICD-10-CM | POA: Diagnosis not present

## 2016-12-28 DIAGNOSIS — I472 Ventricular tachycardia, unspecified: Secondary | ICD-10-CM

## 2016-12-28 DIAGNOSIS — I5022 Chronic systolic (congestive) heart failure: Secondary | ICD-10-CM

## 2016-12-28 LAB — BASIC METABOLIC PANEL
BUN / CREAT RATIO: 11 (ref 10–24)
BUN: 12 mg/dL (ref 8–27)
CHLORIDE: 102 mmol/L (ref 96–106)
CO2: 23 mmol/L (ref 18–29)
Calcium: 9.5 mg/dL (ref 8.6–10.2)
Creatinine, Ser: 1.07 mg/dL (ref 0.76–1.27)
GFR calc Af Amer: 80 mL/min/{1.73_m2} (ref >59)
GFR calc non Af Amer: 69 mL/min/{1.73_m2} (ref >59)
GLUCOSE: 120 mg/dL — AB (ref 65–99)
Potassium: 4.7 mmol/L (ref 3.5–5.2)
SODIUM: 141 mmol/L (ref 134–144)

## 2016-12-28 LAB — MAGNESIUM: MAGNESIUM: 2.3 mg/dL (ref 1.6–2.3)

## 2016-12-28 MED ORDER — AMIODARONE HCL 200 MG PO TABS: 200.0000 mg | ORAL_TABLET | Freq: Every day | ORAL | 6 refills | Status: DC

## 2016-12-28 NOTE — Patient Instructions (Addendum)
Medication Instructions:   START TAKING AMIODARONE 200 MG ONCE A DAY    If you need a refill on your cardiac medications before your next appointment, please call your pharmacy.  Labwork:  BMET AND MAGNESIUM LEVEL TODAY    RETURN IN 2 MONTHS FOR FASTING LIPID AND LIVER    Testing/Procedures: NONE ORDERED  TODAY    Follow-Up: Your physician wants you to follow-up in:  IN  6  MONTHS WITH DR Gari Crown will receive a reminder letter in the mail two months in advance. If you don't receive a letter, please call our office to schedule the follow-up appointment.   Remote monitoring is used to monitor your Pacemaker of ICD from home. This monitoring reduces the number of office visits required to check your device to one time per year. It allows [place] to keep an eye on the functioning of your device to ensure it is working properly. You are scheduled for a device check from home on .03-30-17  You may send your transmission at any time that day. If you have a wireless device, the transmission will be sent automatically. After your physician reviews your transmission, you will receive a postcard with your next transmission date.     Any Other Special Instructions Will Be Listed Below (If Applicable).

## 2016-12-29 NOTE — Addendum Note (Signed)
Addended by: Claude Manges on: 12/29/2016 07:53 AM   Modules accepted: Orders

## 2016-12-30 ENCOUNTER — Ambulatory Visit (INDEPENDENT_AMBULATORY_CARE_PROVIDER_SITE_OTHER): Payer: Medicare HMO | Admitting: *Deleted

## 2016-12-30 DIAGNOSIS — I472 Ventricular tachycardia, unspecified: Secondary | ICD-10-CM

## 2016-12-30 NOTE — Progress Notes (Signed)
Remote ICD transmission.   

## 2017-01-01 LAB — CUP PACEART REMOTE DEVICE CHECK
Battery Voltage: 2.91 V
Brady Statistic AP VP Percent: 65.83 %
HIGH POWER IMPEDANCE MEASURED VALUE: 437 Ohm
HighPow Impedance: 73 Ohm
Implantable Lead Implant Date: 20130717
Implantable Lead Implant Date: 20130717
Implantable Lead Location: 753858
Implantable Lead Model: 4396
Implantable Lead Model: 6935
Implantable Pulse Generator Implant Date: 20130717
Lead Channel Impedance Value: 399 Ohm
Lead Channel Impedance Value: 551 Ohm
Lead Channel Pacing Threshold Amplitude: 0.625 V
Lead Channel Pacing Threshold Amplitude: 1.375 V
Lead Channel Sensing Intrinsic Amplitude: 2.375 mV
Lead Channel Sensing Intrinsic Amplitude: 5.25 mV
Lead Channel Setting Pacing Amplitude: 2.5 V
Lead Channel Setting Pacing Pulse Width: 0.4 ms
Lead Channel Setting Pacing Pulse Width: 0.4 ms
MDC IDC LEAD IMPLANT DT: 20130717
MDC IDC LEAD LOCATION: 753859
MDC IDC LEAD LOCATION: 753860
MDC IDC MSMT LEADCHNL LV IMPEDANCE VALUE: 437 Ohm
MDC IDC MSMT LEADCHNL LV IMPEDANCE VALUE: 494 Ohm
MDC IDC MSMT LEADCHNL LV IMPEDANCE VALUE: 798 Ohm
MDC IDC MSMT LEADCHNL LV PACING THRESHOLD PULSEWIDTH: 0.4 ms
MDC IDC MSMT LEADCHNL RA PACING THRESHOLD AMPLITUDE: 0.625 V
MDC IDC MSMT LEADCHNL RA PACING THRESHOLD PULSEWIDTH: 0.4 ms
MDC IDC MSMT LEADCHNL RA SENSING INTR AMPL: 2.375 mV
MDC IDC MSMT LEADCHNL RV PACING THRESHOLD PULSEWIDTH: 0.4 ms
MDC IDC MSMT LEADCHNL RV SENSING INTR AMPL: 5.25 mV
MDC IDC SESS DTM: 20180606073327
MDC IDC SET LEADCHNL RA PACING AMPLITUDE: 2 V
MDC IDC SET LEADCHNL RV PACING AMPLITUDE: 2 V
MDC IDC SET LEADCHNL RV SENSING SENSITIVITY: 0.3 mV
MDC IDC STAT BRADY AP VS PERCENT: 0.14 %
MDC IDC STAT BRADY AS VP PERCENT: 33.5 %
MDC IDC STAT BRADY AS VS PERCENT: 0.52 %
MDC IDC STAT BRADY RA PERCENT PACED: 64.43 %
MDC IDC STAT BRADY RV PERCENT PACED: 97.77 %

## 2017-01-04 ENCOUNTER — Telehealth: Payer: Self-pay | Admitting: *Deleted

## 2017-01-04 NOTE — Telephone Encounter (Signed)
ERROR

## 2017-01-05 ENCOUNTER — Encounter: Payer: Self-pay | Admitting: Cardiology

## 2017-01-19 ENCOUNTER — Other Ambulatory Visit: Payer: Self-pay | Admitting: Internal Medicine

## 2017-02-25 ENCOUNTER — Other Ambulatory Visit: Payer: Self-pay | Admitting: Internal Medicine

## 2017-03-01 ENCOUNTER — Other Ambulatory Visit: Payer: Medicare HMO | Admitting: *Deleted

## 2017-03-01 ENCOUNTER — Other Ambulatory Visit: Payer: Medicare HMO

## 2017-03-01 DIAGNOSIS — Z79899 Other long term (current) drug therapy: Secondary | ICD-10-CM | POA: Diagnosis not present

## 2017-03-01 LAB — HEPATIC FUNCTION PANEL
ALK PHOS: 49 IU/L (ref 39–117)
ALT: 30 IU/L (ref 0–44)
AST: 24 IU/L (ref 0–40)
Albumin: 4.2 g/dL (ref 3.5–4.8)
BILIRUBIN TOTAL: 0.5 mg/dL (ref 0.0–1.2)
BILIRUBIN, DIRECT: 0.12 mg/dL (ref 0.00–0.40)
Total Protein: 6.4 g/dL (ref 6.0–8.5)

## 2017-03-01 LAB — TSH: TSH: 4.96 u[IU]/mL — ABNORMAL HIGH (ref 0.450–4.500)

## 2017-03-04 ENCOUNTER — Other Ambulatory Visit: Payer: Medicare HMO

## 2017-03-31 ENCOUNTER — Ambulatory Visit (INDEPENDENT_AMBULATORY_CARE_PROVIDER_SITE_OTHER): Payer: Medicare HMO | Admitting: *Deleted

## 2017-03-31 DIAGNOSIS — I472 Ventricular tachycardia, unspecified: Secondary | ICD-10-CM

## 2017-04-01 NOTE — Progress Notes (Signed)
Remote ICD transmission.   

## 2017-04-02 ENCOUNTER — Telehealth: Payer: Self-pay | Admitting: *Deleted

## 2017-04-02 NOTE — Telephone Encounter (Signed)
-----   Message from [facility], [place] sent at 03/02/2017  6:16 AM EDT ----- Please let the patient know his testing looked OK, TS was barely above wnl, and we will keep an eye, no need for changes at this time. Repeat TSH in 2 months.  Thanks [employer]

## 2017-04-02 NOTE — Telephone Encounter (Signed)
LMOVM TO CALL BACK  

## 2017-04-05 ENCOUNTER — Telehealth: Payer: Self-pay | Admitting: Internal Medicine

## 2017-04-05 NOTE — Telephone Encounter (Signed)
New message      1. Has your device fired?  no  2. Is you device beeping?  no  3. Are you experiencing draining or swelling at device site?  no  4. Are you calling to see if we received your device transmission?  Yes   5. Have you passed out? no

## 2017-04-05 NOTE — Telephone Encounter (Signed)
Transmission received- he is aware transmission is sent automatically.

## 2017-04-06 ENCOUNTER — Encounter: Payer: Self-pay | Admitting: Cardiology

## 2017-04-13 LAB — CUP PACEART REMOTE DEVICE CHECK
Brady Statistic AS VS Percent: 0.15 %
Brady Statistic RV Percent Paced: 98.82 %
Date Time Interrogation Session: 20180905062608
HighPow Impedance: 513 Ohm
HighPow Impedance: 79 Ohm
Implantable Lead Implant Date: 20130717
Implantable Pulse Generator Implant Date: 20130717
Lead Channel Impedance Value: 513 Ohm
Lead Channel Impedance Value: 627 Ohm
Lead Channel Pacing Threshold Amplitude: 0.625 V
Lead Channel Pacing Threshold Amplitude: 1.375 V
Lead Channel Pacing Threshold Pulse Width: 0.4 ms
Lead Channel Sensing Intrinsic Amplitude: 1.875 mV
Lead Channel Sensing Intrinsic Amplitude: 5.75 mV
Lead Channel Setting Pacing Amplitude: 2 V
Lead Channel Setting Pacing Pulse Width: 0.4 ms
Lead Channel Setting Pacing Pulse Width: 0.4 ms
Lead Channel Setting Sensing Sensitivity: 0.3 mV
MDC IDC LEAD IMPLANT DT: 20130717
MDC IDC LEAD IMPLANT DT: 20130717
MDC IDC LEAD LOCATION: 753858
MDC IDC LEAD LOCATION: 753859
MDC IDC LEAD LOCATION: 753860
MDC IDC MSMT BATTERY VOLTAGE: 2.84 V
MDC IDC MSMT LEADCHNL LV IMPEDANCE VALUE: 513 Ohm
MDC IDC MSMT LEADCHNL LV IMPEDANCE VALUE: 760 Ohm
MDC IDC MSMT LEADCHNL LV PACING THRESHOLD PULSEWIDTH: 0.4 ms
MDC IDC MSMT LEADCHNL RA IMPEDANCE VALUE: 399 Ohm
MDC IDC MSMT LEADCHNL RA SENSING INTR AMPL: 1.875 mV
MDC IDC MSMT LEADCHNL RV PACING THRESHOLD AMPLITUDE: 0.625 V
MDC IDC MSMT LEADCHNL RV PACING THRESHOLD PULSEWIDTH: 0.4 ms
MDC IDC MSMT LEADCHNL RV SENSING INTR AMPL: 5.75 mV
MDC IDC SET LEADCHNL LV PACING AMPLITUDE: 2.5 V
MDC IDC SET LEADCHNL RV PACING AMPLITUDE: 2 V
MDC IDC STAT BRADY AP VP PERCENT: 78.74 %
MDC IDC STAT BRADY AP VS PERCENT: 0.14 %
MDC IDC STAT BRADY AS VP PERCENT: 20.98 %
MDC IDC STAT BRADY RA PERCENT PACED: 77.9 %

## 2017-05-24 DIAGNOSIS — R69 Illness, unspecified: Secondary | ICD-10-CM | POA: Diagnosis not present

## 2017-06-30 ENCOUNTER — Ambulatory Visit (INDEPENDENT_AMBULATORY_CARE_PROVIDER_SITE_OTHER): Payer: Medicare HMO | Admitting: *Deleted

## 2017-06-30 DIAGNOSIS — I472 Ventricular tachycardia, unspecified: Secondary | ICD-10-CM

## 2017-06-30 NOTE — Progress Notes (Signed)
Remote ICD transmission.   

## 2017-07-01 ENCOUNTER — Encounter: Payer: Self-pay | Admitting: Cardiology

## 2017-07-01 LAB — CUP PACEART REMOTE DEVICE CHECK
Battery Voltage: 2.78 V
Brady Statistic AS VP Percent: 15.22 %
Brady Statistic RA Percent Paced: 83.28 %
Date Time Interrogation Session: 20181205093628
HIGH POWER IMPEDANCE MEASURED VALUE: 80 Ohm
HighPow Impedance: 551 Ohm
Implantable Lead Implant Date: 20130717
Implantable Lead Implant Date: 20130717
Implantable Lead Location: 753858
Implantable Lead Location: 753860
Implantable Lead Model: 4396
Implantable Lead Model: 5076
Implantable Pulse Generator Implant Date: 20130717
Lead Channel Impedance Value: 399 Ohm
Lead Channel Impedance Value: 399 Ohm
Lead Channel Impedance Value: 722 Ohm
Lead Channel Pacing Threshold Amplitude: 0.625 V
Lead Channel Pacing Threshold Pulse Width: 0.4 ms
Lead Channel Pacing Threshold Pulse Width: 0.4 ms
Lead Channel Pacing Threshold Pulse Width: 0.4 ms
Lead Channel Sensing Intrinsic Amplitude: 1.125 mV
Lead Channel Sensing Intrinsic Amplitude: 1.125 mV
Lead Channel Sensing Intrinsic Amplitude: 5.25 mV
Lead Channel Setting Pacing Amplitude: 2 V
Lead Channel Setting Pacing Amplitude: 2.5 V
Lead Channel Setting Sensing Sensitivity: 0.3 mV
MDC IDC LEAD IMPLANT DT: 20130717
MDC IDC LEAD LOCATION: 753859
MDC IDC MSMT LEADCHNL LV IMPEDANCE VALUE: 494 Ohm
MDC IDC MSMT LEADCHNL LV PACING THRESHOLD AMPLITUDE: 1.5 V
MDC IDC MSMT LEADCHNL RA PACING THRESHOLD AMPLITUDE: 0.625 V
MDC IDC MSMT LEADCHNL RV IMPEDANCE VALUE: 646 Ohm
MDC IDC MSMT LEADCHNL RV SENSING INTR AMPL: 5.25 mV
MDC IDC SET LEADCHNL LV PACING PULSEWIDTH: 0.4 ms
MDC IDC SET LEADCHNL RV PACING AMPLITUDE: 2 V
MDC IDC SET LEADCHNL RV PACING PULSEWIDTH: 0.4 ms
MDC IDC STAT BRADY AP VP PERCENT: 84.56 %
MDC IDC STAT BRADY AP VS PERCENT: 0.13 %
MDC IDC STAT BRADY AS VS PERCENT: 0.08 %
MDC IDC STAT BRADY RV PERCENT PACED: 98.65 %

## 2017-07-14 ENCOUNTER — Ambulatory Visit: Payer: Medicare HMO | Admitting: Internal Medicine

## 2017-07-14 VITALS — BP 118/72 | HR 76 | Ht 65.0 in | Wt 218.0 lb

## 2017-07-14 DIAGNOSIS — Z79899 Other long term (current) drug therapy: Secondary | ICD-10-CM

## 2017-07-14 DIAGNOSIS — Z95810 Presence of automatic (implantable) cardiac defibrillator: Secondary | ICD-10-CM

## 2017-07-14 DIAGNOSIS — E782 Mixed hyperlipidemia: Secondary | ICD-10-CM | POA: Diagnosis not present

## 2017-07-14 DIAGNOSIS — I472 Ventricular tachycardia, unspecified: Secondary | ICD-10-CM

## 2017-07-14 DIAGNOSIS — I5022 Chronic systolic (congestive) heart failure: Secondary | ICD-10-CM

## 2017-07-14 LAB — CUP PACEART INCLINIC DEVICE CHECK
Brady Statistic AP VP Percent: 81.5 %
Brady Statistic AS VP Percent: 18.25 %
Brady Statistic AS VS Percent: 0.13 %
Brady Statistic RV Percent Paced: 98.73 %
HighPow Impedance: 475 Ohm
HighPow Impedance: 72 Ohm
Implantable Lead Implant Date: 20130717
Implantable Lead Implant Date: 20130717
Implantable Lead Location: 753858
Implantable Lead Location: 753860
Implantable Lead Model: 4396
Implantable Lead Model: 5076
Implantable Lead Model: 6935
Lead Channel Impedance Value: 399 Ohm
Lead Channel Impedance Value: 418 Ohm
Lead Channel Pacing Threshold Amplitude: 0.625 V
Lead Channel Pacing Threshold Pulse Width: 0.4 ms
Lead Channel Pacing Threshold Pulse Width: 0.4 ms
Lead Channel Sensing Intrinsic Amplitude: 2.375 mV
Lead Channel Sensing Intrinsic Amplitude: 5.75 mV
Lead Channel Setting Pacing Amplitude: 2 V
Lead Channel Setting Pacing Amplitude: 2.5 V
Lead Channel Setting Pacing Pulse Width: 0.4 ms
Lead Channel Setting Sensing Sensitivity: 0.3 mV
MDC IDC LEAD IMPLANT DT: 20130717
MDC IDC LEAD LOCATION: 753859
MDC IDC MSMT BATTERY VOLTAGE: 2.77 V
MDC IDC MSMT LEADCHNL LV IMPEDANCE VALUE: 513 Ohm
MDC IDC MSMT LEADCHNL LV IMPEDANCE VALUE: 855 Ohm
MDC IDC MSMT LEADCHNL LV PACING THRESHOLD AMPLITUDE: 1.5 V
MDC IDC MSMT LEADCHNL LV PACING THRESHOLD PULSEWIDTH: 0.4 ms
MDC IDC MSMT LEADCHNL RA SENSING INTR AMPL: 1.75 mV
MDC IDC MSMT LEADCHNL RV IMPEDANCE VALUE: 589 Ohm
MDC IDC MSMT LEADCHNL RV PACING THRESHOLD AMPLITUDE: 0.75 V
MDC IDC MSMT LEADCHNL RV SENSING INTR AMPL: 5.5 mV
MDC IDC PG IMPLANT DT: 20130717
MDC IDC SESS DTM: 20181219105718
MDC IDC SET LEADCHNL RV PACING AMPLITUDE: 2 V
MDC IDC SET LEADCHNL RV PACING PULSEWIDTH: 0.4 ms
MDC IDC STAT BRADY AP VS PERCENT: 0.13 %
MDC IDC STAT BRADY RA PERCENT PACED: 80.46 %

## 2017-07-14 LAB — COMPREHENSIVE METABOLIC PANEL
A/G RATIO: 2.2 (ref 1.2–2.2)
ALT: 33 IU/L (ref 0–44)
AST: 23 IU/L (ref 0–40)
Albumin: 4.4 g/dL (ref 3.5–4.8)
Alkaline Phosphatase: 47 IU/L (ref 39–117)
BILIRUBIN TOTAL: 0.5 mg/dL (ref 0.0–1.2)
BUN / CREAT RATIO: 12 (ref 10–24)
BUN: 13 mg/dL (ref 8–27)
CHLORIDE: 102 mmol/L (ref 96–106)
CO2: 23 mmol/L (ref 20–29)
Calcium: 9.3 mg/dL (ref 8.6–10.2)
Creatinine, Ser: 1.1 mg/dL (ref 0.76–1.27)
GFR calc non Af Amer: 67 mL/min/{1.73_m2} (ref >59)
GFR, EST AFRICAN AMERICAN: 78 mL/min/{1.73_m2} (ref >59)
GLOBULIN, TOTAL: 2 g/dL (ref 1.5–4.5)
Glucose: 114 mg/dL — ABNORMAL HIGH (ref 65–99)
POTASSIUM: 4.8 mmol/L (ref 3.5–5.2)
SODIUM: 140 mmol/L (ref 134–144)
Total Protein: 6.4 g/dL (ref 6.0–8.5)

## 2017-07-14 LAB — LIPID PANEL
CHOL/HDL RATIO: 3.9 ratio (ref 0.0–5.0)
Cholesterol, Total: 132 mg/dL (ref 100–199)
HDL: 34 mg/dL — ABNORMAL LOW (ref >39)
LDL CALC: 80 mg/dL (ref 0–99)
TRIGLYCERIDES: 91 mg/dL (ref 0–149)
VLDL Cholesterol Cal: 18 mg/dL (ref 5–40)

## 2017-07-14 LAB — TSH: TSH: 2.91 u[IU]/mL (ref 0.450–4.500)

## 2017-07-14 NOTE — Progress Notes (Signed)
Was      Patient Care Team: Madelyn Brunner, MD as PCP - General (Internal Medicine)   HPI  Jerry Farley is a 71 y.o. male Seen in followup for ICD implanted to remotely for which he underwent device generator replacement 7/13. Indication was polymorphic ventricular tachycardia for which he takes amiodarone. This occurrs in the context of nonischemic cardiomyopathy. He also has a history of a 6949-lead which was revised the time of generator replacement   He also underwent CRT upgrade of atrial lead and an LV lead.   He also has coronary artery disease with prior stenting of his circumflex.  Echocardiogram 7/14 EF 40-45%--LAE (40/1.9) Myoview 2011 demonstrated a large inferoapical scar and without ischemia. Ejection fraction at that time was 29%  DATE TEST    2011 myoview   EF 29 % Inferoapical scar  7/14 Echo    EF 40-45% % LAE mild  12/16 Echo  Ef 40-45%       Date TSH LFTs PFTs  11/16  0.48 28    6/17  1.6 30    8/18 4.96 (on synthroid) 30    Some dyspnea on exertion but denies chest pain or edema. He is not altogether fiT   Patient denies symptoms of GI intolerance, sun sensitivity, neurological symptoms attributable to amiodarone.        Past Medical History:  Diagnosis Date  . 6948-lead   . Arthritis    "maybe a little"  . CAD (coronary artery disease) July 2007   CFX stent (patent Sept '07)  . Chronic systolic congestive heart failure ([place])   . Complication of anesthesia    "problems waking up w/valium"  . High cholesterol   . Hypothyroidism   . ICD (implantable cardiac defibrillator) in place 2006, 2013   Medtronic Protecta XT-DR CRT (01/2012)  . NICM (nonischemic cardiomyopathy) ([place])    ejection fraction 40-45% by echo July 2012  . Ventricular tachycardia, non-sustained ([place]) 2007   Amiodarone    Past Surgical History:  Procedure Laterality Date  . BI-VENTRICULAR IMPLANTABLE CARDIOVERTER DEFIBRILLATOR UPGRADE N/A 02/10/2012   Procedure:  BI-VENTRICULAR IMPLANTABLE CARDIOVERTER DEFIBRILLATOR UPGRADE;  Surgeon: Deboraha Sprang, MD;  Location: [facility] CATH LAB;  Service: Cardiovascular;  Laterality: N/A;  . CARDIAC CATHETERIZATION  11/30/2001   non-ischemic cardiomyopahty with high LV end-diastolic pressure; 50% distal L main stenosis, large osital stenosis 70%, 70% Cfx stenosis (Dr. Domenic Moras)  . CARDIAC CATHETERIZATION  9/07   CFX stent patent [employer])  . CARDIAC DEFIBRILLATOR PLACEMENT  09/2004; 02/10/12   ICD implanted in 2006 (Dr. Rito Ehrlich); EOL generator change in 01/2012 - CRT (BiV ICD) (Dr. Caryl Comes)  . CORONARY ANGIOPLASTY WITH STENT PLACEMENT   01/2006   CFX   . NM MYOCAR PERF EJECTION FRACTION  05/2010   dipyridamole myoview; mod perfusion defect in apical, basal inferior, mid inferior & apical inferior regions (infarct/scar); post-stress 28%, low risk scan  . TONSILLECTOMY AND ADENOIDECTOMY     "when I was a child"  . TRANSTHORACIC ECHOCARDIOGRAM  01/2013   EF 40-45%, LV mildly dilated, mild LVH, mod hypokinesis of inferior myocardium, grade 1 diastolic dysfunction; mild MR    Current Outpatient Medications  Medication Sig Dispense Refill  . amiodarone (PACERONE) 200 MG tablet Take 1 tablet (200 mg total) by mouth daily. 30 tablet 6  . aspirin 81 MG tablet Take 81 mg by mouth daily.      Marland Kitchen atorvastatin (LIPITOR) 40 MG tablet TAKE ONE TABLET BY MOUTH  ONCE DAILY **INCREASED  DOSAGE** 90 tablet 3  . carvedilol (COREG) 25 MG tablet TAKE ONE TABLET BY MOUTH TWICE DAILY WITH MEALS 180 tablet 3  . clopidogrel (PLAVIX) 75 MG tablet TAKE ONE TABLET BY MOUTH ONCE DAILY 90 tablet 3  . fish oil-omega-3 fatty acids 1000 MG capsule Take 1 g by mouth daily.     Marland Kitchen levothyroxine (SYNTHROID, LEVOTHROID) 75 MCG tablet TAKE ONE TABLET BY MOUTH DAILY BEFORE BREAKFAST. NEED OFFICE VISIT. 30 tablet 0  . Magnesium Oxide 400 MG CAPS Take 1 capsule by mouth daily.     . Multiple Vitamin (MULTIVITAMIN) tablet Take 1 tablet by mouth daily.      . ramipril  (ALTACE) 10 MG capsule TAKE ONE CAPSULE BY MOUTH ONCE DAILY 90 capsule 3  . spironolactone (ALDACTONE) 25 MG tablet TAKE ONE-HALF TABLET BY MOUTH DAILY 15 tablet 8  . you have a pacemaker book MISC 1 each by Does not apply route once. 1 each 0   No current facility-administered medications for this visit.     No Known Allergies  Review of Systems negative except from HPI and PMH  Physical Exam BP 118/72   Pulse 76   Ht 5\' 5"  (1.651 m)   Wt 218 lb (98.9 kg)   BMI 36.28 kg/m  Well developed and well nourished in no acute distress HENT normal E scleral and icterus clear Neck Supple JVP flat; carotids brisk and full Clear to ausculation Device pocket well healed; without hematoma or erythema.  There is no tethering  Regular rate and rhythm, no murmurs gallops or rub Soft with active bowel sounds No clubbing cyanosis uterine tingling Edema Alert and oriented, grossly normal motor and sensory function Skin Warm and Dry  ECG today demonstrates sinus rhythm with P-synchronous/ AV  pacing  QRS is surprisingly broad at 185 ms  With upright QRS V1  Assessment and  Plan  Polymorphic ventricular tachycardia-- intercurrent nonsustained episodes  Nonischemic cardiomyopathy  Coronary artery disease with prior stenting  Amiodarone  Dyslipidemia  Heart failure-chronic-mixed  CRT-[employer]  Hypothyroidism treated    No intercurrent Ventricular tachycardia  Will continue current dose of amiodarone.  We will try in the future to reduce the dose again.  We will check amiodarone surveillance laboratories today.  Last TSH was borderline elevated.  We will recheck  We will check his lipids todaylast LDL was not at goal.  Euvolemic continue current meds  Without symptoms of ischemia

## 2017-07-14 NOTE — Patient Instructions (Addendum)
Medication Instructions: Your physician recommends that you continue on your current medications as directed. Please refer to the Current Medication list given to you today.  Labwork: Your physician has recommended that you have lab work today: CMET, Fasting Lipid, and TSH   Procedures/Testing: None Ordered  Follow-Up: Your physician wants you to follow-up in: 6 MONTHS with Tommye Standard, PA-C. You will receive a reminder letter in the mail two months in advance. If you don't receive a letter, please call our office to schedule the follow-up appointment.  Remote monitoring is used to monitor your ICD from home. This monitoring reduces the number of office visits required to check your device to one time per year. It allows [place] to keep an eye on the functioning of your device to ensure it is working properly. You are scheduled for a device check from home on 09/29/16. You may send your transmission at any time that day. If you have a wireless device, the transmission will be sent automatically. After your physician reviews your transmission, you will receive a postcard with your next transmission date.   If you need a refill on your cardiac medications before your next appointment, please call your pharmacy.

## 2017-07-15 ENCOUNTER — Telehealth: Payer: Self-pay

## 2017-07-15 MED ORDER — ROSUVASTATIN CALCIUM 20 MG PO TABS: 20.0000 mg | ORAL_TABLET | Freq: Every day | ORAL | 3 refills | Status: DC

## 2017-07-15 NOTE — Telephone Encounter (Signed)
Pt is aware and agreeable to LDL not being at goal and switching form atorvastatin to rosuvastatin 20 mg. I have called in RX and pt is agreeable to starting tomorrow. I will fax copy of results to PCP for maintenance.

## 2017-07-15 NOTE — Telephone Encounter (Signed)
lmtcb for lab results

## 2017-07-15 NOTE — Telephone Encounter (Signed)
Mr. Buenger is returning a call about his lab results . Thanks

## 2017-08-09 DIAGNOSIS — Z0001 Encounter for general adult medical examination with abnormal findings: Secondary | ICD-10-CM | POA: Diagnosis not present

## 2017-08-09 DIAGNOSIS — I2510 Atherosclerotic heart disease of native coronary artery without angina pectoris: Secondary | ICD-10-CM | POA: Diagnosis not present

## 2017-08-09 DIAGNOSIS — Z0000 Encounter for general adult medical examination without abnormal findings: Secondary | ICD-10-CM | POA: Diagnosis not present

## 2017-08-09 DIAGNOSIS — E034 Atrophy of thyroid (acquired): Secondary | ICD-10-CM | POA: Diagnosis not present

## 2017-08-09 DIAGNOSIS — E7800 Pure hypercholesterolemia, unspecified: Secondary | ICD-10-CM | POA: Diagnosis not present

## 2017-08-25 ENCOUNTER — Other Ambulatory Visit: Payer: Self-pay | Admitting: Internal Medicine

## 2017-08-25 ENCOUNTER — Other Ambulatory Visit: Payer: Self-pay | Admitting: Nurse Practitioner

## 2017-09-29 ENCOUNTER — Ambulatory Visit (INDEPENDENT_AMBULATORY_CARE_PROVIDER_SITE_OTHER): Payer: Medicare HMO | Admitting: *Deleted

## 2017-09-29 DIAGNOSIS — I472 Ventricular tachycardia, unspecified: Secondary | ICD-10-CM

## 2017-09-29 NOTE — Progress Notes (Signed)
Remote ICD transmission.   

## 2017-09-30 ENCOUNTER — Encounter: Payer: Self-pay | Admitting: Cardiology

## 2017-10-04 ENCOUNTER — Telehealth: Payer: Self-pay

## 2017-10-04 ENCOUNTER — Telehealth: Payer: Self-pay | Admitting: *Deleted

## 2017-10-04 LAB — CUP PACEART REMOTE DEVICE CHECK
Battery Voltage: 2.67 V
Brady Statistic AP VS Percent: 0.13 %
Brady Statistic AS VP Percent: 7.87 %
Brady Statistic AS VS Percent: 0.06 %
Brady Statistic RA Percent Paced: 90.89 %
Brady Statistic RV Percent Paced: 98.89 %
HighPow Impedance: 494 Ohm
HighPow Impedance: 78 Ohm
Implantable Lead Implant Date: 20130717
Implantable Lead Implant Date: 20130717
Implantable Lead Location: 753858
Implantable Lead Location: 753859
Lead Channel Impedance Value: 399 Ohm
Lead Channel Pacing Threshold Amplitude: 0.875 V
Lead Channel Pacing Threshold Pulse Width: 0.4 ms
Lead Channel Pacing Threshold Pulse Width: 0.4 ms
Lead Channel Sensing Intrinsic Amplitude: 0.875 mV
Lead Channel Sensing Intrinsic Amplitude: 5.75 mV
Lead Channel Setting Pacing Amplitude: 2 V
Lead Channel Setting Pacing Amplitude: 2.5 V
Lead Channel Setting Pacing Pulse Width: 0.4 ms
MDC IDC LEAD IMPLANT DT: 20130717
MDC IDC LEAD LOCATION: 753860
MDC IDC MSMT LEADCHNL LV IMPEDANCE VALUE: 494 Ohm
MDC IDC MSMT LEADCHNL LV IMPEDANCE VALUE: 494 Ohm
MDC IDC MSMT LEADCHNL LV IMPEDANCE VALUE: 760 Ohm
MDC IDC MSMT LEADCHNL LV PACING THRESHOLD AMPLITUDE: 1.5 V
MDC IDC MSMT LEADCHNL LV PACING THRESHOLD PULSEWIDTH: 0.4 ms
MDC IDC MSMT LEADCHNL RA PACING THRESHOLD AMPLITUDE: 0.625 V
MDC IDC MSMT LEADCHNL RA SENSING INTR AMPL: 0.875 mV
MDC IDC MSMT LEADCHNL RV IMPEDANCE VALUE: 627 Ohm
MDC IDC MSMT LEADCHNL RV SENSING INTR AMPL: 5.75 mV
MDC IDC PG IMPLANT DT: 20130717
MDC IDC SESS DTM: 20190306072828
MDC IDC SET LEADCHNL RV PACING AMPLITUDE: 2 V
MDC IDC SET LEADCHNL RV PACING PULSEWIDTH: 0.4 ms
MDC IDC SET LEADCHNL RV SENSING SENSITIVITY: 0.3 mV
MDC IDC STAT BRADY AP VP PERCENT: 91.94 %

## 2017-10-04 NOTE — Telephone Encounter (Signed)
LVM for pt to call device clinic back regarding ATP episodes from 2/4, 2/3, 1/29, 1/28 that transmitted on remote from 09/29/2017.

## 2017-10-04 NOTE — Telephone Encounter (Signed)
Pt returning call from Theodoro Doing, RN today. He was asymptomatic with all episodes, no missed meds, aware of driving restrictions x 6 months. I let him know we will review these episodes with Dr. Caryl Comes and call back with any recommendations. He verbalizes understanding.

## 2017-10-13 ENCOUNTER — Telehealth: Payer: Self-pay

## 2017-10-13 NOTE — Telephone Encounter (Signed)
Discussed with patient Dr Olin Pia recommendation of starting Ranolazine 500mg  bid versus Mexilitine 200mg  bid. I advise Mr Boylen to call his insurance company to get pricing on both of these medications. He is to call me back and Dr Caryl Comes will make a decision based on the pt's findings.

## 2017-10-13 NOTE — Telephone Encounter (Signed)
-----   Message from Deboraha Sprang, MD sent at 10/05/2017 11:01 AM EDT ----- Remote reviewed. This remote is abnormal for intercurrent VT plz see if we can price ranolazine for him-- we would like to control the VT even if not bothering him -- so at least to be able to allow him to drive

## 2017-10-14 ENCOUNTER — Telehealth: Payer: Self-pay | Admitting: Internal Medicine

## 2017-10-14 MED ORDER — SPIRONOLACTONE 25 MG PO TABS: 12.5000 mg | ORAL_TABLET | Freq: Every day | ORAL | 3 refills | Status: DC

## 2017-10-14 MED ORDER — RANOLAZINE ER 500 MG PO TB12: 500.0000 mg | ORAL_TABLET | Freq: Two times a day (BID) | ORAL | 3 refills | Status: DC

## 2017-10-14 NOTE — Telephone Encounter (Signed)
Spoke with pt and reviewed medication instructions and potential side effects (per [employer]ix) of Ranolazine. I also told him he should check with his pharmacist for any additional side effects.  Advised patient Dr Caryl Comes would like a follow up with him 3 months from now to review the medication effectiveness with him. Pt verbalized understanding and had no additional questions.

## 2017-10-14 NOTE — Telephone Encounter (Signed)
New Message:   Pt is returning call about medicine discussed on  Yesterday. Pt will be home for the next 1hr and half.

## 2017-12-29 ENCOUNTER — Ambulatory Visit (INDEPENDENT_AMBULATORY_CARE_PROVIDER_SITE_OTHER): Payer: Medicare HMO | Admitting: *Deleted

## 2017-12-29 DIAGNOSIS — I472 Ventricular tachycardia, unspecified: Secondary | ICD-10-CM

## 2017-12-29 DIAGNOSIS — I5022 Chronic systolic (congestive) heart failure: Secondary | ICD-10-CM

## 2017-12-29 NOTE — Progress Notes (Signed)
Remote ICD transmission.   

## 2018-01-19 ENCOUNTER — Ambulatory Visit: Payer: Medicare HMO | Admitting: Internal Medicine

## 2018-01-19 VITALS — BP 112/78 | HR 97 | Ht 66.0 in | Wt 207.0 lb

## 2018-01-19 DIAGNOSIS — I5022 Chronic systolic (congestive) heart failure: Secondary | ICD-10-CM

## 2018-01-19 DIAGNOSIS — I472 Ventricular tachycardia, unspecified: Secondary | ICD-10-CM

## 2018-01-19 DIAGNOSIS — Z95810 Presence of automatic (implantable) cardiac defibrillator: Secondary | ICD-10-CM

## 2018-01-19 DIAGNOSIS — I4729 Other ventricular tachycardia: Secondary | ICD-10-CM

## 2018-01-19 LAB — BASIC METABOLIC PANEL
BUN / CREAT RATIO: 12 (ref 10–24)
BUN: 13 mg/dL (ref 8–27)
CALCIUM: 9.6 mg/dL (ref 8.6–10.2)
CO2: 24 mmol/L (ref 20–29)
Chloride: 104 mmol/L (ref 96–106)
Creatinine, Ser: 1.09 mg/dL (ref 0.76–1.27)
GFR calc Af Amer: 78 mL/min/{1.73_m2} (ref >59)
GFR, EST NON AFRICAN AMERICAN: 67 mL/min/{1.73_m2} (ref >59)
Glucose: 120 mg/dL — ABNORMAL HIGH (ref 65–99)
POTASSIUM: 4.8 mmol/L (ref 3.5–5.2)
SODIUM: 141 mmol/L (ref 134–144)

## 2018-01-19 LAB — CUP PACEART INCLINIC DEVICE CHECK
Brady Statistic AP VP Percent: 89.52 %
Brady Statistic AS VP Percent: 10.04 %
Brady Statistic RA Percent Paced: 88.64 %
Brady Statistic RV Percent Paced: 98.61 %
HighPow Impedance: 513 Ohm
HighPow Impedance: 77 Ohm
Implantable Lead Implant Date: 20130717
Implantable Lead Implant Date: 20130717
Implantable Lead Implant Date: 20130717
Implantable Lead Location: 753858
Implantable Lead Model: 4396
Implantable Lead Model: 6935
Implantable Pulse Generator Implant Date: 20130717
Lead Channel Impedance Value: 399 Ohm
Lead Channel Impedance Value: 931 Ohm
Lead Channel Pacing Threshold Amplitude: 1.25 V
Lead Channel Pacing Threshold Pulse Width: 0.4 ms
Lead Channel Sensing Intrinsic Amplitude: 2.625 mV
MDC IDC LEAD LOCATION: 753859
MDC IDC LEAD LOCATION: 753860
MDC IDC MSMT BATTERY VOLTAGE: 2.64 V
MDC IDC MSMT LEADCHNL LV IMPEDANCE VALUE: 513 Ohm
MDC IDC MSMT LEADCHNL LV IMPEDANCE VALUE: 589 Ohm
MDC IDC MSMT LEADCHNL LV PACING THRESHOLD PULSEWIDTH: 0.4 ms
MDC IDC MSMT LEADCHNL RA PACING THRESHOLD AMPLITUDE: 0.75 V
MDC IDC MSMT LEADCHNL RV IMPEDANCE VALUE: 589 Ohm
MDC IDC MSMT LEADCHNL RV PACING THRESHOLD AMPLITUDE: 0.75 V
MDC IDC MSMT LEADCHNL RV PACING THRESHOLD PULSEWIDTH: 0.4 ms
MDC IDC MSMT LEADCHNL RV SENSING INTR AMPL: 5.25 mV
MDC IDC SESS DTM: 20190626130215
MDC IDC SET LEADCHNL LV PACING AMPLITUDE: 2.5 V
MDC IDC SET LEADCHNL LV PACING PULSEWIDTH: 0.4 ms
MDC IDC SET LEADCHNL RA PACING AMPLITUDE: 2 V
MDC IDC SET LEADCHNL RV PACING AMPLITUDE: 2 V
MDC IDC SET LEADCHNL RV PACING PULSEWIDTH: 0.4 ms
MDC IDC SET LEADCHNL RV SENSING SENSITIVITY: 0.3 mV
MDC IDC STAT BRADY AP VS PERCENT: 0.32 %
MDC IDC STAT BRADY AS VS PERCENT: 0.12 %

## 2018-01-19 LAB — CBC WITH DIFFERENTIAL/PLATELET
BASOS: 0 %
Basophils Absolute: 0 10*3/uL (ref 0.0–0.2)
EOS (ABSOLUTE): 0.2 10*3/uL (ref 0.0–0.4)
EOS: 2 %
HEMATOCRIT: 39.7 % (ref 37.5–51.0)
Hemoglobin: 13.6 g/dL (ref 13.0–17.7)
IMMATURE GRANS (ABS): 0 10*3/uL (ref 0.0–0.1)
IMMATURE GRANULOCYTES: 0 %
LYMPHS: 26 %
Lymphocytes Absolute: 1.8 10*3/uL (ref 0.7–3.1)
MCH: 32.2 pg (ref 26.6–33.0)
MCHC: 34.3 g/dL (ref 31.5–35.7)
MCV: 94 fL (ref 79–97)
Monocytes Absolute: 0.7 10*3/uL (ref 0.1–0.9)
Monocytes: 10 %
NEUTROS PCT: 62 %
Neutrophils Absolute: 4.3 10*3/uL (ref 1.4–7.0)
Platelets: 202 10*3/uL (ref 150–450)
RBC: 4.23 x10E6/uL (ref 4.14–5.80)
RDW: 13.7 % (ref 12.3–15.4)
WBC: 7 10*3/uL (ref 3.4–10.8)

## 2018-01-19 LAB — HEPATIC FUNCTION PANEL
ALT: 32 IU/L (ref 0–44)
AST: 21 IU/L (ref 0–40)
Albumin: 4.4 g/dL (ref 3.5–4.8)
Alkaline Phosphatase: 40 IU/L (ref 39–117)
BILIRUBIN TOTAL: 0.4 mg/dL (ref 0.0–1.2)
BILIRUBIN, DIRECT: 0.13 mg/dL (ref 0.00–0.40)
Total Protein: 6.6 g/dL (ref 6.0–8.5)

## 2018-01-19 LAB — TSH: TSH: 3.06 u[IU]/mL (ref 0.450–4.500)

## 2018-01-19 MED ORDER — SACUBITRIL-VALSARTAN 24-26 MG PO TABS: 1.0000 | ORAL_TABLET | Freq: Two times a day (BID) | ORAL | 0 refills | Status: DC

## 2018-01-19 MED ORDER — LEVOTHYROXINE SODIUM 75 MCG PO TABS: 75.0000 ug | ORAL_TABLET | Freq: Every day | ORAL | 3 refills | Status: DC

## 2018-01-19 NOTE — Progress Notes (Signed)
Was      Patient Care Team: Madelyn Brunner, MD as PCP - General (Internal Medicine)   HPI  Jerry Farley is a 72 y.o. male Seen in followup for ICD implanted to remotely for which he underwent device generator replacement 7/13. Indication was polymorphic ventricular tachycardia for which he takes amiodarone. This occurrs in the context of nonischemic cardiomyopathy. He also has a history of a 6949-lead which was revised the time of generator replacement   He also underwent CRT upgrade of atrial lead and an LV lead.   He also has coronary artery disease with prior stenting of his circumflex.  He is continued to have problems with ventricular tachycardia is currently on a combination of amiodarone and ranolazine  DATE TEST    2011 myoview   EF 29 % Inferoapical scar  7/14 Echo    EF 40-45% % LAE mild  12/16 Echo  Ef 40-45%       Date Cr K TSH LFTs PFTs  11/16    0.48 28    6/17    1.6 30    8/18   4.96 (on synthroid) 30   12/18 1.1 4.8 2.19 33      No shortness of breath chest pain or edema  Cough that is chronic.  No sun sensitivity nausea.  .        Past Medical History:  Diagnosis Date  . 6948-lead   . Arthritis    "maybe a little"  . CAD (coronary artery disease) July 2007   CFX stent (patent Sept '07)  . Cardiac defibrillator CRT-medtronic 01/26/2011  . Chronic systolic congestive heart failure ([place])   . Complication of anesthesia    "problems waking up w/valium"  . High cholesterol   . Hypothyroidism   . ICD (implantable cardiac defibrillator) in place 2006, 2013   Medtronic Protecta XT-DR CRT (01/2012)  . NICM (nonischemic cardiomyopathy) ([place])    ejection fraction 40-45% by echo July 2012  . Non-ischemic cardiomyopathy- EF 40-45% 2D 7/14 02/11/2012  . Obesity (BMI 30-39.9) 08/22/2013  . Ventricular tachycardia, non-sustained ([place]) 2007   Amiodarone    Past Surgical History:  Procedure Laterality Date  . BI-VENTRICULAR IMPLANTABLE CARDIOVERTER  DEFIBRILLATOR UPGRADE N/A 02/10/2012   Procedure: BI-VENTRICULAR IMPLANTABLE CARDIOVERTER DEFIBRILLATOR UPGRADE;  Surgeon: Deboraha Sprang, MD;  Location[facility] C[facility] LAB;  Service: Cardiovascular;  Laterality: N/A;  . CARDIAC CATHETERIZATION  11/30/2001   non-ischemic cardiomyopahty with high LV end-diastolic pressure; 76% distal L main stenosis, large osital stenosis 70%, 70% Cfx stenosis (Dr. Domenic Moras)  . CARDIAC CATHETERIZATION  9/07   CFX stent patent [employer])  . CARDIAC DEFIBRILLATOR PLACEMENT  09/2004; 02/10/12   ICD implanted in 2006 (Dr. Rito Ehrlich); EOL generator change in 01/2012 - CRT (BiV ICD) (Dr. Caryl Comes)  . CORONARY ANGIOPLASTY WITH STENT PLACEMENT   01/2006   CFX   . NM MYOCAR PERF EJECTION FRACTION  05/2010   dipyridamole myoview; mod perfusion defect in apical, basal inferior, mid inferior & apical inferior regions (infarct/scar); post-stress 28%, low risk scan  . TONSILLECTOMY AND ADENOIDECTOMY     "when I was a child"  . TRANSTHORACIC ECHOCARDIOGRAM  01/2013   EF 40-45%, LV mildly dilated, mild LVH, mod hypokinesis of inferior myocardium, grade 1 diastolic dysfunction; mild MR    Current Outpatient Medications  Medication Sig Dispense Refill  . amiodarone (PACERONE) 200 MG tablet TAKE 1 TABLET BY MOUTH  DAILY 90 tablet 3  . aspirin 81 MG tablet  Take 81 mg by mouth daily.      . carvedilol (COREG) 25 MG tablet TAKE 1 TABLET BY MOUTH TWICE DAILY WITH MEALS 180 tablet 3  . clopidogrel (PLAVIX) 75 MG tablet TAKE 1 TABLET BY MOUTH ONCE DAILY 90 tablet 3  . fish oil-omega-3 fatty acids 1000 MG capsule Take 1 g by mouth daily.     . Magnesium Oxide 400 MG CAPS Take 1 capsule by mouth daily.     . Multiple Vitamin (MULTIVITAMIN) tablet Take 1 tablet by mouth daily.      . ramipril (ALTACE) 10 MG capsule TAKE 1 CAPSULE BY MOUTH ONCE DAILY 90 capsule 3  . ranolazine (RANEXA) 500 MG 12 hr tablet Take 1 tablet (500 mg total) by mouth 2 (two) times daily. 180 tablet 3  . spironolactone  (ALDACTONE) 25 MG tablet Take 0.5 tablets (12.5 mg total) by mouth daily. 45 tablet 3  . you have a pacemaker book MISC 1 each by Does not apply route once. 1 each 0  . levothyroxine (SYNTHROID, LEVOTHROID) 75 MCG tablet Take 1 tablet (75 mcg total) by mouth daily before breakfast. 90 tablet 3  . rosuvastatin (CRESTOR) 20 MG tablet Take 1 tablet (20 mg total) by mouth daily. 90 tablet 3   No current facility-administered medications for this visit.     No Known Allergies  Review of Systems negative except from HPI and PMH  Physical Exam BP 112/78   Pulse 97   Ht 5\' 6"  (1.676 m)   Wt 207 lb (93.9 kg)   SpO2 97%   BMI 33.41 kg/m  Well developed and nourished in no acute distress HENT normal Neck supple with JVP-flat Clear Regular rate and rhythm, no murmurs or gallops Abd-soft with active BS No Clubbing cyanosis edema Skin-warm and dry A & Oriented  Grossly normal sensory and motor function   ECG today demonstrates sinus rhythm with P synchronous pacing with a negative QRS lead I upright QRS lead V1  Assessment and  Plan  Polymorphic ventricular tachycardia-- intercurrent nonsustained episodes  Nonischemic cardiomyopathy  Ventricular tachycardia-monomorphic-paced terminated  Coronary artery disease with prior stenting  Amiodarone  Dyslipidemia  Heart failure-chronic-mixed  CRT-[employer]  Hypothyroidism treated    Intercurrent pace terminated VT    Given his cardiomyopathy, we will switch him from Altace--Entresto.  This is also been shown to be beneficial in decreasing ventricular tachycardia burden.  In the event that we are successful with this, we could consider backing off on his ranolazine.  Euvolemic continue current meds  Tolerating amiodarone.  We will check his surveillance laboratories today.  Without symptoms of ischemia  Check TSH  Continue rosuvastatin

## 2018-01-19 NOTE — Patient Instructions (Signed)
Medication Instructions:  Your physician has recommended you make the following change in your medication:   1. Stop Altace 2. Starting Friday evening, begin Entresto (24/26mg ), take one tablet.     On Saturday, begin taking Entresto, one tablet in the morning, one tablet in the evening.   Labwork: You will have labs drawn today: CBC, BMP, LFTs, TSH  Testing/Procedures: None ordered.  Follow-Up:  Follow up with the pharmacy, Hypertension clinic, in 2 weeks for Entresto blood work and blood pressure check. They may titrate your dose up according to your lab work and blood pressure.  Your physician recommends that you schedule a follow-up appointment in 3 months with Dr Caryl Comes  Remote monitoring is used to monitor your ICD from home. This monitoring reduces the number of office visits required to check your device to one time per year. It allows [place] to keep an eye on the functioning of your device to ensure it is working properly. You are scheduled for a device check from home on 03/30/2018. You may send your transmission at any time that day. If you have a wireless device, the transmission will be sent automatically. After your physician reviews your transmission, you will receive a postcard with your next transmission date.    Any Other Special Instructions Will Be Listed Below (If Applicable).     If you need a refill on your cardiac medications before your next appointment, please call your pharmacy.

## 2018-02-10 ENCOUNTER — Ambulatory Visit (INDEPENDENT_AMBULATORY_CARE_PROVIDER_SITE_OTHER): Payer: Medicare HMO | Admitting: Pharmacist

## 2018-02-10 ENCOUNTER — Encounter: Payer: Self-pay | Admitting: Pharmacist

## 2018-02-10 VITALS — BP 122/74 | HR 78

## 2018-02-10 DIAGNOSIS — I5022 Chronic systolic (congestive) heart failure: Secondary | ICD-10-CM

## 2018-02-10 LAB — BASIC METABOLIC PANEL
BUN/Creatinine Ratio: 12 (ref 10–24)
BUN: 15 mg/dL (ref 8–27)
CALCIUM: 9.3 mg/dL (ref 8.6–10.2)
CHLORIDE: 103 mmol/L (ref 96–106)
CO2: 23 mmol/L (ref 20–29)
Creatinine, Ser: 1.3 mg/dL — ABNORMAL HIGH (ref 0.76–1.27)
GFR calc Af Amer: 63 mL/min/{1.73_m2} (ref >59)
GFR calc non Af Amer: 55 mL/min/{1.73_m2} — ABNORMAL LOW (ref >59)
GLUCOSE: 107 mg/dL — AB (ref 65–99)
Potassium: 4.4 mmol/L (ref 3.5–5.2)
Sodium: 141 mmol/L (ref 134–144)

## 2018-02-10 NOTE — Patient Instructions (Signed)
Blood work today - BMET   RESTART Entresto 24/26mg  TWICE DAILY  CONTINUE all other medications as prescribed  Follow up in clinic in 2-3 weeks.

## 2018-02-10 NOTE — Progress Notes (Signed)
Patient ID: Jerry Farley                 DOB: 1945/10/18                      MRN: [id number]     HPI: AKIF WELDY is a 72 y.o. male patient of Dr. Caryl Comes who presents today for hypertension evaluation. PMH significant for CAD, NICM w/ICD, chronic CHF (systolic), HLD. He was recently started on Entresto.   He presents today for medication titration. He asks if Delene Loll is associated with flank pain. He reports that he stopped taking the medication for a day or two right after he started due to this pain, but restarted since then. He reports that the pain is slightly better now. He stopped Entresto last Friday as he ran out of medication.   Current HTN meds:  Carvedilol 25mg  BID Entresto 24/26mg  BID Spironolactone 12.5mg  daily   Previously tried: ramipril - changed to Entresto  BP goal: <130/80  Family History: Cancer in his brother and mother.  Social History: The patient  reports that he quit smoking about 14 years ago. His smoking use included Cigarettes. He has a 63.00 pack-year smoking history. He has quit using smokeless tobacco. His smokeless tobacco use included Chew. He reports that he does not drink alcohol or use drugs.  Home BP readings: does not have log, but does report that wife has been using home cuff to check and has told him pressures were WNL.   Wt Readings from Last 3 Encounters:  01/19/18 207 lb (93.9 kg)  07/14/17 218 lb (98.9 kg)  12/28/16 219 lb (99.3 kg)   BP Readings from Last 3 Encounters:  02/10/18 122/74  01/19/18 112/78  07/14/17 118/72   Pulse Readings from Last 3 Encounters:  02/10/18 78  01/19/18 97  07/14/17 76    Renal function: CrCl cannot be calculated (Patient's most recent lab result is older than the maximum 21 days allowed.).  Past Medical History:  Diagnosis Date  . 6948-lead   . Arthritis    "maybe a little"  . CAD (coronary artery disease) July 2007   CFX stent (patent Sept '07)  . Cardiac defibrillator CRT-medtronic  01/26/2011  . Chronic systolic congestive heart failure ([place])   . Complication of anesthesia    "problems waking up w/valium"  . High cholesterol   . Hypothyroidism   . ICD (implantable cardiac defibrillator) in place 2006, 2013   Medtronic Protecta XT-DR CRT (01/2012)  . NICM (nonischemic cardiomyopathy) ([place])    ejection fraction 40-45% by echo July 2012  . Non-ischemic cardiomyopathy- EF 40-45% 2D 7/14 02/11/2012  . Obesity (BMI 30-39.9) 08/22/2013  . Ventricular tachycardia, non-sustained ([place]) 2007   Amiodarone    Current Outpatient Medications on File Prior to Visit  Medication Sig Dispense Refill  . amiodarone (PACERONE) 200 MG tablet TAKE 1 TABLET BY MOUTH  DAILY 90 tablet 3  . aspirin 81 MG tablet Take 81 mg by mouth daily.      . carvedilol (COREG) 25 MG tablet TAKE 1 TABLET BY MOUTH TWICE DAILY WITH MEALS 180 tablet 3  . clopidogrel (PLAVIX) 75 MG tablet TAKE 1 TABLET BY MOUTH ONCE DAILY 90 tablet 3  . fish oil-omega-3 fatty acids 1000 MG capsule Take 1 g by mouth daily.     Marland Kitchen levothyroxine (SYNTHROID, LEVOTHROID) 75 MCG tablet Take 1 tablet (75 mcg total) by mouth daily before breakfast. 90 tablet 3  .  Magnesium Oxide 400 MG CAPS Take 1 capsule by mouth daily.     . Multiple Vitamin (MULTIVITAMIN) tablet Take 1 tablet by mouth daily.      . ranolazine (RANEXA) 500 MG 12 hr tablet Take 1 tablet (500 mg total) by mouth 2 (two) times daily. 180 tablet 3  . rosuvastatin (CRESTOR) 20 MG tablet Take 1 tablet (20 mg total) by mouth daily. 90 tablet 3  . spironolactone (ALDACTONE) 25 MG tablet Take 0.5 tablets (12.5 mg total) by mouth daily. 45 tablet 3  . sacubitril-valsartan (ENTRESTO) 24-26 MG Take 1 tablet by mouth 2 (two) times daily. (Patient not taking: Reported on 02/10/2018) 60 tablet 0  . you have a pacemaker book MISC 1 each by Does not apply route once. 1 each 0   No current facility-administered medications on file prior to visit.     No Known Allergies  Blood pressure  122/74, pulse 78.   Assessment/Plan: Hypertension: BMET today. Restart Entresto 24/26mg  BID. Will not titrate dose as he has been without medication for several days and pressure remains below goal. Have provided him samples and instructed him to be compliant with medication until return visit in 2 weeks. Also asked he monitor pressures and bring log to follow up. Continue all other medications as prescribed.    Thank you, Lelan Pons. Patterson Hammersmith, [facility]  02/10/2018 9:22 AM  ADDENDUM: modest increase in Scr. As pre Dr. Caryl Comes repeat BMET in 2 weeks.

## 2018-02-15 ENCOUNTER — Telehealth: Payer: Self-pay | Admitting: Internal Medicine

## 2018-02-15 DIAGNOSIS — N289 Disorder of kidney and ureter, unspecified: Secondary | ICD-10-CM

## 2018-02-15 NOTE — Telephone Encounter (Signed)
Pt will be in Aug 2nd for HTN/Entresto visit. Repeat BMP will be drawn at that time. Pt verbalized understanding and has no additional questions.

## 2018-02-15 NOTE — Telephone Encounter (Signed)
Pt returning call from nurse and stated to leave a message if he don't answer.

## 2018-02-16 LAB — CUP PACEART REMOTE DEVICE CHECK
Battery Voltage: 2.64 V
Brady Statistic AS VP Percent: 10.15 %
Brady Statistic RA Percent Paced: 88.29 %
Date Time Interrogation Session: 20190605062609
HIGH POWER IMPEDANCE MEASURED VALUE: 494 Ohm
HIGH POWER IMPEDANCE MEASURED VALUE: 78 Ohm
Implantable Lead Implant Date: 20130717
Implantable Lead Implant Date: 20130717
Implantable Lead Location: 753859
Implantable Lead Location: 753860
Implantable Lead Model: 4396
Implantable Lead Model: 5076
Implantable Lead Model: 6935
Implantable Pulse Generator Implant Date: 20130717
Lead Channel Impedance Value: 931 Ohm
Lead Channel Pacing Threshold Amplitude: 0.625 V
Lead Channel Pacing Threshold Pulse Width: 0.4 ms
Lead Channel Pacing Threshold Pulse Width: 0.4 ms
Lead Channel Sensing Intrinsic Amplitude: 0.875 mV
Lead Channel Sensing Intrinsic Amplitude: 5.25 mV
Lead Channel Setting Pacing Amplitude: 2 V
Lead Channel Setting Pacing Amplitude: 2.75 V
Lead Channel Setting Sensing Sensitivity: 0.3 mV
MDC IDC LEAD IMPLANT DT: 20130717
MDC IDC LEAD LOCATION: 753858
MDC IDC MSMT LEADCHNL LV IMPEDANCE VALUE: 494 Ohm
MDC IDC MSMT LEADCHNL LV IMPEDANCE VALUE: 570 Ohm
MDC IDC MSMT LEADCHNL LV PACING THRESHOLD AMPLITUDE: 1.625 V
MDC IDC MSMT LEADCHNL RA IMPEDANCE VALUE: 399 Ohm
MDC IDC MSMT LEADCHNL RA PACING THRESHOLD AMPLITUDE: 0.625 V
MDC IDC MSMT LEADCHNL RA PACING THRESHOLD PULSEWIDTH: 0.4 ms
MDC IDC MSMT LEADCHNL RA SENSING INTR AMPL: 0.875 mV
MDC IDC MSMT LEADCHNL RV IMPEDANCE VALUE: 570 Ohm
MDC IDC MSMT LEADCHNL RV SENSING INTR AMPL: 5.25 mV
MDC IDC SET LEADCHNL LV PACING PULSEWIDTH: 0.4 ms
MDC IDC SET LEADCHNL RA PACING AMPLITUDE: 2 V
MDC IDC SET LEADCHNL RV PACING PULSEWIDTH: 0.4 ms
MDC IDC STAT BRADY AP VP PERCENT: 89.16 %
MDC IDC STAT BRADY AP VS PERCENT: 0.53 %
MDC IDC STAT BRADY AS VS PERCENT: 0.16 %
MDC IDC STAT BRADY RV PERCENT PACED: 98.25 %

## 2018-02-25 ENCOUNTER — Ambulatory Visit (INDEPENDENT_AMBULATORY_CARE_PROVIDER_SITE_OTHER): Payer: Medicare HMO | Admitting: Pharmacist

## 2018-02-25 ENCOUNTER — Encounter: Payer: Self-pay | Admitting: Pharmacist

## 2018-02-25 ENCOUNTER — Encounter (INDEPENDENT_AMBULATORY_CARE_PROVIDER_SITE_OTHER): Payer: Self-pay

## 2018-02-25 VITALS — BP 102/66 | HR 71

## 2018-02-25 DIAGNOSIS — I5022 Chronic systolic (congestive) heart failure: Secondary | ICD-10-CM

## 2018-02-25 MED ORDER — SACUBITRIL-VALSARTAN 24-26 MG PO TABS: 1.0000 | ORAL_TABLET | Freq: Two times a day (BID) | ORAL | 0 refills | Status: DC

## 2018-02-25 NOTE — Progress Notes (Addendum)
Patient ID: Jerry Farley                 DOB: 14-Apr-1946                      MRN: [id number]     HPI: Jerry Farley is a 72 y.o. male patient of Dr. Caryl Comes who presents today for hypertension evaluation. PMH significant for CAD, NICM w/ICD, chronic CHF (systolic), HLD. He was recently restarted on Entresto.   He presents today for medication titration. He states that he is doing well, denies any signs/symptoms of dizziness or falls, SOB, and chest pain. He has not missed any doses of his medications in the past 2 weeks. He will complete his supply of Entresto over the weekend.   Current HTN meds:  Carvedilol 25mg  BID Entresto 24/26mg  BID Spironolactone 12.5mg  daily   Previously tried: ramipril - changed to Entresto  BP goal: <130/80  Family History: Cancer in his brother and mother.  Social History: The patient  reports that he quit smoking about 14 years ago. His smoking use included Cigarettes. He has a 63.00 pack-year smoking history. He has quit using smokeless tobacco. His smokeless tobacco use included Chew. He reports that he does not drink alcohol or use drugs.  Home BP readings: wife checked with wrist cuff and told him it was good.   Wt Readings from Last 3 Encounters:  01/19/18 207 lb (93.9 kg)  07/14/17 218 lb (98.9 kg)  12/28/16 219 lb (99.3 kg)   BP Readings from Last 3 Encounters:  02/25/18 102/66  02/10/18 122/74  01/19/18 112/78   Pulse Readings from Last 3 Encounters:  02/25/18 71  02/10/18 78  01/19/18 97    Renal function: CrCl cannot be calculated (Unknown ideal weight.).  Past Medical History:  Diagnosis Date  . 6948-lead   . Arthritis    "maybe a little"  . CAD (coronary artery disease) July 2007   CFX stent (patent Sept '07)  . Cardiac defibrillator CRT-medtronic 01/26/2011  . Chronic systolic congestive heart failure ([place])   . Complication of anesthesia    "problems waking up w/valium"  . High cholesterol   . Hypothyroidism   . ICD  (implantable cardiac defibrillator) in place 2006, 2013   Medtronic Protecta XT-DR CRT (01/2012)  . NICM (nonischemic cardiomyopathy) ([place])    ejection fraction 40-45% by echo July 2012  . Non-ischemic cardiomyopathy- EF 40-45% 2D 7/14 02/11/2012  . Obesity (BMI 30-39.9) 08/22/2013  . Ventricular tachycardia, non-sustained ([place]) 2007   Amiodarone    Current Outpatient Medications on File Prior to Visit  Medication Sig Dispense Refill  . amiodarone (PACERONE) 200 MG tablet TAKE 1 TABLET BY MOUTH  DAILY 90 tablet 3  . aspirin 81 MG tablet Take 81 mg by mouth daily.      . carvedilol (COREG) 25 MG tablet TAKE 1 TABLET BY MOUTH TWICE DAILY WITH MEALS 180 tablet 3  . clopidogrel (PLAVIX) 75 MG tablet TAKE 1 TABLET BY MOUTH ONCE DAILY 90 tablet 3  . fish oil-omega-3 fatty acids 1000 MG capsule Take 1 g by mouth daily.     Marland Kitchen levothyroxine (SYNTHROID, LEVOTHROID) 75 MCG tablet Take 1 tablet (75 mcg total) by mouth daily before breakfast. 90 tablet 3  . Magnesium Oxide 400 MG CAPS Take 1 capsule by mouth daily.     . Multiple Vitamin (MULTIVITAMIN) tablet Take 1 tablet by mouth daily.      . ranolazine (RANEXA)  500 MG 12 hr tablet Take 1 tablet (500 mg total) by mouth 2 (two) times daily. 180 tablet 3  . rosuvastatin (CRESTOR) 20 MG tablet Take 1 tablet (20 mg total) by mouth daily. 90 tablet 3  . sacubitril-valsartan (ENTRESTO) 24-26 MG Take 1 tablet by mouth 2 (two) times daily. 60 tablet 0  . spironolactone (ALDACTONE) 25 MG tablet Take 0.5 tablets (12.5 mg total) by mouth daily. 45 tablet 3  . you have a pacemaker book MISC 1 each by Does not apply route once. 1 each 0   No current facility-administered medications on file prior to visit.     No Known Allergies  Blood pressure 102/66, pulse 71, SpO2 97 %.   Assessment/Plan: Hypertension: BMET today. Will follow-up on SCr, based on results will consider increase of Entresto to 49/51mg  BID. If he changes up to the medium dose, will follow-up  in 2 weeks in HTN clinic.Continue all other medications. Asked patient to have his wife check his blood pressures and call if he has low pressures. If low dose Jerry Farley continues will have him follow up with Dr. Caryl Comes as scheduled and HTN clinic as needed. BMET did not return before end of day so sent for low dose Entresto to pharmacy since pt will run out over weekend.   Thank you, Lelan Pons. Patterson Hammersmith, [facility]  02/25/2018 11:51 AM  ADDENDUM: BMET returned WNL. Will plan to increase Entresto as discussed above. Will have patient use 2 tablets twice daily of his current supply and follow up in HTN clinic in 2-3 weeks.  Unable to reach patient to notify to increase.

## 2018-02-25 NOTE — Patient Instructions (Addendum)
Blood work today - BMET  We will call you and tell you which dose of Entresto to take based on your blood work.   Please take the 30 day voucher card to the pharmacy for 30 days free of the medication.   If you are able please call the insurance to find out what your copay will be and call if it is cost prohibitive [phone]).

## 2018-02-26 LAB — BASIC METABOLIC PANEL
BUN / CREAT RATIO: 12 (ref 10–24)
BUN: 12 mg/dL (ref 8–27)
CHLORIDE: 104 mmol/L (ref 96–106)
CO2: 25 mmol/L (ref 20–29)
Calcium: 9.4 mg/dL (ref 8.6–10.2)
Creatinine, Ser: 1 mg/dL (ref 0.76–1.27)
GFR calc Af Amer: 87 mL/min/{1.73_m2} (ref >59)
GFR calc non Af Amer: 75 mL/min/{1.73_m2} (ref >59)
GLUCOSE: 100 mg/dL — AB (ref 65–99)
Potassium: 4.6 mmol/L (ref 3.5–5.2)
SODIUM: 141 mmol/L (ref 134–144)

## 2018-03-01 ENCOUNTER — Telehealth: Payer: Self-pay

## 2018-03-01 NOTE — Telephone Encounter (Signed)
**Note De-Identified Jerry Farley Obfuscation** I have done an [place] PA through covermymeds. Key: [id number]

## 2018-03-04 NOTE — Telephone Encounter (Signed)
**Note De-Identified Allena Pietila Obfuscation** Letter received Inis Borneman fax from [facility] stating that they have approved the pts [place] PA. Approval good until 03/01/20.  I have notified the pts pharmacy.

## 2018-03-23 ENCOUNTER — Other Ambulatory Visit: Payer: Self-pay | Admitting: Internal Medicine

## 2018-03-23 MED ORDER — SACUBITRIL-VALSARTAN 24-26 MG PO TABS: 1.0000 | ORAL_TABLET | Freq: Two times a day (BID) | ORAL | 10 refills | Status: DC

## 2018-03-23 NOTE — Telephone Encounter (Signed)
Pt's medication was sent to pt's pharmacy as requested. Confirmation received.  °

## 2018-03-25 ENCOUNTER — Encounter: Payer: Self-pay | Admitting: Internal Medicine

## 2018-04-22 ENCOUNTER — Encounter: Payer: Medicare HMO | Admitting: Internal Medicine

## 2018-05-16 ENCOUNTER — Telehealth: Payer: Self-pay | Admitting: Cardiology

## 2018-05-16 NOTE — Telephone Encounter (Signed)
Patient called and stated that he heard an alert tone from his device on Sunday (05-15-18) and Monday (05-16-2018). Remote transmission received. Device reached RRT on 05-15-2018. Explained this to the patient. Pt instructed to keep 05-23-18 appt w/ SK. Pt verbalized understanding.

## 2018-05-23 ENCOUNTER — Encounter: Payer: Self-pay | Admitting: Internal Medicine

## 2018-05-23 ENCOUNTER — Encounter (INDEPENDENT_AMBULATORY_CARE_PROVIDER_SITE_OTHER): Payer: Self-pay

## 2018-05-23 ENCOUNTER — Ambulatory Visit: Payer: Medicare HMO | Admitting: Internal Medicine

## 2018-05-23 VITALS — BP 110/70 | HR 73 | Ht 66.0 in | Wt 219.2 lb

## 2018-05-23 DIAGNOSIS — I2510 Atherosclerotic heart disease of native coronary artery without angina pectoris: Secondary | ICD-10-CM

## 2018-05-23 DIAGNOSIS — Z95810 Presence of automatic (implantable) cardiac defibrillator: Secondary | ICD-10-CM

## 2018-05-23 DIAGNOSIS — I472 Ventricular tachycardia, unspecified: Secondary | ICD-10-CM

## 2018-05-23 DIAGNOSIS — Z79899 Other long term (current) drug therapy: Secondary | ICD-10-CM | POA: Diagnosis not present

## 2018-05-23 DIAGNOSIS — I255 Ischemic cardiomyopathy: Secondary | ICD-10-CM | POA: Diagnosis not present

## 2018-05-23 DIAGNOSIS — I5022 Chronic systolic (congestive) heart failure: Secondary | ICD-10-CM | POA: Diagnosis not present

## 2018-05-23 DIAGNOSIS — I4729 Other ventricular tachycardia: Secondary | ICD-10-CM

## 2018-05-23 LAB — CUP PACEART INCLINIC DEVICE CHECK
Battery Voltage: 2.62 V
Brady Statistic AP VP Percent: 87.02 %
Brady Statistic AS VP Percent: 12.56 %
Brady Statistic AS VS Percent: 0.17 %
HIGH POWER IMPEDANCE MEASURED VALUE: 437 Ohm
HighPow Impedance: 73 Ohm
Implantable Lead Implant Date: 20130717
Implantable Lead Implant Date: 20130717
Implantable Lead Implant Date: 20130717
Implantable Lead Location: 753858
Implantable Lead Model: 4396
Implantable Lead Model: 5076
Implantable Lead Model: 6935
Lead Channel Impedance Value: 399 Ohm
Lead Channel Impedance Value: 399 Ohm
Lead Channel Impedance Value: 551 Ohm
Lead Channel Pacing Threshold Amplitude: 0.75 V
Lead Channel Pacing Threshold Amplitude: 1.5 V
Lead Channel Pacing Threshold Pulse Width: 0.4 ms
Lead Channel Pacing Threshold Pulse Width: 0.4 ms
Lead Channel Sensing Intrinsic Amplitude: 1.875 mV
Lead Channel Sensing Intrinsic Amplitude: 4.5 mV
Lead Channel Setting Pacing Amplitude: 2 V
Lead Channel Setting Pacing Amplitude: 2.5 V
Lead Channel Setting Pacing Pulse Width: 0.4 ms
Lead Channel Setting Pacing Pulse Width: 0.4 ms
MDC IDC LEAD LOCATION: 753859
MDC IDC LEAD LOCATION: 753860
MDC IDC MSMT LEADCHNL LV IMPEDANCE VALUE: 931 Ohm
MDC IDC MSMT LEADCHNL RA PACING THRESHOLD AMPLITUDE: 0.625 V
MDC IDC MSMT LEADCHNL RA PACING THRESHOLD PULSEWIDTH: 0.4 ms
MDC IDC MSMT LEADCHNL RA SENSING INTR AMPL: 2.125 mV
MDC IDC MSMT LEADCHNL RV IMPEDANCE VALUE: 551 Ohm
MDC IDC MSMT LEADCHNL RV SENSING INTR AMPL: 4.75 mV
MDC IDC PG IMPLANT DT: 20130717
MDC IDC SESS DTM: 20191028163226
MDC IDC SET LEADCHNL RA PACING AMPLITUDE: 2 V
MDC IDC SET LEADCHNL RV SENSING SENSITIVITY: 0.3 mV
MDC IDC STAT BRADY AP VS PERCENT: 0.26 %
MDC IDC STAT BRADY RA PERCENT PACED: 84.68 %
MDC IDC STAT BRADY RV PERCENT PACED: 97.13 %

## 2018-05-23 NOTE — Progress Notes (Signed)
Was      Patient Care Team: System, Pcp Not In as PCP - General   HPI  Jerry Farley is a 72 y.o. male Seen in followup for ICD implanted to remotely for which he underwent device generator replacement 7/13. Indication was polymorphic ventricular tachycardia for which he takes amiodarone. This occurrs in the context of nonischemic cardiomyopathy. He also has a history of a 6949-lead which was revised the time of generator replacement   He also underwent CRT upgrade of atrial lead and an LV lead.   He also has coronary artery disease with prior stenting of his circumflex.  Ventricular tachycardia managed with combination  amiodarone and ranolazine   DATE TEST    2011 myoview   EF 29 % Inferoapical scar  7/14 Echo    EF 40-45% % LAE mild  12/16 Echo  Ef 40-45%       Date Cr K TSH LFTs PFTs  11/16    0.48 28    6/17    1.6 30    8/18   4.96 (on synthroid) 30   12/18 1.1 4.8 2.19 33   8/19 1.0 4.6 3.06 32             The patient denies chest pain, shortness of breath, nocturnal dyspnea, orthopnea or peripheral edema.  There have been no palpitations, lightheadedness or syncope.    Patient denies symptoms of GI intolerance neurological symptoms attributable to amiodarone.  Some  sun sensitivity so keeps himself covered      Past Medical History:  Diagnosis Date  . 6948-lead   . Arthritis    "maybe a little"  . CAD (coronary artery disease) July 2007   CFX stent (patent Sept '07)  . Cardiac defibrillator CRT-medtronic 01/26/2011  . Chronic systolic congestive heart failure ([place])   . Complication of anesthesia    "problems waking up w/valium"  . High cholesterol   . Hypothyroidism   . ICD (implantable cardiac defibrillator) in place 2006, 2013   Medtronic Protecta XT-DR CRT (01/2012)  . NICM (nonischemic cardiomyopathy) ([place])    ejection fraction 40-45% by echo July 2012  . Non-ischemic cardiomyopathy- EF 40-45% 2D 7/14 02/11/2012  . Obesity (BMI 30-39.9) 08/22/2013    . Ventricular tachycardia, non-sustained ([place]) 2007   Amiodarone    Past Surgical History:  Procedure Laterality Date  . BI-VENTRICULAR IMPLANTABLE CARDIOVERTER DEFIBRILLATOR UPGRADE N/A 02/10/2012   Procedure: BI-VENTRICULAR IMPLANTABLE CARDIOVERTER DEFIBRILLATOR UPGRADE;  Surgeon: Deboraha Sprang, MD;  Location: [facility] CATH LAB;  Service: Cardiovascular;  Laterality: N/A;  . CARDIAC CATHETERIZATION  11/30/2001   non-ischemic cardiomyopahty with high LV end-diastolic pressure; 03% distal L main stenosis, large osital stenosis 70%, 70% Cfx stenosis (Dr. Domenic Moras)  . CARDIAC CATHETERIZATION  9/07   CFX stent patent [employer])  . CARDIAC DEFIBRILLATOR PLACEMENT  09/2004; 02/10/12   ICD implanted in 2006 (Dr. Rito Ehrlich); EOL generator change in 01/2012 - CRT (BiV ICD) (Dr. Caryl Comes)  . CORONARY ANGIOPLASTY WITH STENT PLACEMENT   01/2006   CFX   . NM MYOCAR PERF EJECTION FRACTION  05/2010   dipyridamole myoview; mod perfusion defect in apical, basal inferior, mid inferior & apical inferior regions (infarct/scar); post-stress 28%, low risk scan  . TONSILLECTOMY AND ADENOIDECTOMY     "when I was a child"  . TRANSTHORACIC ECHOCARDIOGRAM  01/2013   EF 40-45%, LV mildly dilated, mild LVH, mod hypokinesis of inferior myocardium, grade 1 diastolic dysfunction; mild MR    Current Outpatient Medications  Medication Sig Dispense Refill  . amiodarone (PACERONE) 200 MG tablet TAKE 1 TABLET BY MOUTH  DAILY 90 tablet 3  . aspirin 81 MG tablet Take 81 mg by mouth daily.      . carvedilol (COREG) 25 MG tablet TAKE 1 TABLET BY MOUTH TWICE DAILY WITH MEALS 180 tablet 3  . clopidogrel (PLAVIX) 75 MG tablet TAKE 1 TABLET BY MOUTH ONCE DAILY 90 tablet 3  . fish oil-omega-3 fatty acids 1000 MG capsule Take 1 g by mouth daily.     Marland Kitchen levothyroxine (SYNTHROID, LEVOTHROID) 75 MCG tablet Take 1 tablet (75 mcg total) by mouth daily before breakfast. 90 tablet 3  . Magnesium Oxide 400 MG CAPS Take 1 capsule by mouth daily.     .  Multiple Vitamin (MULTIVITAMIN) tablet Take 1 tablet by mouth daily.      . ranolazine (RANEXA) 500 MG 12 hr tablet Take 1 tablet (500 mg total) by mouth 2 (two) times daily. 180 tablet 3  . rosuvastatin (CRESTOR) 20 MG tablet Take 1 tablet (20 mg total) by mouth daily. 90 tablet 3  . sacubitril-valsartan (ENTRESTO) 24-26 MG Take 1 tablet by mouth 2 (two) times daily. 60 tablet 10  . spironolactone (ALDACTONE) 25 MG tablet Take 0.5 tablets (12.5 mg total) by mouth daily. 45 tablet 3   No current facility-administered medications for this visit.     No Known Allergies  Review of Systems negative except from HPI and PMH  Physical Exam BP 110/70   Pulse 73   Ht 5\' 6"  (1.676 m)   Wt 219 lb 3.2 oz (99.4 kg)   SpO2 97%   BMI 35.38 kg/m  Well developed and nourished in no acute distress HENT normal Neck supple with JVP-flat Clear Device pocket well healed; without hematoma or erythema.  There is no tethering  Regular rate and rhythm, no murmurs or gallops Abd-soft with active BS No Clubbing cyanosis edema Skin-warm and dry A & Oriented  Grossly normal sensory and motor function   ECG today P-synchronous/ AV  Pacing Upright QRS V1 and neg lead 1 PVC   Assessment and  Plan  Polymorphic ventricular tachycardia-- intercurrent nonsustained episodes  Nonischemic cardiomyopathy  Ventricular tachycardia-monomorphic-paced terminated  Coronary artery disease with prior stenting  Amiodarone  Dyslipidemia  Heart failure-chronic-mixed  CRT-[employer]  Hypothyroidism treated   ,No intercurrent Ventricular tachycardia  Euvolemic continue current meds  Tolerating amiodarone  Continue  Without symptoms of ischemia  Device has reached ERI  Will schedule device generator replacement Will use AEGIS pouch  euthryoid  Will check labs again at replacement   We spent more than 50% of our >25 min visit in face to face counseling regarding the above

## 2018-05-26 ENCOUNTER — Encounter: Payer: Self-pay | Admitting: *Deleted

## 2018-05-26 ENCOUNTER — Telehealth: Payer: Self-pay | Admitting: Internal Medicine

## 2018-05-26 DIAGNOSIS — Z01812 Encounter for preprocedural laboratory examination: Secondary | ICD-10-CM

## 2018-05-26 DIAGNOSIS — I428 Other cardiomyopathies: Secondary | ICD-10-CM

## 2018-05-26 DIAGNOSIS — I5022 Chronic systolic (congestive) heart failure: Secondary | ICD-10-CM

## 2018-05-26 NOTE — Telephone Encounter (Signed)
ERI 05/15/18 Scheduled pt for gen change 11/29. Letter of instructions reviewed with patient and left at front desk, along w/ surgical scrub & instructions. Pt will stop by the office 11/15 for pre labs. Post procedure appts scheduled. Patient verbalized understanding and agreeable to plan.

## 2018-05-26 NOTE — Telephone Encounter (Signed)
Follow up:  Patient calling concerning his battery.

## 2018-05-26 NOTE — Telephone Encounter (Signed)
New Message   Patient is calling because he would like to discuss a possible battery change for his defibrillator. Please call.

## 2018-06-10 ENCOUNTER — Other Ambulatory Visit: Payer: Medicare HMO | Admitting: *Deleted

## 2018-06-10 DIAGNOSIS — Z01812 Encounter for preprocedural laboratory examination: Secondary | ICD-10-CM | POA: Diagnosis not present

## 2018-06-10 DIAGNOSIS — I5022 Chronic systolic (congestive) heart failure: Secondary | ICD-10-CM | POA: Diagnosis not present

## 2018-06-10 DIAGNOSIS — I428 Other cardiomyopathies: Secondary | ICD-10-CM | POA: Diagnosis not present

## 2018-06-10 LAB — CBC
HEMOGLOBIN: 13.5 g/dL (ref 13.0–17.7)
Hematocrit: 40.6 % (ref 37.5–51.0)
MCH: 32.4 pg (ref 26.6–33.0)
MCHC: 33.3 g/dL (ref 31.5–35.7)
MCV: 97 fL (ref 79–97)
Platelets: 173 10*3/uL (ref 150–450)
RBC: 4.17 x10E6/uL (ref 4.14–5.80)
RDW: 12.4 % (ref 12.3–15.4)
WBC: 6.9 10*3/uL (ref 3.4–10.8)

## 2018-06-10 LAB — BASIC METABOLIC PANEL
BUN/Creatinine Ratio: 12 (ref 10–24)
BUN: 14 mg/dL (ref 8–27)
CO2: 25 mmol/L (ref 20–29)
Calcium: 9.5 mg/dL (ref 8.6–10.2)
Chloride: 103 mmol/L (ref 96–106)
Creatinine, Ser: 1.2 mg/dL (ref 0.76–1.27)
GFR calc Af Amer: 69 mL/min/{1.73_m2} (ref >59)
GFR, EST NON AFRICAN AMERICAN: 60 mL/min/{1.73_m2} (ref >59)
Glucose: 106 mg/dL — ABNORMAL HIGH (ref 65–99)
Potassium: 4.6 mmol/L (ref 3.5–5.2)
Sodium: 142 mmol/L (ref 134–144)

## 2018-06-24 ENCOUNTER — Encounter (HOSPITAL_COMMUNITY): Payer: Self-pay | Admitting: Internal Medicine

## 2018-06-24 ENCOUNTER — Ambulatory Visit (HOSPITAL_COMMUNITY)
Admission: RE | Disposition: A | Discharge status: Home / Self Care | Payer: Self-pay | Source: Ambulatory Visit | Attending: Internal Medicine

## 2018-06-24 ENCOUNTER — Ambulatory Visit (HOSPITAL_COMMUNITY)
Admission: RE | Admit: 2018-06-24 | Discharge: 2018-06-24 | Disposition: A | Discharge status: Home / Self Care | Payer: Medicare HMO | Source: Ambulatory Visit | Attending: Internal Medicine | Admitting: Internal Medicine

## 2018-06-24 ENCOUNTER — Other Ambulatory Visit: Payer: Self-pay

## 2018-06-24 DIAGNOSIS — I2510 Atherosclerotic heart disease of native coronary artery without angina pectoris: Secondary | ICD-10-CM | POA: Insufficient documentation

## 2018-06-24 DIAGNOSIS — I428 Other cardiomyopathies: Secondary | ICD-10-CM | POA: Diagnosis not present

## 2018-06-24 DIAGNOSIS — I472 Ventricular tachycardia: Secondary | ICD-10-CM | POA: Insufficient documentation

## 2018-06-24 DIAGNOSIS — Z7982 Long term (current) use of aspirin: Secondary | ICD-10-CM | POA: Insufficient documentation

## 2018-06-24 DIAGNOSIS — I4729 Other ventricular tachycardia: Secondary | ICD-10-CM

## 2018-06-24 DIAGNOSIS — Z79890 Hormone replacement therapy (postmenopausal): Secondary | ICD-10-CM | POA: Insufficient documentation

## 2018-06-24 DIAGNOSIS — Z79899 Other long term (current) drug therapy: Secondary | ICD-10-CM | POA: Diagnosis not present

## 2018-06-24 DIAGNOSIS — E785 Hyperlipidemia, unspecified: Secondary | ICD-10-CM | POA: Diagnosis not present

## 2018-06-24 DIAGNOSIS — M1990 Unspecified osteoarthritis, unspecified site: Secondary | ICD-10-CM | POA: Insufficient documentation

## 2018-06-24 DIAGNOSIS — Z87891 Personal history of nicotine dependence: Secondary | ICD-10-CM | POA: Diagnosis not present

## 2018-06-24 DIAGNOSIS — E039 Hypothyroidism, unspecified: Secondary | ICD-10-CM | POA: Diagnosis not present

## 2018-06-24 DIAGNOSIS — I5022 Chronic systolic (congestive) heart failure: Secondary | ICD-10-CM | POA: Insufficient documentation

## 2018-06-24 DIAGNOSIS — Z9889 Other specified postprocedural states: Secondary | ICD-10-CM | POA: Diagnosis not present

## 2018-06-24 DIAGNOSIS — Z955 Presence of coronary angioplasty implant and graft: Secondary | ICD-10-CM | POA: Insufficient documentation

## 2018-06-24 DIAGNOSIS — Z4502 Encounter for adjustment and management of automatic implantable cardiac defibrillator: Secondary | ICD-10-CM | POA: Diagnosis not present

## 2018-06-24 DIAGNOSIS — Z006 Encounter for examination for normal comparison and control in clinical research program: Secondary | ICD-10-CM | POA: Insufficient documentation

## 2018-06-24 DIAGNOSIS — Z7902 Long term (current) use of antithrombotics/antiplatelets: Secondary | ICD-10-CM | POA: Diagnosis not present

## 2018-06-24 DIAGNOSIS — E669 Obesity, unspecified: Secondary | ICD-10-CM | POA: Insufficient documentation

## 2018-06-24 HISTORY — PX: BIV ICD GENERATOR CHANGEOUT: EP1194

## 2018-06-24 LAB — SURGICAL PCR SCREEN
MRSA, PCR: NEGATIVE
Staphylococcus aureus: NEGATIVE

## 2018-06-24 SURGERY — BIV ICD GENERATOR CHANGEOUT
Anesthesia: LOCAL

## 2018-06-24 MED ORDER — MIDAZOLAM HCL 5 MG/5ML IJ SOLN
INTRAMUSCULAR | Status: DC | PRN
  Administered 2018-06-24: 2 mg via INTRAVENOUS

## 2018-06-24 MED ORDER — CHLORHEXIDINE GLUCONATE 4 % EX LIQD
60.0000 mL | Freq: Once | CUTANEOUS | Status: DC
  Filled 2018-06-24: qty 60

## 2018-06-24 MED ORDER — SODIUM CHLORIDE 0.9 % IV SOLN
INTRAVENOUS | Status: DC
  Administered 2018-06-24: 07:00:00 via INTRAVENOUS

## 2018-06-24 MED ORDER — CEFAZOLIN SODIUM-DEXTROSE 2-4 GM/100ML-% IV SOLN
2.0000 g | INTRAVENOUS | Status: AC
  Administered 2018-06-24: 2 g via INTRAVENOUS
  Filled 2018-06-24: qty 100

## 2018-06-24 MED ORDER — FENTANYL CITRATE (PF) 100 MCG/2ML IJ SOLN
INTRAMUSCULAR | Status: AC
  Filled 2018-06-24: qty 2

## 2018-06-24 MED ORDER — ONDANSETRON HCL 4 MG/2ML IJ SOLN: 4.0000 mg | Freq: Four times a day (QID) | INTRAMUSCULAR | Status: DC | PRN

## 2018-06-24 MED ORDER — FENTANYL CITRATE (PF) 100 MCG/2ML IJ SOLN
INTRAMUSCULAR | Status: DC | PRN
  Administered 2018-06-24: 25 ug via INTRAVENOUS

## 2018-06-24 MED ORDER — MUPIROCIN 2 % EX OINT
1.0000 "application " | TOPICAL_OINTMENT | Freq: Once | CUTANEOUS | Status: AC
  Administered 2018-06-24: 1 via NASAL
  Filled 2018-06-24: qty 22

## 2018-06-24 MED ORDER — LIDOCAINE HCL (PF) 1 % IJ SOLN
INTRAMUSCULAR | Status: DC | PRN
  Administered 2018-06-24: 45 mL

## 2018-06-24 MED ORDER — SODIUM CHLORIDE 0.9 % IV SOLN
INTRAVENOUS | Status: AC
  Filled 2018-06-24: qty 2

## 2018-06-24 MED ORDER — ACETAMINOPHEN 325 MG PO TABS
325.0000 mg | ORAL_TABLET | ORAL | Status: DC | PRN
  Filled 2018-06-24: qty 2

## 2018-06-24 MED ORDER — MIDAZOLAM HCL 5 MG/5ML IJ SOLN
INTRAMUSCULAR | Status: AC
  Filled 2018-06-24: qty 5

## 2018-06-24 MED ORDER — CEFAZOLIN SODIUM-DEXTROSE 2-4 GM/100ML-% IV SOLN
INTRAVENOUS | Status: AC
  Filled 2018-06-24: qty 100

## 2018-06-24 MED ORDER — SODIUM CHLORIDE 0.9 % IV SOLN
80.0000 mg | INTRAVENOUS | Status: AC
  Administered 2018-06-24: 80 mg
  Filled 2018-06-24: qty 2

## 2018-06-24 MED ORDER — MUPIROCIN 2 % EX OINT
TOPICAL_OINTMENT | CUTANEOUS | Status: AC
  Administered 2018-06-24: 1 via NASAL
  Filled 2018-06-24: qty 22

## 2018-06-24 MED ORDER — SODIUM CHLORIDE 0.9 % IV SOLN: INTRAVENOUS | Status: DC

## 2018-06-24 MED ORDER — LIDOCAINE HCL 1 % IJ SOLN
INTRAMUSCULAR | Status: AC
  Filled 2018-06-24: qty 60

## 2018-06-24 SURGICAL SUPPLY — 5 items
CABLE SURGICAL S-101-97-12 (CABLE) ×1 IMPLANT
HEMOSTAT SURGICEL 2X4 FIBR (HEMOSTASIS) ×1 IMPLANT
ICD UNIFY ASUR CRT CD3357-40Q (ICD Generator) ×1 IMPLANT
PAD PRO RADIOLUCENT 2001M-C (PAD) ×1 IMPLANT
TRAY PACEMAKER INSERTION (PACKS) ×1 IMPLANT

## 2018-06-24 NOTE — Discharge Instructions (Signed)

## 2018-06-24 NOTE — Interval H&P Note (Signed)
ICD Criteria  Current LVEF: 45%. Within 12 months prior to implant: No   Heart failure history: Yes, Class II  Cardiomyopathy history: Yes, Mixed Ischemic and Non-Ischemic Cardiomyopathies.  Atrial Fibrillation/Atrial Flutter: No.  Ventricular tachycardia history: Yes, Hemodynamic instability present. VT Type: Sustained Ventricular Tachycardia - Monomorphic and Polymorphic.  Cardiac arrest history: No.  History of syndromes with risk of sudden death: No.  Previous ICD: Yes, Reason for ICD:  Secondary prevention.  Current ICD indication: Secondary  PPM indication: No.  Class I or II Bradycardia indication present: No  Beta Blocker therapy for 3 or more months: Yes, prescribed.   Ace Inhibitor/ARB therapy for 3 or more months: Yes, prescribed.    I have seen Jerry Farley is a 72 y.o. male   for consideration of ICD reimplant for secondary prevention of sudden death.  The patient's chart has been reviewed and they meet criteria for ICD implant.  I have had a thorough discussion with the patient reviewing options.  The patient and their family (if available) have had opportunities to ask questions and have them answered. The patient and I have decided together through the [place] [employer] to reimplant  ICD at this time.  Risks, benefits, alternatives to ICD implantation were discussed in detail with the patient today. The patient  understands that the risks include but are not limited to bleeding, infection, pneumothorax, perforation, tamponade, vascular damage, renal failure, MI, stroke, death, inappropriate shocks, and lead dislodgement and   wishes to proceed.  History and Physical Interval Note:  06/24/2018 7:54 AM  [name]  has presented today for surgery, with the diagnosis of biv eol cardiomyopathy  The various methods of treatment have been discussed with the patient and family. After consideration of risks, benefits and other options for  treatment, the patient has consented to  Procedure(s): BIV ICD [place] (N/A) as a surgical intervention .  The patient's history has been reviewed, patient examined, no change in status, stable for surgery.  I have reviewed the patient's chart and labs.  Questions were answered to the patient's satisfaction.     Jerry Farley

## 2018-06-24 NOTE — H&P (Signed)
Patient Care Team: Baxter Hire, MD as PCP - General (Internal Medicine)   HPI  Jerry Farley is a 72 y.o. male admitted for CRT ICD generator replacement .     Initially implanted remotely;  underwent device generator replacement 7/13. Indication was polymorphic ventricular tachycardia for which he takes amiodarone. This occurrs in the context of nonischemic cardiomyopathy. He also has a history of a 6949-lead which was revised the time of generator replacement   He also underwent CRT upgrade of atrial lead and an LV lead.   He has coronary artery disease with prior stenting of his circumflex.  Ventricular tachycardia managed with combination  amiodarone and ranolazine   DATE TEST    2011 myoview   EF 29 % Inferoapical scar  7/14 Echo    EF 40-45% % LAE mild  12/16 Echo  Ef 40-45%       Date Cr K TSH LFTs PFTs  11/16    0.48 28    6/17    1.6 30    8/18   4.96 (on synthroid) 30   12/18 1.1 4.8 2.19 33   8/19 1.0 4.6 3.06 32            The patient denies chest pain, shortness of breath, nocturnal dyspnea, orthopnea or peripheral edema.  There have been no palpitations, lightheadedness or syncope.   Records and Results Reviewed    Past Medical History:  Diagnosis Date  . 6948-lead   . Arthritis    "maybe a little"  . CAD (coronary artery disease) July 2007   CFX stent (patent Sept '07)  . Cardiac defibrillator CRT-medtronic 01/26/2011  . Chronic systolic congestive heart failure ([place])   . Complication of anesthesia    "problems waking up w/valium"  . High cholesterol   . Hypothyroidism   . ICD (implantable cardiac defibrillator) in place 2006, 2013   Medtronic Protecta XT-DR CRT (01/2012)  . NICM (nonischemic cardiomyopathy) ([place])    ejection fraction 40-45% by echo July 2012  . Non-ischemic cardiomyopathy- EF 40-45% 2D 7/14 02/11/2012  . Obesity (BMI 30-39.9) 08/22/2013  . Ventricular tachycardia, non-sustained ([place]) 2007   Amiodarone    Past Surgical History:  Procedure Laterality Date  . BI-VENTRICULAR IMPLANTABLE CARDIOVERTER DEFIBRILLATOR UPGRADE N/A 02/10/2012   Procedure: BI-VENTRICULAR IMPLANTABLE CARDIOVERTER DEFIBRILLATOR UPGRADE;  Surgeon: Deboraha Sprang, MD;  Location: [facility] CATH LAB;  Service: Cardiovascular;  Laterality: N/A;  . CARDIAC CATHETERIZATION  11/30/2001   non-ischemic cardiomyopahty with high LV end-diastolic pressure; 66% distal L main stenosis, large osital stenosis 70%, 70% Cfx stenosis (Dr. Domenic Moras)  . CARDIAC CATHETERIZATION  9/07   CFX stent patent [employer])  . CARDIAC DEFIBRILLATOR PLACEMENT  09/2004; 02/10/12   ICD implanted in 2006 (Dr. Rito Ehrlich); EOL generator change in 01/2012 - CRT (BiV ICD) (Dr. Caryl Comes)  . CORONARY ANGIOPLASTY WITH STENT PLACEMENT   01/2006   CFX   . NM MYOCAR PERF EJECTION FRACTION  05/2010   dipyridamole myoview; mod perfusion defect in apical, basal inferior, mid inferior & apical inferior regions (infarct/scar); post-stress 28%, low risk scan  . TONSILLECTOMY AND ADENOIDECTOMY     "when I was a child"  . TRANSTHORACIC ECHOCARDIOGRAM  01/2013   EF 40-45%, LV mildly dilated, mild LVH, mod hypokinesis of inferior myocardium, grade 1 diastolic dysfunction; mild MR    Current Facility-Administered Medications  Medication Dose Route Frequency Provider Last Rate Last Dose  . 0.9 %  sodium chloride infusion  Intravenous Continuous Deboraha Sprang, MD 50 mL/hr at 06/24/18 [phone]    . ceFAZolin (ANCEF) IVPB 2g/100 mL premix  2 g Intravenous On Call Deboraha Sprang, MD      . chlorhexidine (HIBICLENS) 4 % liquid 4 application  60 mL Topical Once Deboraha Sprang, MD      . gentamicin (GARAMYCIN) 80 mg in sodium chloride 0.9 % 500 mL irrigation  80 mg Irrigation On Call Deboraha Sprang, MD        No Known Allergies    Social History   Tobacco Use  . Smoking status: Former Smoker    Packs/day: 1.50    Years: 42.00    Pack years: 63.00    Types: Cigarettes    Last  attempt to quit: 07/28/2003    Years since quitting: 14.9  . Smokeless tobacco: Former Systems developer    Types: Chew  . Tobacco comment: "didn't chew but a year or 2; don't know when I quit"  Substance Use Topics  . Alcohol use: No  . Drug use: No     Family History  Problem Relation Age of Onset  . Cancer Mother   . Cancer Brother      Current Meds  Medication Sig  . amiodarone (PACERONE) 200 MG tablet TAKE 1 TABLET BY MOUTH  DAILY  . aspirin 81 MG tablet Take 81 mg by mouth daily.    . carvedilol (COREG) 25 MG tablet TAKE 1 TABLET BY MOUTH TWICE DAILY WITH MEALS  . clopidogrel (PLAVIX) 75 MG tablet TAKE 1 TABLET BY MOUTH ONCE DAILY  . fish oil-omega-3 fatty acids 1000 MG capsule Take 1 g by mouth 4 (four) times a week.   . levothyroxine (SYNTHROID, LEVOTHROID) 75 MCG tablet Take 1 tablet (75 mcg total) by mouth daily before breakfast.  . Magnesium Oxide 400 MG CAPS Take 1 capsule by mouth daily.   . Multiple Vitamin (MULTIVITAMIN) tablet Take 1 tablet by mouth daily.    . ranolazine (RANEXA) 500 MG 12 hr tablet Take 1 tablet (500 mg total) by mouth 2 (two) times daily.  . rosuvastatin (CRESTOR) 20 MG tablet Take 1 tablet (20 mg total) by mouth daily.  . sacubitril-valsartan (ENTRESTO) 24-26 MG Take 1 tablet by mouth 2 (two) times daily.  Marland Kitchen spironolactone (ALDACTONE) 25 MG tablet Take 0.5 tablets (12.5 mg total) by mouth daily.     Review of Systems negative except from HPI and PMH  Physical Exam BP 119/66   Pulse 71   Temp 97.7 F (36.5 C) (Oral)   Resp 18   Ht 5\' 8"  (1.727 m)   Wt 96.2 kg   SpO2 100%   BMI 32.23 kg/m  Well developed and well nourished in no acute distress HENT normal E scleral and icterus clear Neck Supple JVP flat; carotids brisk and full Clear to ausculation Regular rate and rhythm, no murmurs gallops or rub Soft with active bowel sounds No clubbing cyanosis  Edema Alert and oriented, grossly normal motor and sensory function Skin Warm and  Dry    Assessment and  Plan Polymorphic ventricular tachycardia-- intercurrent nonsustained episodes  Nonischemic cardiomyopathy  Ventricular tachycardia-monomorphic-paced terminated  Coronary artery disease with prior stenting  Amiodarone  Dyslipidemia  Heart failure-chronic-mixed  CRT-D Medtronic   Hypothyroidism treated  Functionally stable  We have reviewed the benefits and risks of generator replacement.  These include but are not limited to lead fracture and infection.  The patient understands, agrees and is willing to  proceed.

## 2018-06-27 MED FILL — Lidocaine HCl Local Inj 1%: INTRAMUSCULAR | Qty: 60 | Status: AC

## 2018-07-06 ENCOUNTER — Ambulatory Visit (INDEPENDENT_AMBULATORY_CARE_PROVIDER_SITE_OTHER): Payer: Medicare HMO | Admitting: *Deleted

## 2018-07-06 DIAGNOSIS — I428 Other cardiomyopathies: Secondary | ICD-10-CM

## 2018-07-06 NOTE — Progress Notes (Signed)
CRT-D wound check in office. Wound check appointment. Dermabond removed. Wound without redness or edema. Incision edges approximated, wound well healed. Thresholds and sensing consistent with previous device measurements. Lead impedance trends stable over time. No mode switch episodes recorded. No ventricular arrhythmia episodes recorded. Patient bi-ventricularly pacing 95 % of the time. Device programmed with appropriate safety margins. Heart failure diagnostics reviewed and trends are stable for patient. vibratory alerts demonstrated for patient. No changes made. Patient educated about wound care, arm mobility, shock plan. ROV 09/26/2018

## 2018-07-27 ENCOUNTER — Other Ambulatory Visit: Payer: Self-pay | Admitting: Internal Medicine

## 2018-08-10 DIAGNOSIS — Z0001 Encounter for general adult medical examination with abnormal findings: Secondary | ICD-10-CM | POA: Diagnosis not present

## 2018-08-10 DIAGNOSIS — Z0000 Encounter for general adult medical examination without abnormal findings: Secondary | ICD-10-CM | POA: Diagnosis not present

## 2018-08-10 DIAGNOSIS — I2510 Atherosclerotic heart disease of native coronary artery without angina pectoris: Secondary | ICD-10-CM | POA: Diagnosis not present

## 2018-08-10 DIAGNOSIS — E034 Atrophy of thyroid (acquired): Secondary | ICD-10-CM | POA: Diagnosis not present

## 2018-08-10 DIAGNOSIS — Z125 Encounter for screening for malignant neoplasm of prostate: Secondary | ICD-10-CM | POA: Diagnosis not present

## 2018-08-10 DIAGNOSIS — E7800 Pure hypercholesterolemia, unspecified: Secondary | ICD-10-CM | POA: Diagnosis not present

## 2018-08-10 DIAGNOSIS — Z95810 Presence of automatic (implantable) cardiac defibrillator: Secondary | ICD-10-CM | POA: Diagnosis not present

## 2018-08-24 ENCOUNTER — Other Ambulatory Visit: Payer: Self-pay | Admitting: Internal Medicine

## 2018-09-05 DIAGNOSIS — J019 Acute sinusitis, unspecified: Secondary | ICD-10-CM | POA: Diagnosis not present

## 2018-09-26 ENCOUNTER — Ambulatory Visit: Payer: Medicare HMO | Admitting: Internal Medicine

## 2018-09-26 ENCOUNTER — Encounter: Payer: Self-pay | Admitting: Internal Medicine

## 2018-09-26 VITALS — BP 112/74 | HR 72 | Ht 68.0 in | Wt 222.6 lb

## 2018-09-26 DIAGNOSIS — I472 Ventricular tachycardia: Secondary | ICD-10-CM

## 2018-09-26 DIAGNOSIS — I2510 Atherosclerotic heart disease of native coronary artery without angina pectoris: Secondary | ICD-10-CM

## 2018-09-26 DIAGNOSIS — I4729 Other ventricular tachycardia: Secondary | ICD-10-CM

## 2018-09-26 DIAGNOSIS — I5022 Chronic systolic (congestive) heart failure: Secondary | ICD-10-CM

## 2018-09-26 DIAGNOSIS — E782 Mixed hyperlipidemia: Secondary | ICD-10-CM

## 2018-09-26 DIAGNOSIS — Z95810 Presence of automatic (implantable) cardiac defibrillator: Secondary | ICD-10-CM

## 2018-09-26 DIAGNOSIS — Z79899 Other long term (current) drug therapy: Secondary | ICD-10-CM

## 2018-09-26 DIAGNOSIS — I428 Other cardiomyopathies: Secondary | ICD-10-CM

## 2018-09-26 NOTE — Progress Notes (Signed)
Was      Patient Care Team: Baxter Hire, MD as PCP - General (Internal Medicine)   HPI  Jerry Farley is a 73 y.o. male Seen in followup for ICD implanted to remotely for which he underwent device generator replacement 7/13. Indication was polymorphic ventricular tachycardia for which he takes amiodarone. This occurrs in the context of nonischemic cardiomyopathy. He also has a history of a 6949-lead which was revised the time of CRT upgrade receiving an atrial lead and an LV lead.   He also has coronary artery disease with prior stenting of his circumflex.  Ventricular tachycardia managed with combination  amiodarone and ranolazine   Device reached ERI and underwent generator replacement 10/19 and for reasons that I do not recall received a St Jude replacement of a Medtronic device  DATE TEST    2011 myoview   EF 29 % Inferoapical scar  7/14 Echo    EF 40-45% % LAE mild  12/16 Echo  Ef 40-45%       Date Cr K TSH LFTs PFTs  11/16    0.48 28    6/17    1.6 30    8/18   4.96 (on synthroid) 30   12/18 1.1 4.8 2.19 33   8/19 1.0 4.6 3.06 32    1/20 1.1 4.4 5.95 30      The patient denies chest pain, shortness of breath, nocturnal dyspnea, orthopnea or peripheral edema.  There have been no palpitations, lightheadedness or syncope.      Patient denies symptoms of GI intolerance, sun sensitivity, neurological symptoms attributable to amiodarone.        Past Medical History:  Diagnosis Date  . 6948-lead   . Arthritis    "maybe a little"  . CAD (coronary artery disease) July 2007   CFX stent (patent Sept '07)  . Cardiac defibrillator CRT-medtronic 01/26/2011  . Chronic systolic congestive heart failure ([place])   . Complication of anesthesia    "problems waking up w/valium"  . High cholesterol   . Hypothyroidism   . ICD (implantable cardiac defibrillator) in place 2006, 2013   Medtronic Protecta XT-DR CRT (01/2012)  . NICM (nonischemic cardiomyopathy) ([place])    ejection fraction 40-45% by echo July 2012  . Non-ischemic cardiomyopathy- EF 40-45% 2D 7/14 02/11/2012  . Obesity (BMI 30-39.9) 08/22/2013  . Ventricular tachycardia, non-sustained ([place]) 2007   Amiodarone    Past Surgical History:  Procedure Laterality Date  . BI-VENTRICULAR IMPLANTABLE CARDIOVERTER DEFIBRILLATOR UPGRADE N/A 02/10/2012   Procedure: BI-VENTRICULAR IMPLANTABLE CARDIOVERTER DEFIBRILLATOR UPGRADE;  Surgeon: Deboraha Sprang, MD;  Location: [facility] CATH LAB;  Service: Cardiovascular;  Laterality: N/A;  . BIV ICD GENERATOR CHANGEOUT N/A 06/24/2018   Procedure: BIV ICD GENERATOR CHANGEOUT;  Surgeon: Deboraha Sprang, MD;  Location: [place] CV LAB;  Service: Cardiovascular;  Laterality: N/A;  . CARDIAC CATHETERIZATION  11/30/2001   non-ischemic cardiomyopahty with high LV end-diastolic pressure; 40% distal L main stenosis, large osital stenosis 70%, 70% Cfx stenosis (Dr. Domenic Moras)  . CARDIAC CATHETERIZATION  9/07   CFX stent patent [employer])  . CARDIAC DEFIBRILLATOR PLACEMENT  09/2004; 02/10/12   ICD implanted in 2006 (Dr. Rito Ehrlich); EOL generator change in 01/2012 - CRT (BiV ICD) (Dr. Caryl Comes)  . CORONARY ANGIOPLASTY WITH STENT PLACEMENT   01/2006   CFX   . NM MYOCAR PERF EJECTION FRACTION  05/2010   dipyridamole myoview; mod perfusion defect in apical, basal inferior, mid inferior & apical inferior regions (infarct/scar);  post-stress 28%, low risk scan  . TONSILLECTOMY AND ADENOIDECTOMY     "when I was a child"  . TRANSTHORACIC ECHOCARDIOGRAM  01/2013   EF 40-45%, LV mildly dilated, mild LVH, mod hypokinesis of inferior myocardium, grade 1 diastolic dysfunction; mild MR    Current Outpatient Medications  Medication Sig Dispense Refill  . amiodarone (PACERONE) 200 MG tablet TAKE 1 TABLET BY MOUTH ONCE DAILY 90 tablet 2  . aspirin 81 MG tablet Take 81 mg by mouth daily.      . carvedilol (COREG) 25 MG tablet TAKE 1 TABLET BY MOUTH TWICE DAILY WITH MEALS 180 tablet 2  . clopidogrel  (PLAVIX) 75 MG tablet TAKE 1 TABLET BY MOUTH ONCE DAILY 90 tablet 2  . fish oil-omega-3 fatty acids 1000 MG capsule Take 1 g by mouth 4 (four) times a week.     . levothyroxine (SYNTHROID, LEVOTHROID) 75 MCG tablet Take 1 tablet (75 mcg total) by mouth daily before breakfast. 90 tablet 3  . Magnesium Oxide 400 MG CAPS Take 1 capsule by mouth daily.     . Multiple Vitamin (MULTIVITAMIN) tablet Take 1 tablet by mouth daily.      . ranolazine (RANEXA) 500 MG 12 hr tablet Take 1 tablet (500 mg total) by mouth 2 (two) times daily. 180 tablet 3  . rosuvastatin (CRESTOR) 20 MG tablet TAKE 1 TABLET BY MOUTH ONCE DAILY 90 tablet 0  . sacubitril-valsartan (ENTRESTO) 24-26 MG Take 1 tablet by mouth 2 (two) times daily. 60 tablet 10  . spironolactone (ALDACTONE) 25 MG tablet Take 0.5 tablets (12.5 mg total) by mouth daily. 45 tablet 3   No current facility-administered medications for this visit.     No Known Allergies  Review of Systems negative except from HPI and PMH  Physical Exam BP 112/74   Pulse 72   Ht 5\' 8"  (1.727 m)   Wt 222 lb 9.6 oz (101 kg)   SpO2 96%   BMI 33.85 kg/m  Well developed and well nourished in no acute distress HENT normal Neck supple with JVP-flat Clear Device pocket well healed; without hematoma or erythema.  There is no tethering  Regular rate and rhythm, no  gallop  murmur Abd-soft with active BS No Clubbing cyanosis   edema Skin-warm and dry A & Oriented  Grossly normal sensory and motor function     ECG P-synchronous/ AV  pacing With upright QRS V1 and neg QRS lead 1  72    Assessment and  Plan  Polymorphic ventricular tachycardia-- intercurrent nonsustained episodes  Nonischemic cardiomyopathy  Ventricular tachycardia-monomorphic-paced terminated  Coronary artery disease with prior stenting  Amiodarone  Dyslipidemia  Heart failure-chronic-mixed  CRT-St Jude from Medtronic The patient's device was interrogated.  The information was  reviewed. No changes were made in the programming.     Hypothyroidism treated  No intercurrent Ventricular tachycardia  Without symptoms of ischemia  Euvolemic continue current meds  Tolerating amio  TSH is higher and needs uptitrated synthryoid  We spent more than 50% of our >25 min visit in face to face counseling regarding the above

## 2018-09-26 NOTE — Patient Instructions (Signed)
Medication Instructions:  Your physician recommends that you continue on your current medications as directed. Please refer to the Current Medication list given to you today.  Labwork: None ordered.  Testing/Procedures: None ordered.  Follow-Up: Your physician recommends that you schedule a follow-up appointment in:   9 months with Dr Caryl Comes  Remote monitoring is used to monitor your Pacemaker of ICD from home. This monitoring reduces the number of office visits required to check your device to one time per year. It allows [place] to keep an eye on the functioning of your device to ensure it is working properly. You are scheduled for a device check from home on 6/1. You may send your transmission at any time that day. If you have a wireless device, the transmission will be sent automatically. After your physician reviews your transmission, you will receive a postcard with your next transmission date.   Any Other Special Instructions Will Be Listed Below (If Applicable).     If you need a refill on your cardiac medications before your next appointment, please call your pharmacy.

## 2018-10-05 LAB — CUP PACEART INCLINIC DEVICE CHECK
Brady Statistic RA Percent Paced: 91 %
Brady Statistic RV Percent Paced: 95 %
Date Time Interrogation Session: 20200302134900
HighPow Impedance: 67.5 Ohm
Implantable Lead Implant Date: 20130717
Implantable Lead Implant Date: 20130717
Implantable Lead Location: 753858
Implantable Lead Location: 753860
Implantable Lead Model: 4396
Implantable Lead Model: 5076
Implantable Lead Model: 6935
Implantable Pulse Generator Implant Date: 20191129
Lead Channel Impedance Value: 387.5 Ohm
Lead Channel Impedance Value: 900 Ohm
Lead Channel Pacing Threshold Amplitude: 0.75 V
Lead Channel Pacing Threshold Amplitude: 0.75 V
Lead Channel Pacing Threshold Amplitude: 1.25 V
Lead Channel Pacing Threshold Amplitude: 1.25 V
Lead Channel Pacing Threshold Pulse Width: 0.5 ms
Lead Channel Pacing Threshold Pulse Width: 0.5 ms
Lead Channel Pacing Threshold Pulse Width: 0.5 ms
Lead Channel Pacing Threshold Pulse Width: 0.5 ms
Lead Channel Sensing Intrinsic Amplitude: 3.2 mV
Lead Channel Sensing Intrinsic Amplitude: 7.2 mV
Lead Channel Setting Pacing Amplitude: 2 V
Lead Channel Setting Pacing Amplitude: 2.25 V
Lead Channel Setting Pacing Pulse Width: 0.5 ms
Lead Channel Setting Sensing Sensitivity: 0.5 mV
MDC IDC LEAD IMPLANT DT: 20130717
MDC IDC LEAD LOCATION: 753859
MDC IDC MSMT BATTERY REMAINING LONGEVITY: 68 mo
MDC IDC MSMT LEADCHNL RV IMPEDANCE VALUE: 600 Ohm
MDC IDC SET LEADCHNL LV PACING PULSEWIDTH: 0.5 ms
MDC IDC SET LEADCHNL RV PACING AMPLITUDE: 2.5 V
Pulse Gen Serial Number: 9812589

## 2018-10-21 ENCOUNTER — Other Ambulatory Visit: Payer: Self-pay | Admitting: Internal Medicine

## 2018-12-09 DIAGNOSIS — M25552 Pain in left hip: Secondary | ICD-10-CM | POA: Diagnosis not present

## 2018-12-09 DIAGNOSIS — M545 Low back pain: Secondary | ICD-10-CM | POA: Diagnosis not present

## 2018-12-09 DIAGNOSIS — M47816 Spondylosis without myelopathy or radiculopathy, lumbar region: Secondary | ICD-10-CM | POA: Diagnosis not present

## 2018-12-09 DIAGNOSIS — M1612 Unilateral primary osteoarthritis, left hip: Secondary | ICD-10-CM | POA: Diagnosis not present

## 2018-12-21 ENCOUNTER — Telehealth: Payer: Self-pay | Admitting: Student

## 2018-12-21 NOTE — Telephone Encounter (Signed)
  Received Merlin alert transmission. 32 NSVT events; 6-16 seconds long. Had 1 episode of VT for 30 secs @ 148 bpm in VT monitor zone.  Spoke with patient who has not been symptomatic. He has continued to help his son mow lawns without difficulty. Denies SOB, syncope, near syncope, palpitations, or ADL limitations.   Will forward to MD for further.

## 2018-12-21 NOTE — Telephone Encounter (Signed)
Called patient to discuss Merlin alert transmission with NSVT/VT episode(s). LMOM to call back.

## 2018-12-21 NOTE — Telephone Encounter (Signed)
Retried. No answer.

## 2018-12-21 NOTE — Telephone Encounter (Signed)
A  VTNS known  On amio and ranolazine  NTD  thx DK

## 2018-12-26 ENCOUNTER — Ambulatory Visit (INDEPENDENT_AMBULATORY_CARE_PROVIDER_SITE_OTHER): Payer: Medicare HMO | Admitting: *Deleted

## 2018-12-26 DIAGNOSIS — I472 Ventricular tachycardia: Secondary | ICD-10-CM | POA: Diagnosis not present

## 2018-12-26 DIAGNOSIS — I4729 Other ventricular tachycardia: Secondary | ICD-10-CM

## 2018-12-26 DIAGNOSIS — I428 Other cardiomyopathies: Secondary | ICD-10-CM

## 2018-12-27 LAB — CUP PACEART REMOTE DEVICE CHECK
Date Time Interrogation Session: 20200602084910
Implantable Lead Implant Date: 20130717
Implantable Lead Implant Date: 20130717
Implantable Lead Implant Date: 20130717
Implantable Lead Location: 753858
Implantable Lead Location: 753859
Implantable Lead Location: 753860
Implantable Lead Model: 4396
Implantable Lead Model: 5076
Implantable Lead Model: 6935
Implantable Pulse Generator Implant Date: 20191129
Pulse Gen Serial Number: 9812589

## 2019-01-02 NOTE — Progress Notes (Signed)
Remote ICD transmission.   

## 2019-01-03 DIAGNOSIS — M25552 Pain in left hip: Secondary | ICD-10-CM | POA: Diagnosis not present

## 2019-01-10 ENCOUNTER — Telehealth: Payer: Self-pay | Admitting: *Deleted

## 2019-01-10 DIAGNOSIS — I4729 Other ventricular tachycardia: Secondary | ICD-10-CM

## 2019-01-10 DIAGNOSIS — I472 Ventricular tachycardia: Secondary | ICD-10-CM

## 2019-01-10 NOTE — Telephone Encounter (Signed)
Attempted to reach patient to discuss ICD alert received for 4 VT-1 monitor zone episodes on 01/09/19 between 11:09-12:40. 31 NSVT episodes since 12/26/18. VT-2 (therapy zone) programmed at 169bpm. Presenting rhythm as of 6/16 at 02:00 shows Ap/BiVp rhythm. Will discuss any symptoms during episodes and confirm med list accurate.  Per phone note from 12/21/18, patient asymptomatic with NSVT and self-terminating VT episode on 12/20/18.

## 2019-01-11 NOTE — Telephone Encounter (Signed)
Spoke to pt regarding NSVT events from 01/09/19. Pt denies symptoms, states he was "probably doing nothing." All questions answered. Will route to Dr. Caryl Comes for review.

## 2019-01-11 NOTE — Telephone Encounter (Signed)
Pt returning Magazine phone call. Pt request that you call after 3 pm. He has an appointment in 10 minutes.

## 2019-01-11 NOTE — Telephone Encounter (Signed)
LMOM requesting call back to DC.

## 2019-01-11 NOTE — Telephone Encounter (Signed)
Can we have him increase his amio to 400 bid x 2 wks, then 400 qd x 2 weeks, then back to 200 daily And arrange for another transmission in abut 6 week [employer]

## 2019-01-12 MED ORDER — AMIODARONE HCL 200 MG PO TABS: ORAL_TABLET | ORAL | 0 refills | Status: DC

## 2019-01-12 NOTE — Telephone Encounter (Signed)
Instructed pt to increase his amio to 400 bid x 2 wks, then 400 qd x2 weeks, then back to 200 daily per Dr. Caryl Comes. Additional doses sent to pharmacy if needed. Pt verbalized understanding and has no further questions at this time. Will send letter with instructions on how to titrate amio. Remote transmission scheduled for 02/23/19.

## 2019-01-12 NOTE — Telephone Encounter (Signed)
LMOVM for pt to return call to DC.

## 2019-01-25 ENCOUNTER — Other Ambulatory Visit: Payer: Self-pay | Admitting: Internal Medicine

## 2019-02-22 ENCOUNTER — Other Ambulatory Visit: Payer: Self-pay | Admitting: Internal Medicine

## 2019-02-22 DIAGNOSIS — I4729 Other ventricular tachycardia: Secondary | ICD-10-CM

## 2019-02-22 DIAGNOSIS — I472 Ventricular tachycardia: Secondary | ICD-10-CM

## 2019-02-23 ENCOUNTER — Ambulatory Visit (INDEPENDENT_AMBULATORY_CARE_PROVIDER_SITE_OTHER): Payer: Medicare HMO | Admitting: *Deleted

## 2019-02-23 DIAGNOSIS — Z95810 Presence of automatic (implantable) cardiac defibrillator: Secondary | ICD-10-CM

## 2019-02-24 ENCOUNTER — Telehealth: Payer: Self-pay

## 2019-02-24 NOTE — Telephone Encounter (Signed)
Left message for patient to remind of missed remote transmission.  

## 2019-02-24 NOTE — Telephone Encounter (Signed)
Alert received for VT-1 episode. Available EGMs suggests VT, duration 52 seconds, average V rate 141bpm, denies symptoms, states he was still asleep at 0656. Pt previously had amio increased (see phone note on 01/10/19), was supposed to return to 200mg  QD. Pt states he has been taking 1 tablet in the morning and 1 tablet in the evening. Informed pt that per Dr. Olin Pia recommendations from 6/16, he needs to take 200mg  daily, now. Will forward to Dr. Caryl Comes for review of VT episode.

## 2019-02-24 NOTE — Telephone Encounter (Signed)
Follow up  Patient calling back in regards to the missed transmission. Patient sent another and wants to confirm that it was received. Please give patient a call back.

## 2019-02-25 LAB — CUP PACEART REMOTE DEVICE CHECK
Battery Remaining Longevity: 64 mo
Battery Remaining Percentage: 85 %
Battery Voltage: 3.01 V
Brady Statistic AP VP Percent: 95 %
Brady Statistic AP VS Percent: 1 %
Brady Statistic AS VP Percent: 2.9 %
Brady Statistic AS VS Percent: 1 %
Brady Statistic RA Percent Paced: 95 %
Date Time Interrogation Session: 20200731200647
HighPow Impedance: 75 Ohm
HighPow Impedance: 75 Ohm
Implantable Lead Implant Date: 20130717
Implantable Lead Implant Date: 20130717
Implantable Lead Implant Date: 20130717
Implantable Lead Location: 753858
Implantable Lead Location: 753859
Implantable Lead Location: 753860
Implantable Lead Model: 4396
Implantable Lead Model: 5076
Implantable Lead Model: 6935
Implantable Pulse Generator Implant Date: 20191129
Lead Channel Impedance Value: 360 Ohm
Lead Channel Impedance Value: 530 Ohm
Lead Channel Impedance Value: 810 Ohm
Lead Channel Pacing Threshold Amplitude: 0.75 V
Lead Channel Pacing Threshold Amplitude: 0.75 V
Lead Channel Pacing Threshold Amplitude: 1.25 V
Lead Channel Pacing Threshold Pulse Width: 0.5 ms
Lead Channel Pacing Threshold Pulse Width: 0.5 ms
Lead Channel Pacing Threshold Pulse Width: 0.5 ms
Lead Channel Sensing Intrinsic Amplitude: 1.9 mV
Lead Channel Sensing Intrinsic Amplitude: 8.3 mV
Lead Channel Setting Pacing Amplitude: 2 V
Lead Channel Setting Pacing Amplitude: 2.25 V
Lead Channel Setting Pacing Amplitude: 2.5 V
Lead Channel Setting Pacing Pulse Width: 0.5 ms
Lead Channel Setting Pacing Pulse Width: 0.5 ms
Lead Channel Setting Sensing Sensitivity: 0.5 mV
Pulse Gen Serial Number: 9812589

## 2019-02-27 ENCOUNTER — Telehealth: Payer: Self-pay | Admitting: Internal Medicine

## 2019-02-27 NOTE — Telephone Encounter (Signed)
New Message   Pt c/o medication issue:  1. Name of Medication: ENTRESTO 24-26 MG   2. How are you currently taking this medication (dosage and times per day)?   3. Are you having a reaction (difficulty breathing--STAT)?   4. What is your medication issue? Patient is calling because the cost of Delene Loll is $400 he can not afford.

## 2019-02-27 NOTE — Telephone Encounter (Signed)
I called pt's pharmacy and they stated that this does not need a P/A at this time. His out of pocket cost is $518/mth.   I called the pt and gave him the [employer] Pt Asst phone number to call and advised him to discuss his cost with them and they will let him know what help they can provide. If they send him the application I told him to fill his part out, bring it to our office with the required documentation requested and we will fill out our part and fax it in for him.   Pt understood these instructions and stated that he would call them today.

## 2019-02-27 NOTE — Telephone Encounter (Signed)
Pt having trouble affording his Entresto 24-26mg .. is coming up to 400.00 a month... he has not tried any website helps through [email or web address]... he is not comfortable with answering online questions and he has Medicare which disqualifies him from some of the services listed.   Advised him that I will forward to Jeani Hawking that helps our pts with programs to better afford them and he has enough for several days until she is back in the office 02/28/19.

## 2019-03-01 NOTE — Telephone Encounter (Signed)
M Thanks  Agree it is VT with VA dissociation He should come into the office for reprogramming of his VT-1 zone to prob 130 bpm With prolonged detections and no shocks  Lets talk about this next week Thanks DK

## 2019-03-03 ENCOUNTER — Encounter: Payer: Self-pay | Admitting: Cardiology

## 2019-03-03 NOTE — Telephone Encounter (Signed)
LMOVM for pt to call back to schedule DC appt to reprogram defib.

## 2019-03-03 NOTE — Progress Notes (Signed)
Remote ICD transmission.   

## 2019-03-03 NOTE — Addendum Note (Signed)
Addended by: Tiajuana Amass on: 03/03/2019 09:37 AM   Modules accepted: Level of Service

## 2019-03-03 NOTE — Telephone Encounter (Signed)
Spoke w/ pt and he agreed to an appt on 03/21/2019 at 8:30 AM.

## 2019-03-20 ENCOUNTER — Telehealth: Payer: Self-pay

## 2019-03-20 NOTE — Telephone Encounter (Signed)

## 2019-03-21 ENCOUNTER — Other Ambulatory Visit: Payer: Self-pay

## 2019-03-21 ENCOUNTER — Ambulatory Visit (INDEPENDENT_AMBULATORY_CARE_PROVIDER_SITE_OTHER): Payer: Medicare HMO | Admitting: *Deleted

## 2019-03-21 DIAGNOSIS — I472 Ventricular tachycardia, unspecified: Secondary | ICD-10-CM

## 2019-03-21 DIAGNOSIS — I5022 Chronic systolic (congestive) heart failure: Secondary | ICD-10-CM

## 2019-03-21 DIAGNOSIS — I428 Other cardiomyopathies: Secondary | ICD-10-CM

## 2019-03-21 LAB — CUP PACEART INCLINIC DEVICE CHECK
Battery Remaining Longevity: 60 mo
Brady Statistic RA Percent Paced: 95 %
Brady Statistic RV Percent Paced: 98 %
Date Time Interrogation Session: 20200825113854
HighPow Impedance: 70.875
Implantable Lead Implant Date: 20130717
Implantable Lead Implant Date: 20130717
Implantable Lead Implant Date: 20130717
Implantable Lead Location: 753858
Implantable Lead Location: 753859
Implantable Lead Location: 753860
Implantable Lead Model: 4396
Implantable Lead Model: 5076
Implantable Lead Model: 6935
Implantable Pulse Generator Implant Date: 20191129
Lead Channel Impedance Value: 362.5 Ohm
Lead Channel Impedance Value: 487.5 Ohm
Lead Channel Impedance Value: 825 Ohm
Lead Channel Pacing Threshold Amplitude: 0.5 V
Lead Channel Pacing Threshold Amplitude: 1 V
Lead Channel Pacing Threshold Amplitude: 1.5 V
Lead Channel Pacing Threshold Pulse Width: 0.5 ms
Lead Channel Pacing Threshold Pulse Width: 0.5 ms
Lead Channel Pacing Threshold Pulse Width: 0.5 ms
Lead Channel Sensing Intrinsic Amplitude: 3.3 mV
Lead Channel Sensing Intrinsic Amplitude: 5.7 mV
Lead Channel Setting Pacing Amplitude: 2 V
Lead Channel Setting Pacing Amplitude: 2.25 V
Lead Channel Setting Pacing Amplitude: 2.5 V
Lead Channel Setting Pacing Pulse Width: 0.5 ms
Lead Channel Setting Pacing Pulse Width: 0.5 ms
Lead Channel Setting Sensing Sensitivity: 0.5 mV
Pulse Gen Serial Number: 9812589

## 2019-03-21 NOTE — Progress Notes (Signed)
CRT-D device check in office. Thresholds and sensing consistent with previous device measurements. Lead impedance trends stable over time. Mode switch < 1 %. 4 ventricular arrhythmia episodes recorded all appear to be NSVT with the longest lasting 10  seconds.. Patient bi-ventricularly pacing 98% of the time. Device programmed with appropriate safety margins. Heart failure diagnostics reviewed and trends are stable for patient. Per SK max track rate programmed at 120 bpm, PMT detection rate programmed to 120 bpm, Vt 1 Zone programmed at 130 with ATP on and 80% burst cycle length in VT 1 cycle 2. Estimated longevity 5 yrs.  Patient enrolled in remote follow up. Plan to check device remotely 03/27/19. Patient education completed including shock plan.

## 2019-03-21 NOTE — Patient Instructions (Signed)
Contact office if unable to continue with Entresto.

## 2019-03-23 ENCOUNTER — Telehealth: Payer: Self-pay

## 2019-03-23 NOTE — Telephone Encounter (Signed)
ATP treated VT event x1: rate 137 bpm. 3 ATP bursts required for termination. 36 sec duration. Spoke to pt regarding episode, pt states he was sleeping when it occurred. Pt admits to stopping his Delene Loll, states that he cannot afford it and wishes to be put back on the medication he was taking prior. Denies missing any other medications. Informed pt of [place] driving restrictions. Pt verbalizes understanding. Will route to Dr. Caryl Comes for review.

## 2019-03-24 NOTE — Telephone Encounter (Signed)
M  thx We will follow along  It is my impression that slow VTs can often be harder to terminate ( sort of) b/c the coupling intervals are too long initially to drive the circuit to refractoriess and termination and if this doesn't make sense we can talk about this next week Thanks SK

## 2019-03-25 LAB — CUP PACEART REMOTE DEVICE CHECK
Battery Remaining Longevity: 63 mo
Battery Remaining Percentage: 84 %
Battery Voltage: 2.99 V
Brady Statistic AP VP Percent: 92 %
Brady Statistic AP VS Percent: 1 %
Brady Statistic AS VP Percent: 6.1 %
Brady Statistic AS VS Percent: 1 %
Brady Statistic RA Percent Paced: 92 %
Date Time Interrogation Session: 20200829080017
HighPow Impedance: 78 Ohm
HighPow Impedance: 78 Ohm
Implantable Lead Implant Date: 20130717
Implantable Lead Implant Date: 20130717
Implantable Lead Implant Date: 20130717
Implantable Lead Location: 753858
Implantable Lead Location: 753859
Implantable Lead Location: 753860
Implantable Lead Model: 4396
Implantable Lead Model: 5076
Implantable Lead Model: 6935
Implantable Pulse Generator Implant Date: 20191129
Lead Channel Impedance Value: 360 Ohm
Lead Channel Impedance Value: 550 Ohm
Lead Channel Impedance Value: 760 Ohm
Lead Channel Pacing Threshold Amplitude: 0.5 V
Lead Channel Pacing Threshold Amplitude: 1 V
Lead Channel Pacing Threshold Amplitude: 1.5 V
Lead Channel Pacing Threshold Pulse Width: 0.5 ms
Lead Channel Pacing Threshold Pulse Width: 0.5 ms
Lead Channel Pacing Threshold Pulse Width: 0.5 ms
Lead Channel Sensing Intrinsic Amplitude: 10 mV
Lead Channel Sensing Intrinsic Amplitude: 2.5 mV
Lead Channel Setting Pacing Amplitude: 2 V
Lead Channel Setting Pacing Amplitude: 2.25 V
Lead Channel Setting Pacing Amplitude: 2.5 V
Lead Channel Setting Pacing Pulse Width: 0.5 ms
Lead Channel Setting Pacing Pulse Width: 0.5 ms
Lead Channel Setting Sensing Sensitivity: 0.5 mV
Pulse Gen Serial Number: 9812589

## 2019-03-27 ENCOUNTER — Telehealth: Payer: Self-pay | Admitting: Emergency Medicine

## 2019-03-27 ENCOUNTER — Ambulatory Visit (INDEPENDENT_AMBULATORY_CARE_PROVIDER_SITE_OTHER): Payer: Medicare HMO | Admitting: *Deleted

## 2019-03-27 DIAGNOSIS — I428 Other cardiomyopathies: Secondary | ICD-10-CM | POA: Diagnosis not present

## 2019-03-27 LAB — CUP PACEART REMOTE DEVICE CHECK
Battery Remaining Longevity: 62 mo
Battery Remaining Longevity: 62 mo
Battery Remaining Percentage: 84 %
Battery Remaining Percentage: 84 %
Battery Voltage: 2.99 V
Battery Voltage: 2.99 V
Brady Statistic AP VP Percent: 93 %
Brady Statistic AP VP Percent: 93 %
Brady Statistic AP VS Percent: 1 %
Brady Statistic AP VS Percent: 1 %
Brady Statistic AS VP Percent: 5 %
Brady Statistic AS VP Percent: 4.3 %
Brady Statistic AS VS Percent: 1 %
Brady Statistic AS VS Percent: 1 %
Brady Statistic RA Percent Paced: 93 %
Brady Statistic RA Percent Paced: 93 %
Date Time Interrogation Session: 20200830060016
Date Time Interrogation Session: 20200831060017
HighPow Impedance: 75 Ohm
HighPow Impedance: 75 Ohm
HighPow Impedance: 72 Ohm
HighPow Impedance: 72 Ohm
Implantable Lead Implant Date: 20130717
Implantable Lead Implant Date: 20130717
Implantable Lead Implant Date: 20130717
Implantable Lead Implant Date: 20130717
Implantable Lead Implant Date: 20130717
Implantable Lead Implant Date: 20130717
Implantable Lead Location: 753858
Implantable Lead Location: 753859
Implantable Lead Location: 753860
Implantable Lead Location: 753858
Implantable Lead Location: 753859
Implantable Lead Location: 753860
Implantable Lead Model: 4396
Implantable Lead Model: 5076
Implantable Lead Model: 6935
Implantable Lead Model: 4396
Implantable Lead Model: 5076
Implantable Lead Model: 6935
Implantable Pulse Generator Implant Date: 20191129
Implantable Pulse Generator Implant Date: 20191129
Lead Channel Impedance Value: 360 Ohm
Lead Channel Impedance Value: 450 Ohm
Lead Channel Impedance Value: 700 Ohm
Lead Channel Impedance Value: 360 Ohm
Lead Channel Impedance Value: 490 Ohm
Lead Channel Impedance Value: 780 Ohm
Lead Channel Pacing Threshold Amplitude: 0.5 V
Lead Channel Pacing Threshold Amplitude: 1 V
Lead Channel Pacing Threshold Amplitude: 1.5 V
Lead Channel Pacing Threshold Amplitude: 0.5 V
Lead Channel Pacing Threshold Amplitude: 1 V
Lead Channel Pacing Threshold Amplitude: 1.5 V
Lead Channel Pacing Threshold Pulse Width: 0.5 ms
Lead Channel Pacing Threshold Pulse Width: 0.5 ms
Lead Channel Pacing Threshold Pulse Width: 0.5 ms
Lead Channel Pacing Threshold Pulse Width: 0.5 ms
Lead Channel Pacing Threshold Pulse Width: 0.5 ms
Lead Channel Pacing Threshold Pulse Width: 0.5 ms
Lead Channel Sensing Intrinsic Amplitude: 2.8 mV
Lead Channel Sensing Intrinsic Amplitude: 9.7 mV
Lead Channel Sensing Intrinsic Amplitude: 2.8 mV
Lead Channel Sensing Intrinsic Amplitude: 7.9 mV
Lead Channel Setting Pacing Amplitude: 2 V
Lead Channel Setting Pacing Amplitude: 2.25 V
Lead Channel Setting Pacing Amplitude: 2.5 V
Lead Channel Setting Pacing Amplitude: 2 V
Lead Channel Setting Pacing Amplitude: 2.25 V
Lead Channel Setting Pacing Amplitude: 2.5 V
Lead Channel Setting Pacing Pulse Width: 0.5 ms
Lead Channel Setting Pacing Pulse Width: 0.5 ms
Lead Channel Setting Pacing Pulse Width: 0.5 ms
Lead Channel Setting Pacing Pulse Width: 0.5 ms
Lead Channel Setting Sensing Sensitivity: 0.5 mV
Lead Channel Setting Sensing Sensitivity: 0.5 mV
Pulse Gen Serial Number: 9812589
Pulse Gen Serial Number: 9812589

## 2019-03-27 NOTE — Telephone Encounter (Signed)
Patient reports episodes of "light- headedness" over the past week. No CP, chest pressure, SOB, or syncope. Pt has not missed any doses of Amiodorone or Coreg. Reports only med change was that he d/ced Entresto around 03/11/19 due to cost of medication.

## 2019-03-27 NOTE — Telephone Encounter (Signed)
Spoke with patient. Pt accepted appointment on 03/28/19 at 3:45pm at [facility] with Dr. Caryl Comes. Pt verbalizes understanding of [place] DMV driving restrictions x6 months. No further questions at this time.

## 2019-03-27 NOTE — Telephone Encounter (Signed)
LMOM. Need to assess patient due to episodes of VT on 8/28 and 03/24/29.

## 2019-03-28 ENCOUNTER — Encounter: Payer: Medicare HMO | Admitting: Internal Medicine

## 2019-03-28 ENCOUNTER — Encounter: Payer: Self-pay | Admitting: Internal Medicine

## 2019-03-28 ENCOUNTER — Other Ambulatory Visit: Payer: Self-pay

## 2019-03-28 ENCOUNTER — Ambulatory Visit (INDEPENDENT_AMBULATORY_CARE_PROVIDER_SITE_OTHER): Payer: Medicare HMO | Admitting: Internal Medicine

## 2019-03-28 VITALS — BP 124/80 | HR 70 | Ht 68.0 in | Wt 217.8 lb

## 2019-03-28 DIAGNOSIS — Z449 Encounter for fitting and adjustment of unspecified external prosthetic device: Secondary | ICD-10-CM | POA: Diagnosis not present

## 2019-03-28 DIAGNOSIS — I428 Other cardiomyopathies: Secondary | ICD-10-CM | POA: Diagnosis not present

## 2019-03-28 DIAGNOSIS — I4729 Other ventricular tachycardia: Secondary | ICD-10-CM

## 2019-03-28 DIAGNOSIS — I472 Ventricular tachycardia, unspecified: Secondary | ICD-10-CM

## 2019-03-28 DIAGNOSIS — Z95810 Presence of automatic (implantable) cardiac defibrillator: Secondary | ICD-10-CM

## 2019-03-28 MED ORDER — LOSARTAN POTASSIUM 50 MG PO TABS: 50.0000 mg | ORAL_TABLET | Freq: Every day | ORAL | 3 refills | Status: DC

## 2019-03-28 NOTE — H&P (View-Only) (Signed)
Patient Care Team: Baxter Hire, MD as PCP - General (Internal Medicine)   HPI  Jerry Farley is a 73 y.o. male Seen in followup for ICD implanted to remotely for which he underwent device generator replacement 7/13. Indication was polymorphic ventricular tachycardia for which he takes amiodarone. This occurrs in the context of nonischemic cardiomyopathy. He also has a history of a 6949-lead which was revised the time of CRT upgrade receiving an atrial lead and an LV lead.   He also has coronary artery disease with prior stenting of his circumflex.  Ventricular tachycardia managed with combination  amiodarone and ranolazine   Device reached ERI and underwent generator replacement 10/19 and for reasons that I do not recall received a St Jude replacement of a Medtronic device  He has had significant VT of late.  He has noted over the last 3-6 months increasing exercise intolerance.  This is been unassociated with dyspnea or chest discomfort.  He does not recall his symptoms and review of the catheterization report from 2007 notes only nonsustained ventricular tachycardia.  He has prior stenting as noted below as well as a prior infarct by Myoview scanning.   DATE TEST    2007 LHC 25% Cx Stent   2011 myoview   EF 29 % Inferoapical scar  7/14 Echo    EF 40-45% % LAE mild  12/16 Echo  Ef 40-45%       Date Cr K TSH LFTs PFTs  11/16    0.48 28    6/17    1.6 30    8/18   4.96 (on synthroid) 30   12/18 1.1 4.8 2.19 33   8/19 1.0 4.6 3.06 32    1/20 1.1 4.4 5.95 30            Patient denies symptoms of GI intolerance, sun sensitivity, neurological symptoms attributable to amiodarone.        Past Medical History:  Diagnosis Date  . 6948-lead   . Arthritis    "maybe a little"  . CAD (coronary artery disease) July 2007   CFX stent (patent Sept '07)  . Cardiac defibrillator CRT-medtronic 01/26/2011  . Chronic systolic congestive heart failure ([place])   . Complication  of anesthesia    "problems waking up w/valium"  . High cholesterol   . Hypothyroidism   . ICD (implantable cardiac defibrillator) in place 2006, 2013   Medtronic Protecta XT-DR CRT (01/2012)  . NICM (nonischemic cardiomyopathy) ([place])    ejection fraction 40-45% by echo July 2012  . Non-ischemic cardiomyopathy- EF 40-45% 2D 7/14 02/11/2012  . Obesity (BMI 30-39.9) 08/22/2013  . Ventricular tachycardia, non-sustained ([place]) 2007   Amiodarone    Past Surgical History:  Procedure Laterality Date  . BI-VENTRICULAR IMPLANTABLE CARDIOVERTER DEFIBRILLATOR UPGRADE N/A 02/10/2012   Procedure: BI-VENTRICULAR IMPLANTABLE CARDIOVERTER DEFIBRILLATOR UPGRADE;  Surgeon: Deboraha Sprang, MD;  Location: [facility] CATH LAB;  Service: Cardiovascular;  Laterality: N/A;  . BIV ICD GENERATOR CHANGEOUT N/A 06/24/2018   Procedure: BIV ICD GENERATOR CHANGEOUT;  Surgeon: Deboraha Sprang, MD;  Location: [place] CV LAB;  Service: Cardiovascular;  Laterality: N/A;  . CARDIAC CATHETERIZATION  11/30/2001   non-ischemic cardiomyopahty with high LV end-diastolic pressure; [id number] distal L main stenosis, large osital stenosis 70%, 70% Cfx stenosis (Dr. Domenic Moras)  . CARDIAC CATHETERIZATION  9/07   CFX stent patent [employer])  . CARDIAC DEFIBRILLATOR PLACEMENT  09/2004; 02/10/12   ICD implanted in 2006 (Dr. Rito Ehrlich);  EOL generator change in 01/2012 - CRT (BiV ICD) (Dr. Caryl Comes)  . CORONARY ANGIOPLASTY WITH STENT PLACEMENT   01/2006   CFX   . NM MYOCAR PERF EJECTION FRACTION  05/2010   dipyridamole myoview; mod perfusion defect in apical, basal inferior, mid inferior & apical inferior regions (infarct/scar); post-stress 28%, low risk scan  . TONSILLECTOMY AND ADENOIDECTOMY     "when I was a child"  . TRANSTHORACIC ECHOCARDIOGRAM  01/2013   EF 40-45%, LV mildly dilated, mild LVH, mod hypokinesis of inferior myocardium, grade 1 diastolic dysfunction; mild MR    Current Outpatient Medications  Medication Sig Dispense Refill  . amiodarone  (PACERONE) 200 MG tablet Take 1 tablet (200 mg total) by mouth daily. 90 tablet 3  . aspirin 81 MG tablet Take 81 mg by mouth daily.      . carvedilol (COREG) 25 MG tablet TAKE 1 TABLET BY MOUTH TWICE DAILY WITH MEALS 180 tablet 2  . clopidogrel (PLAVIX) 75 MG tablet TAKE 1 TABLET BY MOUTH ONCE DAILY 90 tablet 2  . ENTRESTO 24-26 MG Take 1 tablet by mouth twice daily 180 tablet 2  . fish oil-omega-3 fatty acids 1000 MG capsule Take 1 g by mouth 4 (four) times a week.     . levothyroxine (SYNTHROID) 75 MCG tablet TAKE 1 TABLET BY MOUTH DAILY BEFORE BREAKFAST. PLEASE CALL MD OFFICE FOR AN APPOINTMENT 90 tablet 2  . Magnesium Oxide 400 MG CAPS Take 1 capsule by mouth daily.     . Multiple Vitamin (MULTIVITAMIN) tablet Take 1 tablet by mouth daily.      . ranolazine (RANEXA) 500 MG 12 hr tablet Take 1 tablet by mouth twice daily 180 tablet 3  . rosuvastatin (CRESTOR) 20 MG tablet Take 1 tablet by mouth once daily 90 tablet 3  . spironolactone (ALDACTONE) 25 MG tablet Take 1/2 (one-half) tablet by mouth once daily 45 tablet 3   No current facility-administered medications for this visit.     No Known Allergies  Review of Systems negative except from HPI and PMH  Physical Exam BP 124/80   Pulse 70   Ht 5\' 8"  (1.727 m)   Wt 217 lb 12.8 oz (98.8 kg)   SpO2 98%   BMI 33.12 kg/m  Well developed and well nourished in no acute distress HENT normal Neck supple with JVP-7 Clear Device pocket well healed; without hematoma or erythema.  There is no tethering  Regular rate and rhythm, no murmur Abd-soft with active BS No Clubbing cyanosis   edema Skin-warm and dry A & Oriented  Grossly normal sensory and motor function  ECG AV pacing 70 13/2050 rS lead 1 qR lead V1 Unchanged 2019   Assessment and  Plan  Polymorphic ventricular tachycardia-- intercurrent nonsustained episodes  Nonischemic cardiomyopathy  Ventricular tachycardia-monomorphic-paced terminated  Coronary artery disease  with prior stenting  Amiodarone  Dyslipidemia  Heart failure-chronic-mixed  CRT-St Jude from Medtronic The patient's device was interrogated.  The information was reviewed. No changes were made in the programming.     Hypothyroidism treated     Significant intercurrent ventricular tachycardia slow below detection as well as vascular.  Has been treated successfully with ATP.  Not clear what is the cause of the significant increase in burden.  More recently he has run out of his Entresto.  Over the long-term, changes in LV function and no progressive coronary disease need to be excluded.  We have discussed catheterization versus Myoview scanning; with his high pretest probability  with known stenting, would favor the former.  He will discuss this with his wife.  We reviewed risks and benefits.  In the interim, we will increase his amiodarone to 400 twice daily.  Need to check amiodarone surveillance laboratories  He has stopped his Entresto secondary to cost.  We will put him on losartan 50 mg daily.  Euvolemic continue current meds although his fatigue may well be a manifestation of heart failure.  More than 50% of 40 min was spent in counseling related to the above

## 2019-03-28 NOTE — Progress Notes (Signed)
Patient Care Team: Baxter Hire, MD as PCP - General (Internal Medicine)   HPI  Jerry Farley is a 73 y.o. male Seen in followup for ICD implanted to remotely for which he underwent device generator replacement 7/13. Indication was polymorphic ventricular tachycardia for which he takes amiodarone. This occurrs in the context of nonischemic cardiomyopathy. He also has a history of a 6949-lead which was revised the time of CRT upgrade receiving an atrial lead and an LV lead.   He also has coronary artery disease with prior stenting of his circumflex.  Ventricular tachycardia managed with combination  amiodarone and ranolazine   Device reached ERI and underwent generator replacement 10/19 and for reasons that I do not recall received a St Jude replacement of a Medtronic device  He has had significant VT of late.  He has noted over the last 3-6 months increasing exercise intolerance.  This is been unassociated with dyspnea or chest discomfort.  He does not recall his symptoms and review of the catheterization report from 2007 notes only nonsustained ventricular tachycardia.  He has prior stenting as noted below as well as a prior infarct by Myoview scanning.   DATE TEST    2007 LHC 25% Cx Stent   2011 myoview   EF 29 % Inferoapical scar  7/14 Echo    EF 40-45% % LAE mild  12/16 Echo  Ef 40-45%       Date Cr K TSH LFTs PFTs  11/16    0.48 28    6/17    1.6 30    8/18   4.96 (on synthroid) 30   12/18 1.1 4.8 2.19 33   8/19 1.0 4.6 3.06 32    1/20 1.1 4.4 5.95 30            Patient denies symptoms of GI intolerance, sun sensitivity, neurological symptoms attributable to amiodarone.        Past Medical History:  Diagnosis Date  . 6948-lead   . Arthritis    "maybe a little"  . CAD (coronary artery disease) July 2007   CFX stent (patent Sept '07)  . Cardiac defibrillator CRT-medtronic 01/26/2011  . Chronic systolic congestive heart failure ([place])   . Complication  of anesthesia    "problems waking up w/valium"  . High cholesterol   . Hypothyroidism   . ICD (implantable cardiac defibrillator) in place 2006, 2013   Medtronic Protecta XT-DR CRT (01/2012)  . NICM (nonischemic cardiomyopathy) ([place])    ejection fraction 40-45% by echo July 2012  . Non-ischemic cardiomyopathy- EF 40-45% 2D 7/14 02/11/2012  . Obesity (BMI 30-39.9) 08/22/2013  . Ventricular tachycardia, non-sustained ([place]) 2007   Amiodarone    Past Surgical History:  Procedure Laterality Date  . BI-VENTRICULAR IMPLANTABLE CARDIOVERTER DEFIBRILLATOR UPGRADE N/A 02/10/2012   Procedure: BI-VENTRICULAR IMPLANTABLE CARDIOVERTER DEFIBRILLATOR UPGRADE;  Surgeon: Deboraha Sprang, MD;  Location: [facility] CATH LAB;  Service: Cardiovascular;  Laterality: N/A;  . BIV ICD GENERATOR CHANGEOUT N/A 06/24/2018   Procedure: BIV ICD GENERATOR CHANGEOUT;  Surgeon: Deboraha Sprang, MD;  Location: [place] CV LAB;  Service: Cardiovascular;  Laterality: N/A;  . CARDIAC CATHETERIZATION  11/30/2001   non-ischemic cardiomyopahty with high LV end-diastolic pressure; A999333 distal L main stenosis, large osital stenosis 70%, 70% Cfx stenosis (Dr. Domenic Moras)  . CARDIAC CATHETERIZATION  9/07   CFX stent patent [employer])  . CARDIAC DEFIBRILLATOR PLACEMENT  09/2004; 02/10/12   ICD implanted in 2006 (Dr. Rito Ehrlich);  EOL generator change in 01/2012 - CRT (BiV ICD) (Dr. Caryl Comes)  . CORONARY ANGIOPLASTY WITH STENT PLACEMENT   01/2006   CFX   . NM MYOCAR PERF EJECTION FRACTION  05/2010   dipyridamole myoview; mod perfusion defect in apical, basal inferior, mid inferior & apical inferior regions (infarct/scar); post-stress 28%, low risk scan  . TONSILLECTOMY AND ADENOIDECTOMY     "when I was a child"  . TRANSTHORACIC ECHOCARDIOGRAM  01/2013   EF 40-45%, LV mildly dilated, mild LVH, mod hypokinesis of inferior myocardium, grade 1 diastolic dysfunction; mild MR    Current Outpatient Medications  Medication Sig Dispense Refill  . amiodarone  (PACERONE) 200 MG tablet Take 1 tablet (200 mg total) by mouth daily. 90 tablet 3  . aspirin 81 MG tablet Take 81 mg by mouth daily.      . carvedilol (COREG) 25 MG tablet TAKE 1 TABLET BY MOUTH TWICE DAILY WITH MEALS 180 tablet 2  . clopidogrel (PLAVIX) 75 MG tablet TAKE 1 TABLET BY MOUTH ONCE DAILY 90 tablet 2  . ENTRESTO 24-26 MG Take 1 tablet by mouth twice daily 180 tablet 2  . fish oil-omega-3 fatty acids 1000 MG capsule Take 1 g by mouth 4 (four) times a week.     . levothyroxine (SYNTHROID) 75 MCG tablet TAKE 1 TABLET BY MOUTH DAILY BEFORE BREAKFAST. PLEASE CALL MD OFFICE FOR AN APPOINTMENT 90 tablet 2  . Magnesium Oxide 400 MG CAPS Take 1 capsule by mouth daily.     . Multiple Vitamin (MULTIVITAMIN) tablet Take 1 tablet by mouth daily.      . ranolazine (RANEXA) 500 MG 12 hr tablet Take 1 tablet by mouth twice daily 180 tablet 3  . rosuvastatin (CRESTOR) 20 MG tablet Take 1 tablet by mouth once daily 90 tablet 3  . spironolactone (ALDACTONE) 25 MG tablet Take 1/2 (one-half) tablet by mouth once daily 45 tablet 3   No current facility-administered medications for this visit.     No Known Allergies  Review of Systems negative except from HPI and PMH  Physical Exam BP 124/80   Pulse 70   Ht 5\' 8"  (1.727 m)   Wt 217 lb 12.8 oz (98.8 kg)   SpO2 98%   BMI 33.12 kg/m  Well developed and well nourished in no acute distress HENT normal Neck supple with JVP-7 Clear Device pocket well healed; without hematoma or erythema.  There is no tethering  Regular rate and rhythm, no murmur Abd-soft with active BS No Clubbing cyanosis   edema Skin-warm and dry A & Oriented  Grossly normal sensory and motor function  ECG AV pacing 70 13/2050 rS lead 1 qR lead V1 Unchanged 2019   Assessment and  Plan  Polymorphic ventricular tachycardia-- intercurrent nonsustained episodes  Nonischemic cardiomyopathy  Ventricular tachycardia-monomorphic-paced terminated  Coronary artery disease  with prior stenting  Amiodarone  Dyslipidemia  Heart failure-chronic-mixed  CRT-St Jude from Medtronic The patient's device was interrogated.  The information was reviewed. No changes were made in the programming.     Hypothyroidism treated     Significant intercurrent ventricular tachycardia slow below detection as well as vascular.  Has been treated successfully with ATP.  Not clear what is the cause of the significant increase in burden.  More recently he has run out of his Entresto.  Over the long-term, changes in LV function and no progressive coronary disease need to be excluded.  We have discussed catheterization versus Myoview scanning; with his high pretest probability  with known stenting, would favor the former.  He will discuss this with his wife.  We reviewed risks and benefits.  In the interim, we will increase his amiodarone to 400 twice daily.  Need to check amiodarone surveillance laboratories  He has stopped his Entresto secondary to cost.  We will put him on losartan 50 mg daily.  Euvolemic continue current meds although his fatigue may well be a manifestation of heart failure.  More than 50% of 40 min was spent in counseling related to the above

## 2019-03-28 NOTE — Patient Instructions (Signed)
Medication Instructions:  Your physician has recommended you make the following change in your medication:   1. Begin Losartan, 50mg  tablet, once daily. 2. Increase your Amiodarone to 400mg , 2 tablets, two times per day. You will take this for two weeks.  After 2 weeks, decrease your Amiodarone back down to 200mg , one tablet, once daily  Labwork: You will have labs drawn today: CMP, TSH  Testing/Procedures: None ordered.  Follow-Up: Your physician recommends that you schedule a follow-up appointment in:   3 months with Dr. Caryl Comes  Please call us when you have decided on cardiac catheterization.   Any Other Special Instructions Will Be Listed Below (If Applicable).     If you need a refill on your cardiac medications before your next appointment, please call your pharmacy.

## 2019-03-29 ENCOUNTER — Other Ambulatory Visit: Payer: Self-pay

## 2019-03-29 ENCOUNTER — Emergency Department: Payer: Medicare HMO

## 2019-03-29 ENCOUNTER — Emergency Department
Admission: EM | Admit: 2019-03-29 | Discharge: 2019-03-29 | Disposition: A | Discharge status: Home / Self Care | Payer: Medicare HMO | Attending: Emergency Medicine | Admitting: Emergency Medicine

## 2019-03-29 DIAGNOSIS — W01198A Fall on same level from slipping, tripping and stumbling with subsequent striking against other object, initial encounter: Secondary | ICD-10-CM | POA: Diagnosis not present

## 2019-03-29 DIAGNOSIS — E785 Hyperlipidemia, unspecified: Secondary | ICD-10-CM | POA: Insufficient documentation

## 2019-03-29 DIAGNOSIS — S0512XA Contusion of eyeball and orbital tissues, left eye, initial encounter: Secondary | ICD-10-CM | POA: Diagnosis not present

## 2019-03-29 DIAGNOSIS — S0121XA Laceration without foreign body of nose, initial encounter: Secondary | ICD-10-CM | POA: Diagnosis not present

## 2019-03-29 DIAGNOSIS — Y999 Unspecified external cause status: Secondary | ICD-10-CM | POA: Insufficient documentation

## 2019-03-29 DIAGNOSIS — Y9389 Activity, other specified: Secondary | ICD-10-CM | POA: Diagnosis not present

## 2019-03-29 DIAGNOSIS — Y92007 Garden or yard of unspecified non-institutional (private) residence as the place of occurrence of the external cause: Secondary | ICD-10-CM | POA: Insufficient documentation

## 2019-03-29 DIAGNOSIS — S0182XA Laceration with foreign body of other part of head, initial encounter: Secondary | ICD-10-CM | POA: Insufficient documentation

## 2019-03-29 DIAGNOSIS — R220 Localized swelling, mass and lump, head: Secondary | ICD-10-CM | POA: Diagnosis not present

## 2019-03-29 DIAGNOSIS — S0990XA Unspecified injury of head, initial encounter: Secondary | ICD-10-CM | POA: Diagnosis not present

## 2019-03-29 DIAGNOSIS — S01112A Laceration without foreign body of left eyelid and periocular area, initial encounter: Secondary | ICD-10-CM | POA: Diagnosis not present

## 2019-03-29 DIAGNOSIS — I5022 Chronic systolic (congestive) heart failure: Secondary | ICD-10-CM | POA: Insufficient documentation

## 2019-03-29 DIAGNOSIS — Z23 Encounter for immunization: Secondary | ICD-10-CM | POA: Insufficient documentation

## 2019-03-29 DIAGNOSIS — Z7982 Long term (current) use of aspirin: Secondary | ICD-10-CM | POA: Insufficient documentation

## 2019-03-29 DIAGNOSIS — I2510 Atherosclerotic heart disease of native coronary artery without angina pectoris: Secondary | ICD-10-CM | POA: Insufficient documentation

## 2019-03-29 DIAGNOSIS — Z87891 Personal history of nicotine dependence: Secondary | ICD-10-CM | POA: Insufficient documentation

## 2019-03-29 DIAGNOSIS — E039 Hypothyroidism, unspecified: Secondary | ICD-10-CM | POA: Insufficient documentation

## 2019-03-29 DIAGNOSIS — W19XXXA Unspecified fall, initial encounter: Secondary | ICD-10-CM

## 2019-03-29 DIAGNOSIS — R51 Headache: Secondary | ICD-10-CM | POA: Insufficient documentation

## 2019-03-29 DIAGNOSIS — S022XXA Fracture of nasal bones, initial encounter for closed fracture: Secondary | ICD-10-CM | POA: Diagnosis not present

## 2019-03-29 LAB — COMPREHENSIVE METABOLIC PANEL
ALT: 41 IU/L (ref 0–44)
AST: 30 IU/L (ref 0–40)
Albumin/Globulin Ratio: 2.1 (ref 1.2–2.2)
Albumin: 4.4 g/dL (ref 3.7–4.7)
Alkaline Phosphatase: 40 IU/L (ref 39–117)
BUN/Creatinine Ratio: 11 (ref 10–24)
BUN: 12 mg/dL (ref 8–27)
Bilirubin Total: 0.5 mg/dL (ref 0.0–1.2)
CO2: 26 mmol/L (ref 20–29)
Calcium: 9.3 mg/dL (ref 8.6–10.2)
Chloride: 102 mmol/L (ref 96–106)
Creatinine, Ser: 1.14 mg/dL (ref 0.76–1.27)
GFR calc Af Amer: 73 mL/min/{1.73_m2} (ref >59)
GFR calc non Af Amer: 63 mL/min/{1.73_m2} (ref >59)
Globulin, Total: 2.1 g/dL (ref 1.5–4.5)
Glucose: 93 mg/dL (ref 65–99)
Potassium: 4.4 mmol/L (ref 3.5–5.2)
Sodium: 143 mmol/L (ref 134–144)
Total Protein: 6.5 g/dL (ref 6.0–8.5)

## 2019-03-29 LAB — TSH: TSH: 6.75 u[IU]/mL — ABNORMAL HIGH (ref 0.450–4.500)

## 2019-03-29 MED ORDER — TETANUS-DIPHTHERIA TOXOIDS TD 5-2 LFU IM INJ
0.5000 mL | INJECTION | Freq: Once | INTRAMUSCULAR | Status: AC
  Administered 2019-03-29: 0.5 mL via INTRAMUSCULAR
  Filled 2019-03-29: qty 0.5

## 2019-03-29 NOTE — ED Notes (Signed)
Pt reports that he fell off the lawn mower today and hit his head, denies loss of consciousness. Has laceration above left eye. Reports taking anticoagulants. Denies any other injuries

## 2019-03-29 NOTE — Discharge Instructions (Addendum)
Please call the number provided tomorrow morning 8 AM to arrange an eye appointment for tomorrow.  Return to the emergency department for any worsening vision, or any other symptom personally concerning to yourself.

## 2019-03-29 NOTE — ED Notes (Signed)
Pt wishes not to wait the 15 minutes after IM shot.

## 2019-03-29 NOTE — ED Provider Notes (Addendum)
[facility] Emergency Department Provider Note  Time seen: 6:49 PM  I have reviewed the triage vital signs and the nursing notes.   HISTORY  Chief Complaint Fall   HPI Jerry Farley is a 73 y.o. male with a past medical history of CAD, CHF, hyperlipidemia, presents to the emergency department after a fall.  According to the patient he tripped fell forward hitting his lawn mower.  Denies LOC.  Patient has swelling and bruising with 2 lacerations to his left face/left eye.  Denies any fever cough congestion or shortness of breath.  Denies any pain other than his left face, but states minimal/mild discomfort to his left face.   Past Medical History:  Diagnosis Date  . 6948-lead   . Arthritis    "maybe a little"  . CAD (coronary artery disease) July 2007   CFX stent (patent Sept '07)  . Cardiac defibrillator CRT-medtronic 01/26/2011  . Chronic systolic congestive heart failure ([place])   . Complication of anesthesia    "problems waking up w/valium"  . High cholesterol   . Hypothyroidism   . ICD (implantable cardiac defibrillator) in place 2006, 2013   Medtronic Protecta XT-DR CRT (01/2012)  . NICM (nonischemic cardiomyopathy) ([place])    ejection fraction 40-45% by echo July 2012  . Non-ischemic cardiomyopathy- EF 40-45% 2D 7/14 02/11/2012  . Obesity (BMI 30-39.9) 08/22/2013  . Ventricular tachycardia, non-sustained ([place]) 2007   Amiodarone    Patient Active Problem List   Diagnosis Date Noted  . Obesity (BMI 30-39.9) 08/22/2013  . Non-ischemic cardiomyopathy- EF 40-45% 2D 7/14 02/11/2012  . Ventricular tachycardia-polymorphic 01/26/2011  . Cardiac defibrillator CRT-medtronic 01/26/2011  . CAD (coronary artery disease)   . Chronic systolic congestive heart failure [facility])     Past Surgical History:  Procedure Laterality Date  . BI-VENTRICULAR IMPLANTABLE CARDIOVERTER DEFIBRILLATOR UPGRADE N/A 02/10/2012   Procedure: BI-VENTRICULAR IMPLANTABLE CARDIOVERTER  DEFIBRILLATOR UPGRADE;  Surgeon: Deboraha Sprang, MD;  Location: [facility] CATH LAB;  Service: Cardiovascular;  Laterality: N/A;  . BIV ICD GENERATOR CHANGEOUT N/A 06/24/2018   Procedure: BIV ICD GENERATOR CHANGEOUT;  Surgeon: Deboraha Sprang, MD;  Location: [place] CV LAB;  Service: Cardiovascular;  Laterality: N/A;  . CARDIAC CATHETERIZATION  11/30/2001   non-ischemic cardiomyopahty with high LV end-diastolic pressure; A999333 distal L main stenosis, large osital stenosis 70%, 70% Cfx stenosis (Dr. Domenic Moras)  . CARDIAC CATHETERIZATION  9/07   CFX stent patent [employer])  . CARDIAC DEFIBRILLATOR PLACEMENT  09/2004; 02/10/12   ICD implanted in 2006 (Dr. Rito Ehrlich); EOL generator change in 01/2012 - CRT (BiV ICD) (Dr. Caryl Comes)  . CORONARY ANGIOPLASTY WITH STENT PLACEMENT   01/2006   CFX   . NM MYOCAR PERF EJECTION FRACTION  05/2010   dipyridamole myoview; mod perfusion defect in apical, basal inferior, mid inferior & apical inferior regions (infarct/scar); post-stress 28%, low risk scan  . TONSILLECTOMY AND ADENOIDECTOMY     "when I was a child"  . TRANSTHORACIC ECHOCARDIOGRAM  01/2013   EF 40-45%, LV mildly dilated, mild LVH, mod hypokinesis of inferior myocardium, grade 1 diastolic dysfunction; mild MR    Prior to Admission medications   Medication Sig Start Date End Date Taking? Authorizing Provider  amiodarone (PACERONE) 200 MG tablet Take 1 tablet (200 mg total) by mouth daily. 02/23/19   Deboraha Sprang, MD  aspirin 81 MG tablet Take 81 mg by mouth daily.      [provider]  carvedilol (COREG) 25 MG tablet TAKE  1 TABLET BY MOUTH TWICE DAILY WITH MEALS 08/24/18   Deboraha Sprang, MD  clopidogrel (PLAVIX) 75 MG tablet TAKE 1 TABLET BY MOUTH ONCE DAILY 08/24/18   Deboraha Sprang, MD  fish oil-omega-3 fatty acids 1000 MG capsule Take 1 g by mouth 4 (four) times a week.     [provider]  levothyroxine (SYNTHROID) 75 MCG tablet TAKE 1 TABLET BY MOUTH DAILY BEFORE BREAKFAST. PLEASE CALL MD  OFFICE FOR AN APPOINTMENT 01/25/19   Deboraha Sprang, MD  losartan (COZAAR) 50 MG tablet Take 1 tablet (50 mg total) by mouth daily. 03/28/19 06/26/19  Deboraha Sprang, MD  Magnesium Oxide 400 MG CAPS Take 1 capsule by mouth daily.     [provider]  Multiple Vitamin (MULTIVITAMIN) tablet Take 1 tablet by mouth daily.      [provider]  ranolazine (RANEXA) 500 MG 12 hr tablet Take 1 tablet by mouth twice daily 10/21/18   Deboraha Sprang, MD  rosuvastatin (CRESTOR) 20 MG tablet Take 1 tablet by mouth once daily 10/21/18   Deboraha Sprang, MD  spironolactone (ALDACTONE) 25 MG tablet Take 1/2 (one-half) tablet by mouth once daily 10/21/18   Deboraha Sprang, MD    No Known Allergies  Family History  Problem Relation Age of Onset  . Cancer Mother   . Cancer Brother     Social History Social History   Tobacco Use  . Smoking status: Former Smoker    Packs/day: 1.50    Years: 42.00    Pack years: 63.00    Types: Cigarettes    Quit date: 07/28/2003    Years since quitting: 15.6  . Smokeless tobacco: Former Systems developer    Types: Chew  . Tobacco comment: "didn't chew but a year or 2; don't know when I quit"  Substance Use Topics  . Alcohol use: No  . Drug use: No    Review of Systems Constitutional: Negative for fever. Eyes: Left eye is swollen shut, blurred/double vision when opened. ENT: Negative for recent illness/congestion Cardiovascular: Negative for chest pain. Respiratory: Negative for shortness of breath. Gastrointestinal: Negative for abdominal pain Musculoskeletal: Negative for musculoskeletal complaints Neurological: Negative for headache All other ROS negative  ____________________________________________   PHYSICAL EXAM:  VITAL SIGNS: ED Triage Vitals  Enc Vitals Group     BP 03/29/19 1705 129/74     Pulse Rate 03/29/19 1705 70     Resp 03/29/19 1705 16     Temp 03/29/19 1705 98 F (36.7 C)     Temp Source 03/29/19 1705 Oral     SpO2 03/29/19  1705 98 %     Weight 03/29/19 1707 217 lb (98.4 kg)     Height 03/29/19 1707 5\' 8"  (1.727 m)     Head Circumference --      Peak Flow --      Pain Score 03/29/19 1706 0     Pain Loc --      Pain Edu? --      Excl. in [place]? --    Constitutional: Alert and oriented. Well appearing and in no distress. Eyes: Left eye is swollen shut, when manually open patient has a dilated pupil to approximately 4 mm compared to 2 mm on the right pupil.  On extraocular testing patient appears to have inhibited downward gaze of the left eye and describes double vision.  Patient also states blurred vision from the eye.  Patient also has conjunctival injection/hemorrhage ENT  Head: Patient has a small approximately 0.5 cm laceration just left of the bridge of the nose, hemostatic and non-gaping.  Patient has an approximate 1.5 to 2 cm laceration above the left eyebrow, hemostatic and non-gaping.  Periorbital ecchymosis around the left eye.      Mouth/Throat: Mucous membranes are moist. Cardiovascular: Normal rate, regular rhythm. Respiratory: Normal respiratory effort without tachypnea nor retractions. Breath sounds are clear  Gastrointestinal: Soft and nontender. No distention.   Musculoskeletal: Nontender with normal range of motion in all extremities. Neurologic:  Normal speech and language. No gross focal neurologic deficits Skin:  Skin is warm.  2 lacerations as described above. Psychiatric: Mood and affect are normal.   ____________________________________________   RADIOLOGY  IMPRESSION:  1. No acute intracranial abnormality.  2. Left periorbital soft tissue swelling without evidence for an  underlying fracture.   ____________________________________________   INITIAL IMPRESSION / ASSESSMENT AND PLAN / ED COURSE  Pertinent labs & imaging results that were available during my care of the patient were reviewed by me and considered in my medical decision making (see chart for details).    Patient presents to the emerged as left face.  Patient has 2 small lacerations 1 to the bridge of nose 1 to the forehead both of which are hemostatic and non-gaping would likely be able to repair with adhesive.  On examination however patient left eye is swollen shut, when manually opened his pupils dilated to approximately 4 mm with a 2 mm right pupil.  Extraocular exam appears limited with downward gaze of the left eye and patient describes double vision.  Patient CT scan of his head is negative however CT did not include the face we will obtain CT maxillofacial to further evaluate.  We will call ophthalmology to further discuss..  CT maxillofacial is negative besides soft tissue edema.  Discussed the patient Dr. Wallace Going of ophthalmology who will see the patient tomorrow morning in the office.  Patient already states his vision is improving, suspect possible traumatic iritis.  Small lacerations repaired with Dermabond.  We will update the patient's tetanus  ROLEN OSE was evaluated in Emergency Department on 03/29/2019 for the symptoms described in the history of present illness. He was evaluated in the context of the global COVID-19 pandemic, which necessitated consideration that the patient might be at risk for infection with the SARS-CoV-2 virus that causes COVID-19. Institutional protocols and algorithms that pertain to the evaluation of patients at risk for COVID-19 are in a state of rapid change based on information released by regulatory bodies including the CDC and federal and state organizations. These policies and algorithms were followed during the patient's care in the ED.  LACERATION REPAIR Performed by: [facility] Authorized by: [facility] Consent: Verbal consent obtained. Risks and benefits: risks, benefits and alternatives were discussed Consent given by: patient Patient identity confirmed: provided demographic data Prepped and Draped in normal sterile  fashion Wound explored  Laceration Location: Left eyebrow  Laceration Length: 1.5 cm  No Foreign Bodies seen or palpated  Irrigation method: syringe Amount of cleaning: standard  Skin closure: Dermabond  Patient tolerance: Patient tolerated the procedure well with no immediate complications.   ____________________________________________   FINAL CLINICAL IMPRESSION(S) / ED DIAGNOSES  Cheral Marker, MD 03/29/19 Morene Rankins, MD 04/12/19 [phone]

## 2019-03-29 NOTE — ED Triage Notes (Signed)
Pt states he tripped and fell getting on the lawn mower about 2 hrs ago and hit his head, denies LOC, pt has 2 lacs medial to left eye and above the left eye, controlled bleeding  At this time. Orbital swelling noted

## 2019-03-29 NOTE — ED Notes (Signed)
Pt refusing vital signs at time of D/C.

## 2019-03-30 ENCOUNTER — Telehealth: Payer: Self-pay | Admitting: Internal Medicine

## 2019-03-30 DIAGNOSIS — Z01812 Encounter for preprocedural laboratory examination: Secondary | ICD-10-CM

## 2019-03-30 DIAGNOSIS — R079 Chest pain, unspecified: Secondary | ICD-10-CM

## 2019-03-30 DIAGNOSIS — S0512XA Contusion of eyeball and orbital tissues, left eye, initial encounter: Secondary | ICD-10-CM | POA: Diagnosis not present

## 2019-03-30 DIAGNOSIS — I2510 Atherosclerotic heart disease of native coronary artery without angina pectoris: Secondary | ICD-10-CM

## 2019-03-30 NOTE — Telephone Encounter (Signed)
  Patient is calling because he is taking amiodarone (PACERONE) 200 MG tablet and it was discussed at his visit to up the dosage. He needs clarification on how much he needs to be taking.

## 2019-03-30 NOTE — Telephone Encounter (Signed)
Dr. Caryl Comes, Please clarify- would you like the pt to have a L cardiac cath or a LR cardiac cath?  What is the diagnosis?  Thanks

## 2019-03-30 NOTE — Telephone Encounter (Signed)
Confirmed pts dose of Amiodarone has increased to 400mg  BID according to Dr. Aquilla Hacker most recent note from his 9/1 appt  Pt would like to make Dr. Caryl Comes aware that he did fall yesterday and hurt his eye badly. He is following up with his eye doctor.   Pt is also wondering when his cardiac cath should be scheduled

## 2019-03-30 NOTE — Telephone Encounter (Signed)
Could we schedule for Jerry Farley, [place] or [place] soon but not urgent  plz Thanks

## 2019-03-31 ENCOUNTER — Encounter: Payer: Self-pay | Admitting: *Deleted

## 2019-03-31 NOTE — Telephone Encounter (Signed)
Per Dr Caryl Comes, this is for a Left Heart Cath dx of CP.

## 2019-03-31 NOTE — Telephone Encounter (Addendum)
I spoke with pt. He had a recent fall and is seeing eye doctor the afternoon of September 8,2020. He would like to do cath on Thursday or Friday.  Cath scheduled for September 17,2020 at 9:00 with Dr. Irish Lack. I verbally went over all instructions with pt and will have copy mailed to him.  Pt will come to our office on September 14,2020 at 11:30 for lab work.  He will then go to [facility] for covid testing at 12:10.  Pt aware he must have lab work done prior to testing and that he will have to quarantine after testing.

## 2019-04-04 ENCOUNTER — Encounter: Payer: Self-pay | Admitting: Cardiology

## 2019-04-04 DIAGNOSIS — H532 Diplopia: Secondary | ICD-10-CM | POA: Diagnosis not present

## 2019-04-04 NOTE — Progress Notes (Signed)
Remote ICD transmission.   

## 2019-04-10 ENCOUNTER — Other Ambulatory Visit: Payer: Self-pay

## 2019-04-10 ENCOUNTER — Other Ambulatory Visit (HOSPITAL_COMMUNITY)
Admission: RE | Admit: 2019-04-10 | Discharge: 2019-04-10 | Disposition: A | Discharge status: Home / Self Care | Payer: Medicare HMO | Source: Ambulatory Visit | Attending: Interventional Cardiology | Admitting: Interventional Cardiology

## 2019-04-10 ENCOUNTER — Other Ambulatory Visit: Payer: Medicare HMO | Admitting: *Deleted

## 2019-04-10 DIAGNOSIS — Z01812 Encounter for preprocedural laboratory examination: Secondary | ICD-10-CM | POA: Insufficient documentation

## 2019-04-10 DIAGNOSIS — I2510 Atherosclerotic heart disease of native coronary artery without angina pectoris: Secondary | ICD-10-CM

## 2019-04-10 DIAGNOSIS — Z20828 Contact with and (suspected) exposure to other viral communicable diseases: Secondary | ICD-10-CM | POA: Diagnosis not present

## 2019-04-10 DIAGNOSIS — R079 Chest pain, unspecified: Secondary | ICD-10-CM

## 2019-04-10 DIAGNOSIS — R0789 Other chest pain: Secondary | ICD-10-CM | POA: Diagnosis not present

## 2019-04-10 LAB — BASIC METABOLIC PANEL
BUN/Creatinine Ratio: 10 (ref 10–24)
BUN: 12 mg/dL (ref 8–27)
CO2: 24 mmol/L (ref 20–29)
Calcium: 9.5 mg/dL (ref 8.6–10.2)
Chloride: 103 mmol/L (ref 96–106)
Creatinine, Ser: 1.21 mg/dL (ref 0.76–1.27)
GFR calc Af Amer: 68 mL/min/{1.73_m2} (ref >59)
GFR calc non Af Amer: 59 mL/min/{1.73_m2} — ABNORMAL LOW (ref >59)
Glucose: 130 mg/dL — ABNORMAL HIGH (ref 65–99)
Potassium: 4.3 mmol/L (ref 3.5–5.2)
Sodium: 140 mmol/L (ref 134–144)

## 2019-04-10 LAB — CBC
Hematocrit: 40.3 % (ref 37.5–51.0)
Hemoglobin: 13.4 g/dL (ref 13.0–17.7)
MCH: 33.3 pg — ABNORMAL HIGH (ref 26.6–33.0)
MCHC: 33.3 g/dL (ref 31.5–35.7)
MCV: 100 fL — ABNORMAL HIGH (ref 79–97)
Platelets: 185 10*3/uL (ref 150–450)
RBC: 4.02 x10E6/uL — ABNORMAL LOW (ref 4.14–5.80)
RDW: 12.7 % (ref 11.6–15.4)
WBC: 8 10*3/uL (ref 3.4–10.8)

## 2019-04-11 LAB — NOVEL CORONAVIRUS, NAA (HOSP ORDER, SEND-OUT TO REF LAB; TAT 18-24 HRS): SARS-CoV-2, NAA: NOT DETECTED

## 2019-04-12 ENCOUNTER — Telehealth: Payer: Self-pay | Admitting: *Deleted

## 2019-04-12 MED ORDER — PREDNISONE 50 MG PO TABS: ORAL_TABLET | ORAL | 0 refills | Status: DC

## 2019-04-12 NOTE — Telephone Encounter (Signed)
Patient called back after speaking with his wife and reports that he does have an intolerance to contrast dye. Patient remembers having testing a few years ago with Dr. Megan Salon using contrast and "it did not go well." Patient could not elaborate any further but insisted that he had a bad reaction to it. Added contrast media to patient's allergy list.   Instructed patient on contrast allergy protocol prior to his catheterization. Patient is agreeable and verbalized understanding to taking prednisone 50 mg at 8:00 pm tonight, again at 2 am, and he will take prednisone 50 mg along with benadryl 50 mg right before leaving home tomorrow morning. Prescription for prednisone 50 mg sent to [facility] in [facility] as requested and he will pick up over the counter benadryl there as well. No further questions.

## 2019-04-12 NOTE — Telephone Encounter (Signed)
Pt contacted pre-catheterization scheduled at [facility] for: Left heart catheterization with Dr. Irish Lack on 04/13/2019. Verified arrival time and place: [facility]) at: 7:00 am.   No solid food after midnight prior to cath, clear liquids until 5 AM day of procedure. Contrast allergy: None Verified no diabetes medications.  AM meds can be  taken pre-cath with sip of water including: ASA 81 mg Plavix 75 mg  Patient advised to hold losartan and spironolactone today and tomorrow due to GFR of 59.  Confirmed patient has responsible person to drive home post procedure and observe 24 hours after arriving home: yes   Currently, due to Covid-19 pandemic, only one support person will be allowed with patient. Must be the same support person for that patient's entire stay, will be screened and required to wear a mask. They will be asked to wait in the waiting room for the duration of the patient's stay.  Patients are required to wear a mask when they enter the hospital.       COVID-19 Pre-Screening Questions:  . In the past 7 to 10 days have you had a cough,  shortness of breath, headache, congestion, fever (100 or greater) body aches, chills, sore throat, or sudden loss of taste or sense of smell? No . Have you been around anyone with known Covid 19? No . Have you been around anyone who is awaiting Covid 19 test results in the past 7 to 10 days? No . Have you been around anyone who has been exposed to Covid 19, or has mentioned symptoms of Covid 19 within the past 7 to 10 days? No

## 2019-04-12 NOTE — Addendum Note (Signed)
Addended by: Austin Miles on: 04/12/2019 09:33 AM   Modules accepted: Orders

## 2019-04-13 ENCOUNTER — Encounter (HOSPITAL_COMMUNITY)
Admission: RE | Disposition: A | Discharge status: Home / Self Care | Payer: Medicare HMO | Source: Home / Self Care | Attending: Interventional Cardiology

## 2019-04-13 ENCOUNTER — Encounter (HOSPITAL_COMMUNITY): Payer: Self-pay | Admitting: Interventional Cardiology

## 2019-04-13 ENCOUNTER — Ambulatory Visit (HOSPITAL_COMMUNITY)
Admission: RE | Admit: 2019-04-13 | Discharge: 2019-04-13 | Disposition: A | Discharge status: Home / Self Care | Payer: Medicare HMO | Attending: Interventional Cardiology | Admitting: Interventional Cardiology

## 2019-04-13 ENCOUNTER — Other Ambulatory Visit: Payer: Self-pay

## 2019-04-13 DIAGNOSIS — Z79890 Hormone replacement therapy (postmenopausal): Secondary | ICD-10-CM | POA: Diagnosis not present

## 2019-04-13 DIAGNOSIS — Z7982 Long term (current) use of aspirin: Secondary | ICD-10-CM | POA: Diagnosis not present

## 2019-04-13 DIAGNOSIS — E785 Hyperlipidemia, unspecified: Secondary | ICD-10-CM | POA: Insufficient documentation

## 2019-04-13 DIAGNOSIS — I2510 Atherosclerotic heart disease of native coronary artery without angina pectoris: Secondary | ICD-10-CM | POA: Insufficient documentation

## 2019-04-13 DIAGNOSIS — Z79899 Other long term (current) drug therapy: Secondary | ICD-10-CM | POA: Diagnosis not present

## 2019-04-13 DIAGNOSIS — I2584 Coronary atherosclerosis due to calcified coronary lesion: Secondary | ICD-10-CM | POA: Diagnosis not present

## 2019-04-13 DIAGNOSIS — Z7902 Long term (current) use of antithrombotics/antiplatelets: Secondary | ICD-10-CM | POA: Diagnosis not present

## 2019-04-13 DIAGNOSIS — E039 Hypothyroidism, unspecified: Secondary | ICD-10-CM | POA: Insufficient documentation

## 2019-04-13 DIAGNOSIS — I5022 Chronic systolic (congestive) heart failure: Secondary | ICD-10-CM | POA: Diagnosis not present

## 2019-04-13 DIAGNOSIS — I428 Other cardiomyopathies: Secondary | ICD-10-CM | POA: Insufficient documentation

## 2019-04-13 DIAGNOSIS — M1990 Unspecified osteoarthritis, unspecified site: Secondary | ICD-10-CM | POA: Diagnosis not present

## 2019-04-13 DIAGNOSIS — I472 Ventricular tachycardia: Secondary | ICD-10-CM | POA: Diagnosis not present

## 2019-04-13 DIAGNOSIS — E669 Obesity, unspecified: Secondary | ICD-10-CM | POA: Insufficient documentation

## 2019-04-13 DIAGNOSIS — Z6833 Body mass index (BMI) 33.0-33.9, adult: Secondary | ICD-10-CM | POA: Diagnosis not present

## 2019-04-13 DIAGNOSIS — E7800 Pure hypercholesterolemia, unspecified: Secondary | ICD-10-CM | POA: Insufficient documentation

## 2019-04-13 HISTORY — PX: LEFT HEART CATH AND CORONARY ANGIOGRAPHY: CATH118249

## 2019-04-13 SURGERY — LEFT HEART CATH AND CORONARY ANGIOGRAPHY
Anesthesia: LOCAL

## 2019-04-13 MED ORDER — SODIUM CHLORIDE 0.9 % WEIGHT BASED INFUSION: 1.0000 mL/kg/h | INTRAVENOUS | Status: DC

## 2019-04-13 MED ORDER — LIDOCAINE HCL (PF) 1 % IJ SOLN
INTRAMUSCULAR | Status: AC
  Filled 2019-04-13: qty 30

## 2019-04-13 MED ORDER — SODIUM CHLORIDE 0.9 % IV SOLN: 250.0000 mL | INTRAVENOUS | Status: DC | PRN

## 2019-04-13 MED ORDER — VERAPAMIL HCL 2.5 MG/ML IV SOLN
INTRAVENOUS | Status: AC
  Filled 2019-04-13: qty 2

## 2019-04-13 MED ORDER — HEPARIN SODIUM (PORCINE) 1000 UNIT/ML IJ SOLN
INTRAMUSCULAR | Status: AC
  Filled 2019-04-13: qty 1

## 2019-04-13 MED ORDER — ONDANSETRON HCL 4 MG/2ML IJ SOLN: 4.0000 mg | Freq: Four times a day (QID) | INTRAMUSCULAR | Status: DC | PRN

## 2019-04-13 MED ORDER — SODIUM CHLORIDE 0.9% FLUSH: 3.0000 mL | INTRAVENOUS | Status: DC | PRN

## 2019-04-13 MED ORDER — ASPIRIN 81 MG PO CHEW: 81.0000 mg | CHEWABLE_TABLET | ORAL | Status: DC

## 2019-04-13 MED ORDER — IOHEXOL 350 MG/ML SOLN
INTRAVENOUS | Status: DC | PRN
  Administered 2019-04-13: 08:00:00 80 mL via INTRA_ARTERIAL

## 2019-04-13 MED ORDER — SODIUM CHLORIDE 0.9% FLUSH: 3.0000 mL | Freq: Two times a day (BID) | INTRAVENOUS | Status: DC

## 2019-04-13 MED ORDER — FENTANYL CITRATE (PF) 100 MCG/2ML IJ SOLN
INTRAMUSCULAR | Status: DC | PRN
  Administered 2019-04-13: 25 ug via INTRAVENOUS

## 2019-04-13 MED ORDER — MIDAZOLAM HCL 2 MG/2ML IJ SOLN
INTRAMUSCULAR | Status: AC
  Filled 2019-04-13: qty 2

## 2019-04-13 MED ORDER — HYDRALAZINE HCL 20 MG/ML IJ SOLN: 10.0000 mg | INTRAMUSCULAR | Status: DC | PRN

## 2019-04-13 MED ORDER — HEPARIN SODIUM (PORCINE) 1000 UNIT/ML IJ SOLN
INTRAMUSCULAR | Status: DC | PRN
  Administered 2019-04-13: 5000 [IU] via INTRAVENOUS

## 2019-04-13 MED ORDER — VERAPAMIL HCL 2.5 MG/ML IV SOLN
INTRAVENOUS | Status: DC | PRN
  Administered 2019-04-13: 10 mL via INTRA_ARTERIAL

## 2019-04-13 MED ORDER — SODIUM CHLORIDE 0.9 % WEIGHT BASED INFUSION
3.0000 mL/kg/h | INTRAVENOUS | Status: AC
  Administered 2019-04-13: 07:00:00 3 mL/kg/h via INTRAVENOUS

## 2019-04-13 MED ORDER — ACETAMINOPHEN 325 MG PO TABS: 650.0000 mg | ORAL_TABLET | ORAL | Status: DC | PRN

## 2019-04-13 MED ORDER — MIDAZOLAM HCL 2 MG/2ML IJ SOLN
INTRAMUSCULAR | Status: DC | PRN
  Administered 2019-04-13: 2 mg via INTRAVENOUS

## 2019-04-13 MED ORDER — HEPARIN (PORCINE) IN NACL 1000-0.9 UT/500ML-% IV SOLN
INTRAVENOUS | Status: AC
  Filled 2019-04-13: qty 1000

## 2019-04-13 MED ORDER — LIDOCAINE HCL (PF) 1 % IJ SOLN
INTRAMUSCULAR | Status: DC | PRN
  Administered 2019-04-13: 2 mL via INTRADERMAL

## 2019-04-13 MED ORDER — FENTANYL CITRATE (PF) 100 MCG/2ML IJ SOLN
INTRAMUSCULAR | Status: AC
  Filled 2019-04-13: qty 2

## 2019-04-13 MED ORDER — LABETALOL HCL 5 MG/ML IV SOLN: 10.0000 mg | INTRAVENOUS | Status: DC | PRN

## 2019-04-13 MED ORDER — HEPARIN (PORCINE) IN NACL 1000-0.9 UT/500ML-% IV SOLN
INTRAVENOUS | Status: DC | PRN
  Administered 2019-04-13 (×2): 500 mL

## 2019-04-13 SURGICAL SUPPLY — 11 items
CATH 5FR JL3.5 JR4 ANG PIG MP (CATHETERS) ×1 IMPLANT
DEVICE RAD COMP TR BAND LRG (VASCULAR PRODUCTS) ×1 IMPLANT
GLIDESHEATH SLEND SS 6F .021 (SHEATH) ×1 IMPLANT
GUIDEWIRE INQWIRE 1.5J.035X260 (WIRE) IMPLANT
INQWIRE 1.5J .035X260CM (WIRE) ×2
KIT HEART LEFT (KITS) ×2 IMPLANT
PACK CARDIAC CATHETERIZATION (CUSTOM PROCEDURE TRAY) ×2 IMPLANT
SHEATH PROBE COVER 6X72 (BAG) ×1 IMPLANT
SYR MEDRAD MARK 7 150ML (SYRINGE) ×2 IMPLANT
TRANSDUCER W/STOPCOCK (MISCELLANEOUS) ×2 IMPLANT
TUBING CIL FLEX 10 FLL-RA (TUBING) ×2 IMPLANT

## 2019-04-13 NOTE — Interval H&P Note (Signed)
Cath Lab Visit (complete for each Cath Lab visit)  Clinical Evaluation Leading to the Procedure:   ACS: No.  Non-ACS:    Anginal Classification: CCS II  Anti-ischemic medical therapy: Maximal Therapy (2 or more classes of medications)  Non-Invasive Test Results: High-risk stress test findings: cardiac mortality >3%/year NSVT noted on device check  Prior CABG: No previous CABG      History and Physical Interval Note:  04/13/2019 7:38 AM  [name]  has presented today for surgery, with the diagnosis of Chest pain.  The various methods of treatment have been discussed with the patient and family. After consideration of risks, benefits and other options for treatment, the patient has consented to  Procedure(s): LEFT HEART CATH AND CORONARY ANGIOGRAPHY (N/A) as a surgical intervention.  The patient's history has been reviewed, patient examined, no change in status, stable for surgery.  I have reviewed the patient's chart and labs.  Questions were answered to the patient's satisfaction.     Larae Grooms

## 2019-04-13 NOTE — Discharge Instructions (Signed)
Radial Site Care ° °This sheet gives you information about how to care for yourself after your procedure. Your health care provider may also give you more specific instructions. If you have problems or questions, contact your health care provider. °What can I expect after the procedure? °After the procedure, it is common to have: °· Bruising and tenderness at the catheter insertion area. °Follow these instructions at home: °Medicines °· Take over-the-counter and prescription medicines only as told by your health care provider. °Insertion site care °· Follow instructions from your health care provider about how to take care of your insertion site. Make sure you: °? Wash your hands with soap and water before you change your bandage (dressing). If soap and water are not available, use hand sanitizer. °? Change your dressing as told by your health care provider. °? Leave stitches (sutures), skin glue, or adhesive strips in place. These skin closures may need to stay in place for 2 weeks or longer. If adhesive strip edges start to loosen and curl up, you may trim the loose edges. Do not remove adhesive strips completely unless your health care provider tells you to do that. °· Check your insertion site every day for signs of infection. Check for: °? Redness, swelling, or pain. °? Fluid or blood. °? Pus or a bad smell. °? Warmth. °· Do not take baths, swim, or use a hot tub until your health care provider approves. °· You may shower 24-48 hours after the procedure, or as directed by your health care provider. °? Remove the dressing and gently wash the site with plain soap and water. °? Pat the area dry with a clean towel. °? Do not rub the site. That could cause bleeding. °· Do not apply powder or lotion to the site. °Activity ° °· For 24 hours after the procedure, or as directed by your health care provider: °? Do not flex or bend the affected arm. °? Do not push or pull heavy objects with the affected arm. °? Do not  drive yourself home from the hospital or clinic. You may drive 24 hours after the procedure unless your health care provider tells you not to. °? Do not operate machinery or power tools. °· Do not lift anything that is heavier than 10 lb (4.5 kg), or the limit that you are told, until your health care provider says that it is safe. °· Ask your health care provider when it is okay to: °? Return to work or school. °? Resume usual physical activities or sports. °? Resume sexual activity. °General instructions °· If the catheter site starts to bleed, raise your arm and put firm pressure on the site. If the bleeding does not stop, get help right away. This is a medical emergency. °· If you went home on the same day as your procedure, a responsible adult should be with you for the first 24 hours after you arrive home. °· Keep all follow-up visits as told by your health care provider. This is important. °Contact a health care provider if: °· You have a fever. °· You have redness, swelling, or yellow drainage around your insertion site. °Get help right away if: °· You have unusual pain at the radial site. °· The catheter insertion area swells very fast. °· The insertion area is bleeding, and the bleeding does not stop when you hold steady pressure on the area. °· Your arm or hand becomes pale, cool, tingly, or numb. °These symptoms may represent a serious problem   that is an emergency. Do not wait to see if the symptoms will go away. Get medical help right away. Call your local emergency services (911 in the [place] Do not drive yourself to the hospital. °Summary °· After the procedure, it is common to have bruising and tenderness at the site. °· Follow instructions from your health care provider about how to take care of your radial site wound. Check the wound every day for signs of infection. °· Do not lift anything that is heavier than 10 lb (4.5 kg), or the limit that you are told, until your health care provider says  that it is safe. °This information is not intended to replace advice given to you by your health care provider. Make sure you discuss any questions you have with your health care provider. °Document Released: 08/15/2010 Document Revised: 08/18/2017 Document Reviewed: 08/18/2017 °Elsevier Patient Education © 2020 [employer] ° °

## 2019-04-14 ENCOUNTER — Telehealth: Payer: Self-pay | Admitting: *Deleted

## 2019-04-14 DIAGNOSIS — I472 Ventricular tachycardia, unspecified: Secondary | ICD-10-CM

## 2019-04-14 MED ORDER — LEVOTHYROXINE SODIUM 88 MCG PO TABS: 88.0000 ug | ORAL_TABLET | Freq: Every day | ORAL | 11 refills | Status: DC

## 2019-04-14 NOTE — Telephone Encounter (Signed)
Both the pt and his wife have been notified of lab results by phone with verbal understanding. I have gone over the recommendations to increase synthroid to 88 mcg with TSH to be done in 4 weeks 05/12/19. Both the pt and his wife have been advised to have pt f/u with PCP in regards to CBC results. I will forward results to PCP Dr. Harrel Lemon. I have sent in a new Rx for the Synthroid. Both the pt and his wife thanked me for the call. Patient notified of result.  Please refer to phone note from today for complete details.   Julaine Hua, [facility] 04/14/2019 3:18 PM

## 2019-04-14 NOTE — Telephone Encounter (Signed)
-----   Message from Deboraha Sprang, MD sent at 04/12/2019 10:35 AM EDT ----- Please Inform Patient  Labs are normal x minimal changes in Red blood cell indicies   Will ask his PCP to followthis along-- near the normal range probably means nothing   Thanks

## 2019-04-27 ENCOUNTER — Other Ambulatory Visit: Payer: Self-pay | Admitting: Ophthalmology

## 2019-04-27 ENCOUNTER — Other Ambulatory Visit: Payer: Self-pay | Admitting: Nurse Practitioner

## 2019-04-27 DIAGNOSIS — H532 Diplopia: Secondary | ICD-10-CM

## 2019-04-27 DIAGNOSIS — H538 Other visual disturbances: Secondary | ICD-10-CM | POA: Diagnosis not present

## 2019-04-28 ENCOUNTER — Telehealth: Payer: Self-pay

## 2019-04-28 NOTE — Telephone Encounter (Signed)
   [facility] Pre-operative Risk Assessment    Request for surgical clearance:  1. What type of surgery is being performed? MRI/MRA brain- doing this instead of CTA due to patient having allergy to IV contrast  2. When is this surgery scheduled? 05/09/19  3. What type of clearance is required (medical clearance vs. Pharmacy clearance to hold med vs. Both)? Procedure Clearance due to Pacemaker/ Defibrillator - Can he under go MRI with this device?  4. Are there any medications that need to be held prior to surgery and how long? NO   5. Practice name and name of physician performing surgery? [facility], Dr. Leandrew Koyanagi  6. What is your office phone number [phone]   7.   What is your office fax number [phone]  8.   Anesthesia type (None, local, MAC, general) ? No   Jerry Farley 04/28/2019, 4:42 PM  _______________________________________________

## 2019-05-01 NOTE — Telephone Encounter (Signed)
This is not MRI compatible device - so unable to do MRI.

## 2019-05-05 ENCOUNTER — Telehealth: Payer: Self-pay

## 2019-05-05 NOTE — Telephone Encounter (Signed)
**Note De-Identified Ayeisha Lindenberger Obfuscation** A [employer] pt asst application was left in my basket completely filled out including ICD 10 codes and Dr Aquilla Hacker signature. The medication listed on the application is for Ranolazine but [employer] pt asst does not support Ranolazine and [employer] (the maker of Ranexa) recommends that patients contact [facility] at [phone] for asst with cost.  I did call the pt but he stated he is unaware of a application being sent in for him. I will call back to discuss with his wife on Monday.

## 2019-05-09 ENCOUNTER — Ambulatory Visit
Admission: RE | Admit: 2019-05-09 | Discharge: 2019-05-09 | Disposition: A | Discharge status: Home / Self Care | Payer: Medicare HMO | Source: Ambulatory Visit | Attending: Ophthalmology | Admitting: Ophthalmology

## 2019-05-09 ENCOUNTER — Other Ambulatory Visit: Payer: Self-pay

## 2019-05-09 DIAGNOSIS — H532 Diplopia: Secondary | ICD-10-CM

## 2019-05-09 DIAGNOSIS — S0512XA Contusion of eyeball and orbital tissues, left eye, initial encounter: Secondary | ICD-10-CM | POA: Diagnosis not present

## 2019-05-09 DIAGNOSIS — S0990XA Unspecified injury of head, initial encounter: Secondary | ICD-10-CM | POA: Diagnosis not present

## 2019-05-09 HISTORY — DX: Essential (primary) hypertension: I10

## 2019-05-09 MED ORDER — IOHEXOL 350 MG/ML SOLN
75.0000 mL | Freq: Once | INTRAVENOUS | Status: AC | PRN
  Administered 2019-05-09: 75 mL via INTRAVENOUS

## 2019-05-12 ENCOUNTER — Other Ambulatory Visit: Payer: Self-pay

## 2019-05-12 ENCOUNTER — Other Ambulatory Visit: Payer: Medicare HMO | Admitting: *Deleted

## 2019-05-12 DIAGNOSIS — Z79899 Other long term (current) drug therapy: Secondary | ICD-10-CM | POA: Diagnosis not present

## 2019-05-12 DIAGNOSIS — R7989 Other specified abnormal findings of blood chemistry: Secondary | ICD-10-CM | POA: Diagnosis not present

## 2019-05-12 DIAGNOSIS — I472 Ventricular tachycardia, unspecified: Secondary | ICD-10-CM

## 2019-05-12 LAB — TSH: TSH: 4.22 u[IU]/mL (ref 0.450–4.500)

## 2019-05-23 ENCOUNTER — Other Ambulatory Visit: Payer: Self-pay | Admitting: Internal Medicine

## 2019-06-16 DIAGNOSIS — S0412XD Injury of oculomotor nerve, left side, subsequent encounter: Secondary | ICD-10-CM | POA: Diagnosis not present

## 2019-06-26 ENCOUNTER — Ambulatory Visit (INDEPENDENT_AMBULATORY_CARE_PROVIDER_SITE_OTHER): Payer: Medicare HMO | Admitting: *Deleted

## 2019-06-26 DIAGNOSIS — I5022 Chronic systolic (congestive) heart failure: Secondary | ICD-10-CM | POA: Diagnosis not present

## 2019-06-26 LAB — CUP PACEART REMOTE DEVICE CHECK
Battery Remaining Longevity: 60 mo
Battery Remaining Percentage: 80 %
Battery Voltage: 2.98 V
Brady Statistic AP VP Percent: 97 %
Brady Statistic AP VS Percent: 1.1 %
Brady Statistic AS VP Percent: 1.2 %
Brady Statistic AS VS Percent: 1 %
Brady Statistic RA Percent Paced: 97 %
Date Time Interrogation Session: 20201130020017
HighPow Impedance: 74 Ohm
HighPow Impedance: 74 Ohm
Implantable Lead Implant Date: 20130717
Implantable Lead Implant Date: 20130717
Implantable Lead Implant Date: 20130717
Implantable Lead Location: 753858
Implantable Lead Location: 753859
Implantable Lead Location: 753860
Implantable Lead Model: 4396
Implantable Lead Model: 5076
Implantable Lead Model: 6935
Implantable Pulse Generator Implant Date: 20191129
Lead Channel Impedance Value: 380 Ohm
Lead Channel Impedance Value: 460 Ohm
Lead Channel Impedance Value: 780 Ohm
Lead Channel Pacing Threshold Amplitude: 0.5 V
Lead Channel Pacing Threshold Amplitude: 1 V
Lead Channel Pacing Threshold Amplitude: 1 V
Lead Channel Pacing Threshold Pulse Width: 0.5 ms
Lead Channel Pacing Threshold Pulse Width: 0.5 ms
Lead Channel Pacing Threshold Pulse Width: 0.5 ms
Lead Channel Sensing Intrinsic Amplitude: 2.1 mV
Lead Channel Sensing Intrinsic Amplitude: 7.8 mV
Lead Channel Setting Pacing Amplitude: 2 V
Lead Channel Setting Pacing Amplitude: 2.25 V
Lead Channel Setting Pacing Amplitude: 2.5 V
Lead Channel Setting Pacing Pulse Width: 0.5 ms
Lead Channel Setting Pacing Pulse Width: 0.5 ms
Lead Channel Setting Sensing Sensitivity: 0.5 mV
Pulse Gen Serial Number: 9812589

## 2019-06-27 ENCOUNTER — Encounter: Payer: Medicare HMO | Admitting: Internal Medicine

## 2019-07-04 ENCOUNTER — Ambulatory Visit: Payer: Medicare HMO | Admitting: Internal Medicine

## 2019-07-04 ENCOUNTER — Other Ambulatory Visit: Payer: Self-pay

## 2019-07-04 ENCOUNTER — Encounter: Payer: Self-pay | Admitting: Internal Medicine

## 2019-07-04 VITALS — BP 118/74 | HR 70 | Ht 67.0 in | Wt 218.8 lb

## 2019-07-04 DIAGNOSIS — I472 Ventricular tachycardia, unspecified: Secondary | ICD-10-CM

## 2019-07-04 DIAGNOSIS — Z95810 Presence of automatic (implantable) cardiac defibrillator: Secondary | ICD-10-CM

## 2019-07-04 DIAGNOSIS — I428 Other cardiomyopathies: Secondary | ICD-10-CM

## 2019-07-04 NOTE — Progress Notes (Signed)
Patient Care Team: Baxter Hire, MD as PCP - General (Internal Medicine)   HPI  Jerry Farley is a 73 y.o. male Seen in followup for ICD implanted to remotely for which he underwent device generator replacement 7/13. Indication was polymorphic ventricular tachycardia for which he takes amiodarone. This occurrs in the context of nonischemic cardiomyopathy. He also has a history of a 6949-lead which was revised the time of CRT upgrade receiving an atrial lead and an LV lead.   He also has coronary artery disease with prior stenting of his circumflex.  Ventricular tachycardia managed with combination  amiodarone and ranolazine   Device reached ERI and underwent generator replacement 10/19 and for reasons that I do not recall received a St Jude replacement of a Medtronic device  He has had significant VT of late.  He has noted over the last 3-6 months increasing exercise intolerance.  This is been unassociated with dyspnea or chest discomfort.  He does not recall his symptoms and review of the catheterization report from 2007 notes only nonsustained ventricular tachycardia.  He has prior stenting as noted below as well as a prior infarct by Myoview scanning.   DATE TEST EF   2007 LHC 25% Cx Stent   2011 myoview   29 % Inferoapical scar  7/14 Echo    40-45% % LAE mild  12/16 Echo   40-45%   9/19 LHC 25-35% Cx stent patent O/w nonobstructive       Date Cr K TSH LFTs PFTs  11/16    0.48 28    6/17    1.6 30    8/18   4.96 (on synthroid) 30   12/18 1.1 4.8 2.19 33   8/19 1.0 4.6 3.06 32    1/20 1.1 4.4 5.95 30   9/20 1.21 4.3 4.22 41     The patient denies chest pain,  nocturnal dyspnea, orthopnea or peripheral edema.  There have been no palpitations, lightheadedness.  Moderate dyspnea on exertion  Had a syncopal event getting off his tractor a hot afternoon in September; residual fatigue and LH   Patient denies symptoms of GI intolerance, sun sensitivity,  neurological symptoms attributable to amiodarone.  Surveillance laboratories were in normal limits when checked 3 months  ago     Past Medical History:  Diagnosis Date  . 6948-lead   . Arthritis    "maybe a little"  . CAD (coronary artery disease) July 2007   CFX stent (patent Sept '07)  . Cardiac defibrillator CRT-medtronic 01/26/2011  . Chronic systolic congestive heart failure ([place])   . Complication of anesthesia    "problems waking up w/valium"  . High cholesterol   . Hypertension   . Hypothyroidism   . ICD (implantable cardiac defibrillator) in place 2006, 2013   Medtronic Protecta XT-DR CRT (01/2012)  . NICM (nonischemic cardiomyopathy) ([place])    ejection fraction 40-45% by echo July 2012  . Non-ischemic cardiomyopathy- EF 40-45% 2D 7/14 02/11/2012  . Obesity (BMI 30-39.9) 08/22/2013  . Ventricular tachycardia, non-sustained ([place]) 2007   Amiodarone    Past Surgical History:  Procedure Laterality Date  . BI-VENTRICULAR IMPLANTABLE CARDIOVERTER DEFIBRILLATOR UPGRADE N/A 02/10/2012   Procedure: BI-VENTRICULAR IMPLANTABLE CARDIOVERTER DEFIBRILLATOR UPGRADE;  Surgeon: Deboraha Sprang, MD;  Location[facility] CATH LAB;  Service: Cardiovascular;  Laterality: N/A;  . BIV ICD GENERATOR CHANGEOUT N/A 06/24/2018   Procedure: BIV ICD GENERATOR CHANGEOUT;  Surgeon: Deboraha Sprang, MD;  Location: [place]  CV LAB;  Service: Cardiovascular;  Laterality: N/A;  . CARDIAC CATHETERIZATION  11/30/2001   non-ischemic cardiomyopahty with high LV end-diastolic pressure; A999333 distal L main stenosis, large osital stenosis 70%, 70% Cfx stenosis (Dr. Domenic Moras)  . CARDIAC CATHETERIZATION  9/07   CFX stent patent [employer])  . CARDIAC DEFIBRILLATOR PLACEMENT  09/2004; 02/10/12   ICD implanted in 2006 (Dr. Rito Ehrlich); EOL generator change in 01/2012 - CRT (BiV ICD) (Dr. Caryl Comes)  . CORONARY ANGIOPLASTY WITH STENT PLACEMENT   01/2006   CFX   . LEFT HEART CATH AND CORONARY ANGIOGRAPHY N/A 04/13/2019   Procedure: LEFT  HEART CATH AND CORONARY ANGIOGRAPHY;  Surgeon: Jettie Booze, MD;  Location: [place] CV LAB;  Service: Cardiovascular;  Laterality: N/A;  . NM MYOCAR PERF EJECTION FRACTION  05/2010   dipyridamole myoview; mod perfusion defect in apical, basal inferior, mid inferior & apical inferior regions (infarct/scar); post-stress 28%, low risk scan  . TONSILLECTOMY AND ADENOIDECTOMY     "when I was a child"  . TRANSTHORACIC ECHOCARDIOGRAM  01/2013   EF 40-45%, LV mildly dilated, mild LVH, mod hypokinesis of inferior myocardium, grade 1 diastolic dysfunction; mild MR    Current Outpatient Medications  Medication Sig Dispense Refill  . amiodarone (PACERONE) 200 MG tablet Take 1 tablet (200 mg total) by mouth daily. 90 tablet 3  . aspirin 81 MG tablet Take 81 mg by mouth daily.      . carvedilol (COREG) 25 MG tablet TAKE 1 TABLET BY MOUTH TWICE DAILY WITH MEALS 180 tablet 3  . clopidogrel (PLAVIX) 75 MG tablet Take 1 tablet by mouth once daily 90 tablet 3  . fish oil-omega-3 fatty acids 1000 MG capsule Take 1 g by mouth daily.     Marland Kitchen levothyroxine (SYNTHROID) 88 MCG tablet Take 1 tablet (88 mcg total) by mouth daily before breakfast. 30 tablet 11  . Magnesium Oxide 400 MG CAPS Take 400 mg by mouth daily.     . Multiple Vitamin (MULTIVITAMIN) tablet Take 1 tablet by mouth daily.      Marland Kitchen oxymetazoline (AFRIN) 0.05 % nasal spray Place 1 spray into both nostrils 2 (two) times daily as needed for congestion.    . ranolazine (RANEXA) 500 MG 12 hr tablet Take 500 mg by mouth 2 (two) times daily.    . rosuvastatin (CRESTOR) 20 MG tablet Take 20 mg by mouth daily.    Marland Kitchen spironolactone (ALDACTONE) 25 MG tablet Take 12.5 mg by mouth daily.    Marland Kitchen losartan (COZAAR) 50 MG tablet Take 1 tablet (50 mg total) by mouth daily. 90 tablet 3   No current facility-administered medications for this visit.     Allergies  Allergen Reactions  . Contrast Media [Iodinated Diagnostic Agents] Other (See Comments)    Patient  states "I don't remember exactly what happens but I know I cannot have contrast for testing."     Review of Systems negative except from HPI and PMH  Physical Exam BP 118/74   Pulse 70   Ht 5\' 7"  (1.702 m)   Wt 218 lb 12.8 oz (99.2 kg)   SpO2 97%   BMI 34.27 kg/m  Well developed and well nourished in no acute distress HENT normal Neck supple with JVP-flat Clear Device pocket well healed; without hematoma or erythema.  There is no tethering  Regular rate and rhythm, no  gallop No murmur Abd-soft with active BS No Clubbing cyanosis  edema Skin-warm and dry A & Oriented  Grossly normal  sensory and motor function  ECG  Sinus rhythm with P synchronous pacing at 75 Intervals 14/18/50 Left axis deviation QRS morphology QRS V1 and upright lead V1   Assessment and  Plan  Polymorphic ventricular tachycardia-- intercurrent nonsustained episodes  Nonischemic cardiomyopathy  Ventricular tachycardia-monomorphic-paced terminated  Coronary artery disease with prior stenting  Amiodarone  Syncope  Heart failure-chronic-mixed  CRT-St Jude from Medtronic The patient's device was interrogated and the information was fully reviewed.  The device was reprogrammed to As Below   Hypothyroidism treated    Tolerating amiodarone.  Has had slow ventricular tachycardia.  Have reprogrammed his detection rates from 130--120.  Increase detection intervals from 40--100.  Continue along with ranolazine  Euvolemic continue current meds  Syncope sounds like it may have been orthostatic, cannot be sure as had some arrhtymia on days in September  We spent more than 50% of our >25 min visit in face to face counseling regarding the above      More than 50% of 40 min was spent in counseling related to the above

## 2019-07-04 NOTE — Patient Instructions (Signed)

## 2019-07-13 LAB — CUP PACEART INCLINIC DEVICE CHECK
Battery Remaining Longevity: 58 mo
Brady Statistic RA Percent Paced: 99.84 %
Brady Statistic RV Percent Paced: 99.57 %
Date Time Interrogation Session: 20201208152300
HighPow Impedance: 70 Ohm
Implantable Lead Implant Date: 20130717
Implantable Lead Implant Date: 20130717
Implantable Lead Implant Date: 20130717
Implantable Lead Location: 753858
Implantable Lead Location: 753859
Implantable Lead Location: 753860
Implantable Lead Model: 4396
Implantable Lead Model: 5076
Implantable Lead Model: 6935
Implantable Pulse Generator Implant Date: 20191129
Lead Channel Impedance Value: 380 Ohm
Lead Channel Impedance Value: 530 Ohm
Lead Channel Impedance Value: 990 Ohm
Lead Channel Pacing Threshold Amplitude: 0.75 V
Lead Channel Pacing Threshold Amplitude: 0.75 V
Lead Channel Pacing Threshold Amplitude: 1.25 V
Lead Channel Pacing Threshold Pulse Width: 0.5 ms
Lead Channel Pacing Threshold Pulse Width: 0.5 ms
Lead Channel Pacing Threshold Pulse Width: 0.5 ms
Lead Channel Sensing Intrinsic Amplitude: 2.7 mV
Lead Channel Sensing Intrinsic Amplitude: 5.9 mV
Lead Channel Setting Pacing Amplitude: 2 V
Lead Channel Setting Pacing Amplitude: 2.25 V
Lead Channel Setting Pacing Amplitude: 2.5 V
Lead Channel Setting Pacing Pulse Width: 0.5 ms
Lead Channel Setting Pacing Pulse Width: 0.5 ms
Lead Channel Setting Sensing Sensitivity: 0.5 mV
Pulse Gen Serial Number: 9812589

## 2019-07-17 ENCOUNTER — Telehealth: Payer: Self-pay | Admitting: Emergency Medicine

## 2019-07-17 DIAGNOSIS — I472 Ventricular tachycardia, unspecified: Secondary | ICD-10-CM

## 2019-07-17 NOTE — Telephone Encounter (Signed)
Contacted patient due to alert received for 3 VT episodes that were successfully treated by multiple rounds of  ATP , and 11  episodes of NSVT on 07/12/19. Patient was aymptomatic.  No missed doses of Amiodoone or Ranexa. [place] DMV driving restrictions reviewed with patient.

## 2019-07-19 NOTE — Progress Notes (Signed)
ICD remote 

## 2019-07-22 NOTE — Telephone Encounter (Signed)
C  lets have him increase his amio >> 400 bid x 2 weeks  400 qd x 2 weeks then 200 mg a day We can also bring him in to the office for reprogramming of his ATP  We also have to make sure his VT detection-- is between 550 and 600 msec Thanks SK

## 2019-08-02 MED ORDER — AMIODARONE HCL 200 MG PO TABS: ORAL_TABLET | ORAL | 0 refills | Status: DC

## 2019-08-02 NOTE — Telephone Encounter (Signed)
Patient called and given orders from DR Caryl Comes to increase amiodorone to 400 mg twice a day x 2 weeks( January 7- August 16, 2019) , then to take amiodorone 400 mg daily for 2 weeks( January 21- August 31, 2019), and then resume amiodorone 200 mg daily  on September 02, 2019. Patient aware he will be scheduled for appointment with Dr Caryl Comes for ICD check. Medication order sent to [facility] in [place] at patient request.  Will have scheduler call patient with appointment  with Dr Caryl Comes to reprogram his ATP and insure VT detection is 550-600 ms.

## 2019-08-08 LAB — CUP PACEART INCLINIC DEVICE CHECK
Date Time Interrogation Session: 20200901165526
Implantable Lead Implant Date: 20130717
Implantable Lead Implant Date: 20130717
Implantable Lead Implant Date: 20130717
Implantable Lead Location: 753858
Implantable Lead Location: 753859
Implantable Lead Location: 753860
Implantable Lead Model: 4396
Implantable Lead Model: 5076
Implantable Lead Model: 6935
Implantable Pulse Generator Implant Date: 20191129
Pulse Gen Serial Number: 9812589

## 2019-09-05 DIAGNOSIS — Z0001 Encounter for general adult medical examination with abnormal findings: Secondary | ICD-10-CM | POA: Diagnosis not present

## 2019-09-05 DIAGNOSIS — E034 Atrophy of thyroid (acquired): Secondary | ICD-10-CM | POA: Diagnosis not present

## 2019-09-05 DIAGNOSIS — E7800 Pure hypercholesterolemia, unspecified: Secondary | ICD-10-CM | POA: Diagnosis not present

## 2019-09-05 DIAGNOSIS — I2510 Atherosclerotic heart disease of native coronary artery without angina pectoris: Secondary | ICD-10-CM | POA: Diagnosis not present

## 2019-09-12 DIAGNOSIS — Z0000 Encounter for general adult medical examination without abnormal findings: Secondary | ICD-10-CM | POA: Diagnosis not present

## 2019-09-12 DIAGNOSIS — E034 Atrophy of thyroid (acquired): Secondary | ICD-10-CM | POA: Diagnosis not present

## 2019-09-12 DIAGNOSIS — Z87891 Personal history of nicotine dependence: Secondary | ICD-10-CM | POA: Diagnosis not present

## 2019-09-12 DIAGNOSIS — I2510 Atherosclerotic heart disease of native coronary artery without angina pectoris: Secondary | ICD-10-CM | POA: Diagnosis not present

## 2019-09-12 DIAGNOSIS — Z1331 Encounter for screening for depression: Secondary | ICD-10-CM | POA: Diagnosis not present

## 2019-09-13 ENCOUNTER — Ambulatory Visit: Payer: Medicare HMO | Admitting: Internal Medicine

## 2019-09-13 ENCOUNTER — Encounter: Payer: Self-pay | Admitting: Internal Medicine

## 2019-09-13 ENCOUNTER — Other Ambulatory Visit: Payer: Self-pay

## 2019-09-13 VITALS — BP 112/72 | HR 74 | Ht 67.0 in | Wt 218.8 lb

## 2019-09-13 DIAGNOSIS — I428 Other cardiomyopathies: Secondary | ICD-10-CM | POA: Diagnosis not present

## 2019-09-13 DIAGNOSIS — I472 Ventricular tachycardia, unspecified: Secondary | ICD-10-CM

## 2019-09-13 NOTE — Progress Notes (Signed)
Patient Care Team: Baxter Hire, MD as PCP - General (Internal Medicine)   HPI  Jerry Farley is a 74 y.o. male Seen in followup for ICD implanted for polymorphic ventricular tachycardiato remotely for which he underwent device generator replacement 7/13. ERI and underwent generator replacement 10/19 and for reasons that I do not recall received a St Jude replacement of a Medtronic device  On Amiodarone and adjunctive ranolazine    History of a 6949-lead which was revised the time of CRT upgrade receiving an atrial lead and an LV lead.   coronary artery disease with prior stenting of his circumflex.   Recurrent VT of late--slow requiring device reprogramming 12/20  Some sob, sleep disordered breathing, no chest pain no edema  No palps  DATE TEST EF   2007 LHC 25% Cx Stent   2011 myoview   29 % Inferoapical scar  7/14 Echo    40-45% % LAE mild  12/16 Echo   40-45%   9/19 LHC 25-35% Cx stent patent O/w nonobstructive       Date Cr K TSH Hgb LFTs PFTs  11/16    0.48  28    6/17    1.6  30    8/18   4.96 (on synthroid)  30   12/18 1.1 4.8 2.19  33   8/19 1.0 4.6 3.06  32   1/20 1.1 4.4 5.95  30   9/20 1.21 4.3 4.22  41   2/21 1.2 4.4 9.847 ( dose 88>>100) 13.5 37      Patient denies symptoms of GI intolerance, sun sensitivity, neurological symptoms attributable to amiodarone. There is a cough, but productive chronic and unchanged        Past Medical History:  Diagnosis Date  . 6948-lead   . Arthritis    "maybe a little"  . CAD (coronary artery disease) July 2007   CFX stent (patent Sept '07)  . Cardiac defibrillator CRT-medtronic 01/26/2011  . Chronic systolic congestive heart failure ([place])   . Complication of anesthesia    "problems waking up w/valium"  . High cholesterol   . Hypertension   . Hypothyroidism   . ICD (implantable cardiac defibrillator) in place 2006, 2013   Medtronic Protecta XT-DR CRT (01/2012)  . NICM (nonischemic  cardiomyopathy) ([place])    ejection fraction 40-45% by echo July 2012  . Non-ischemic cardiomyopathy- EF 40-45% 2D 7/14 02/11/2012  . Obesity (BMI 30-39.9) 08/22/2013  . Ventricular tachycardia, non-sustained ([place]) 2007   Amiodarone    Past Surgical History:  Procedure Laterality Date  . BI-VENTRICULAR IMPLANTABLE CARDIOVERTER DEFIBRILLATOR UPGRADE N/A 02/10/2012   Procedure: BI-VENTRICULAR IMPLANTABLE CARDIOVERTER DEFIBRILLATOR UPGRADE;  Surgeon: Deboraha Sprang, MD;  Location: [facility] CATH LAB;  Service: Cardiovascular;  Laterality: N/A;  . BIV ICD GENERATOR CHANGEOUT N/A 06/24/2018   Procedure: BIV ICD GENERATOR CHANGEOUT;  Surgeon: Deboraha Sprang, MD;  Location: [place] CV LAB;  Service: Cardiovascular;  Laterality: N/A;  . CARDIAC CATHETERIZATION  11/30/2001   non-ischemic cardiomyopahty with high LV end-diastolic pressure; A999333 distal L main stenosis, large osital stenosis 70%, 70% Cfx stenosis (Dr. Domenic Moras)  . CARDIAC CATHETERIZATION  9/07   CFX stent patent [employer])  . CARDIAC DEFIBRILLATOR PLACEMENT  09/2004; 02/10/12   ICD implanted in 2006 (Dr. Rito Ehrlich); EOL generator change in 01/2012 - CRT (BiV ICD) (Dr. Caryl Comes)  . CORONARY ANGIOPLASTY WITH STENT PLACEMENT   01/2006   CFX   . LEFT HEART CATH AND  CORONARY ANGIOGRAPHY N/A 04/13/2019   Procedure: LEFT HEART CATH AND CORONARY ANGIOGRAPHY;  Surgeon: Jettie Booze, MD;  Location: [place] CV LAB;  Service: Cardiovascular;  Laterality: N/A;  . NM MYOCAR PERF EJECTION FRACTION  05/2010   dipyridamole myoview; mod perfusion defect in apical, basal inferior, mid inferior & apical inferior regions (infarct/scar); post-stress 28%, low risk scan  . TONSILLECTOMY AND ADENOIDECTOMY     "when I was a child"  . TRANSTHORACIC ECHOCARDIOGRAM  01/2013   EF 40-45%, LV mildly dilated, mild LVH, mod hypokinesis of inferior myocardium, grade 1 diastolic dysfunction; mild MR    Current Outpatient Medications  Medication Sig Dispense Refill  .  amiodarone (PACERONE) 200 MG tablet Take 1 tablet (200 mg total) by mouth daily. 90 tablet 3  . aspirin 81 MG tablet Take 81 mg by mouth daily.      . carvedilol (COREG) 25 MG tablet TAKE 1 TABLET BY MOUTH TWICE DAILY WITH MEALS 180 tablet 3  . clopidogrel (PLAVIX) 75 MG tablet Take 1 tablet by mouth once daily 90 tablet 3  . fish oil-omega-3 fatty acids 1000 MG capsule Take 1 g by mouth daily.     Marland Kitchen levothyroxine (SYNTHROID) 100 MCG tablet Take 100 mcg by mouth daily.    Marland Kitchen losartan (COZAAR) 50 MG tablet Take 1 tablet (50 mg total) by mouth daily. 90 tablet 3  . Magnesium Oxide 400 MG CAPS Take 400 mg by mouth daily.     . Multiple Vitamin (MULTIVITAMIN) tablet Take 1 tablet by mouth daily.      Marland Kitchen oxymetazoline (AFRIN) 0.05 % nasal spray Place 1 spray into both nostrils 2 (two) times daily as needed for congestion.    . ranolazine (RANEXA) 500 MG 12 hr tablet Take 500 mg by mouth 2 (two) times daily.    . rosuvastatin (CRESTOR) 20 MG tablet Take 20 mg by mouth daily.    Marland Kitchen spironolactone (ALDACTONE) 25 MG tablet Take 12.5 mg by mouth daily.     No current facility-administered medications for this visit.    Allergies  Allergen Reactions  . Contrast Media [Iodinated Diagnostic Agents] Other (See Comments)    Patient states "I don't remember exactly what happens but I know I cannot have contrast for testing."     Review of Systems negative except from HPI and PMH  Physical Exam BP 112/72   Pulse 74   Ht 5\' 7"  (1.702 m)   Wt 218 lb 12.8 oz (99.2 kg)   BMI 34.27 kg/m  Well developed and well nourished in no acute distress HENT normal Neck supple with JVP-flat Clear Device pocket well healed; without hematoma or erythema.  There is no tethering  Regular rate and rhythm, no   murmur Abd-soft with active BS No Clubbing cyanosis  edema Skin-warm and dry A & Oriented  Grossly normal sensory and motor function  ECG AV pacing upright QRSA V1 and neg lead 1    No intercurrent VT  since 07/22/19  Assessment and  Plan  Polymorphic ventricular tachycardia-- intercurrent nonsustained episodes  Nonischemic cardiomyopathy  Ventricular tachycardia-monomorphic-paced terminated  Coronary artery disease with prior stenting  Amiodarone   Syncope  Heart failure-chronic-mixed  CRT-D St Jude from Medtronic   Hypothyroidism treated  Tolerating amiodarone.  No intercurrent sustained or treated VT.  Marked decrease in nonsustained VT burden.  Without symptoms of ischemia  Probably has sleep apnea.  He will check with his wife.  He will let us know.  Hypothyroidism  is worse on amiodarone.  We will continue the amiodarone but his PCP has just increased his Synthroid.  Euvolemic continue current meds       More than 50% of 40 min was spent in counseling related to the above

## 2019-09-13 NOTE — Patient Instructions (Signed)
Medication Instructions:  Your physician recommends that you continue on your current medications as directed. Please refer to the Current Medication list given to you today.  Labwork: None ordered.  Testing/Procedures: None ordered.  Follow-Up: Your physician wants you to follow-up in: 6 months with Dr Klein. You will receive a reminder letter in the mail two months in advance. If you don't receive a letter, please call our office to schedule the follow-up appointment.  Remote monitoring is used to monitor your Pacemaker of ICD from home. This monitoring reduces the number of office visits required to check your device to one time per year. It allows us to keep an eye on the functioning of your device to ensure it is working properly.Any Other Special Instructions Will Be Listed Below (If Applicable).  If you need a refill on your cardiac medications before your next appointment, please call your pharmacy.   

## 2019-09-18 LAB — CUP PACEART INCLINIC DEVICE CHECK
Brady Statistic RA Percent Paced: 98 %
Brady Statistic RV Percent Paced: 98 %
Date Time Interrogation Session: 20210217091036
Implantable Lead Implant Date: 20130717
Implantable Lead Implant Date: 20130717
Implantable Lead Implant Date: 20130717
Implantable Lead Location: 753858
Implantable Lead Location: 753859
Implantable Lead Location: 753860
Implantable Lead Model: 4396
Implantable Lead Model: 5076
Implantable Lead Model: 6935
Implantable Pulse Generator Implant Date: 20191129
Lead Channel Pacing Threshold Amplitude: 0.75 V
Lead Channel Pacing Threshold Amplitude: 0.75 V
Lead Channel Pacing Threshold Amplitude: 1.25 V
Lead Channel Pacing Threshold Pulse Width: 0.5 ms
Lead Channel Pacing Threshold Pulse Width: 0.5 ms
Lead Channel Pacing Threshold Pulse Width: 0.5 ms
Lead Channel Sensing Intrinsic Amplitude: 2.7 mV
Lead Channel Sensing Intrinsic Amplitude: 5.3 mV
Pulse Gen Serial Number: 9812589

## 2019-09-20 ENCOUNTER — Other Ambulatory Visit: Payer: Self-pay | Admitting: Internal Medicine

## 2019-09-20 DIAGNOSIS — I472 Ventricular tachycardia: Secondary | ICD-10-CM

## 2019-09-20 DIAGNOSIS — I4729 Other ventricular tachycardia: Secondary | ICD-10-CM

## 2019-09-25 ENCOUNTER — Ambulatory Visit (INDEPENDENT_AMBULATORY_CARE_PROVIDER_SITE_OTHER): Payer: Medicare HMO | Admitting: *Deleted

## 2019-09-25 DIAGNOSIS — I5022 Chronic systolic (congestive) heart failure: Secondary | ICD-10-CM

## 2019-09-25 LAB — CUP PACEART REMOTE DEVICE CHECK
Battery Remaining Longevity: 59 mo
Battery Remaining Percentage: 77 %
Battery Voltage: 2.96 V
Brady Statistic AP VP Percent: 98 %
Brady Statistic AP VS Percent: 1.8 %
Brady Statistic AS VP Percent: 1 %
Brady Statistic AS VS Percent: 1 %
Brady Statistic RA Percent Paced: 99 %
Date Time Interrogation Session: 20210301020017
HighPow Impedance: 72 Ohm
HighPow Impedance: 72 Ohm
Implantable Lead Implant Date: 20130717
Implantable Lead Implant Date: 20130717
Implantable Lead Implant Date: 20130717
Implantable Lead Location: 753858
Implantable Lead Location: 753859
Implantable Lead Location: 753860
Implantable Lead Model: 4396
Implantable Lead Model: 5076
Implantable Lead Model: 6935
Implantable Pulse Generator Implant Date: 20191129
Lead Channel Impedance Value: 380 Ohm
Lead Channel Impedance Value: 540 Ohm
Lead Channel Impedance Value: 830 Ohm
Lead Channel Pacing Threshold Amplitude: 0.75 V
Lead Channel Pacing Threshold Amplitude: 0.75 V
Lead Channel Pacing Threshold Amplitude: 1.25 V
Lead Channel Pacing Threshold Pulse Width: 0.5 ms
Lead Channel Pacing Threshold Pulse Width: 0.5 ms
Lead Channel Pacing Threshold Pulse Width: 0.5 ms
Lead Channel Sensing Intrinsic Amplitude: 1.5 mV
Lead Channel Sensing Intrinsic Amplitude: 9.3 mV
Lead Channel Setting Pacing Amplitude: 2 V
Lead Channel Setting Pacing Amplitude: 2.25 V
Lead Channel Setting Pacing Amplitude: 2.5 V
Lead Channel Setting Pacing Pulse Width: 0.5 ms
Lead Channel Setting Pacing Pulse Width: 0.5 ms
Lead Channel Setting Sensing Sensitivity: 0.5 mV
Pulse Gen Serial Number: 9812589

## 2019-09-25 NOTE — Progress Notes (Signed)
ICD Remote  

## 2019-10-24 ENCOUNTER — Other Ambulatory Visit: Payer: Self-pay | Admitting: Internal Medicine

## 2019-12-26 LAB — CUP PACEART REMOTE DEVICE CHECK
Battery Remaining Longevity: 56 mo
Battery Remaining Percentage: 74 %
Battery Voltage: 2.96 V
Brady Statistic AP VP Percent: 97 %
Brady Statistic AP VS Percent: 2.4 %
Brady Statistic AS VP Percent: 1 %
Brady Statistic AS VS Percent: 1 %
Brady Statistic RA Percent Paced: 98 %
Date Time Interrogation Session: 20210531020015
HighPow Impedance: 73 Ohm
HighPow Impedance: 73 Ohm
Implantable Lead Implant Date: 20130717
Implantable Lead Implant Date: 20130717
Implantable Lead Implant Date: 20130717
Implantable Lead Location: 753858
Implantable Lead Location: 753859
Implantable Lead Location: 753860
Implantable Lead Model: 4396
Implantable Lead Model: 5076
Implantable Lead Model: 6935
Implantable Pulse Generator Implant Date: 20191129
Lead Channel Impedance Value: 360 Ohm
Lead Channel Impedance Value: 530 Ohm
Lead Channel Impedance Value: 760 Ohm
Lead Channel Pacing Threshold Amplitude: 0.75 V
Lead Channel Pacing Threshold Amplitude: 0.75 V
Lead Channel Pacing Threshold Amplitude: 1.25 V
Lead Channel Pacing Threshold Pulse Width: 0.5 ms
Lead Channel Pacing Threshold Pulse Width: 0.5 ms
Lead Channel Pacing Threshold Pulse Width: 0.5 ms
Lead Channel Sensing Intrinsic Amplitude: 2.6 mV
Lead Channel Sensing Intrinsic Amplitude: 7.1 mV
Lead Channel Setting Pacing Amplitude: 2 V
Lead Channel Setting Pacing Amplitude: 2.25 V
Lead Channel Setting Pacing Amplitude: 2.5 V
Lead Channel Setting Pacing Pulse Width: 0.5 ms
Lead Channel Setting Pacing Pulse Width: 0.5 ms
Lead Channel Setting Sensing Sensitivity: 0.5 mV
Pulse Gen Serial Number: 9812589

## 2019-12-27 ENCOUNTER — Ambulatory Visit (INDEPENDENT_AMBULATORY_CARE_PROVIDER_SITE_OTHER): Payer: Medicare HMO | Admitting: *Deleted

## 2019-12-27 DIAGNOSIS — I428 Other cardiomyopathies: Secondary | ICD-10-CM | POA: Diagnosis not present

## 2019-12-27 DIAGNOSIS — I472 Ventricular tachycardia, unspecified: Secondary | ICD-10-CM

## 2019-12-28 NOTE — Progress Notes (Signed)
Remote ICD transmission.   

## 2020-01-03 DIAGNOSIS — E034 Atrophy of thyroid (acquired): Secondary | ICD-10-CM | POA: Diagnosis not present

## 2020-01-10 DIAGNOSIS — Z87891 Personal history of nicotine dependence: Secondary | ICD-10-CM | POA: Diagnosis not present

## 2020-01-10 DIAGNOSIS — E7800 Pure hypercholesterolemia, unspecified: Secondary | ICD-10-CM | POA: Diagnosis not present

## 2020-01-10 DIAGNOSIS — Z95810 Presence of automatic (implantable) cardiac defibrillator: Secondary | ICD-10-CM | POA: Diagnosis not present

## 2020-01-10 DIAGNOSIS — E034 Atrophy of thyroid (acquired): Secondary | ICD-10-CM | POA: Diagnosis not present

## 2020-01-10 DIAGNOSIS — E038 Other specified hypothyroidism: Secondary | ICD-10-CM | POA: Diagnosis not present

## 2020-01-10 DIAGNOSIS — I2510 Atherosclerotic heart disease of native coronary artery without angina pectoris: Secondary | ICD-10-CM | POA: Diagnosis not present

## 2020-03-11 ENCOUNTER — Other Ambulatory Visit: Payer: Self-pay

## 2020-03-11 ENCOUNTER — Ambulatory Visit: Payer: Medicare HMO | Admitting: Internal Medicine

## 2020-03-11 ENCOUNTER — Encounter: Payer: Self-pay | Admitting: Internal Medicine

## 2020-03-11 VITALS — BP 110/70 | HR 70 | Ht 67.0 in | Wt 207.0 lb

## 2020-03-11 DIAGNOSIS — I472 Ventricular tachycardia, unspecified: Secondary | ICD-10-CM

## 2020-03-11 DIAGNOSIS — E032 Hypothyroidism due to medicaments and other exogenous substances: Secondary | ICD-10-CM

## 2020-03-11 DIAGNOSIS — Z95810 Presence of automatic (implantable) cardiac defibrillator: Secondary | ICD-10-CM

## 2020-03-11 DIAGNOSIS — I5022 Chronic systolic (congestive) heart failure: Secondary | ICD-10-CM

## 2020-03-11 DIAGNOSIS — I428 Other cardiomyopathies: Secondary | ICD-10-CM

## 2020-03-11 DIAGNOSIS — I4729 Other ventricular tachycardia: Secondary | ICD-10-CM

## 2020-03-11 DIAGNOSIS — Z79899 Other long term (current) drug therapy: Secondary | ICD-10-CM

## 2020-03-11 MED ORDER — AMIODARONE HCL 200 MG PO TABS: ORAL_TABLET | ORAL | 3 refills | Status: DC

## 2020-03-11 NOTE — Patient Instructions (Addendum)
Medication Instructions:  Your physician has recommended you make the following change in your medication:   Decrease your Amiodarone from 7 days to 5 days per week.  Monday-Friday.   *If you need a refill on your cardiac medications before your next appointment, please call your pharmacy*   Lab Work: TSH and Liver panel today  If you have labs (blood work) drawn today and your tests are completely normal, you will receive your results only by: Marland Kitchen MyChart Message (if you have MyChart) OR . A paper copy in the mail If you have any lab test that is abnormal or we need to change your treatment, we will call you to review the results.   Testing/Procedures: None ordered.    Follow-Up: At [facility], you and your health needs are our priority.  As part of our continuing mission to provide you with exceptional heart care, we have created designated Provider Care Teams.  These Care Teams include your primary Cardiologist (physician) and Advanced Practice Providers (APPs -  Physician Assistants and Nurse Practitioners) who all work together to provide you with the care you need, when you need it.  We recommend signing up for the patient portal called "MyChart".  Sign up information is provided on this After Visit Summary.  MyChart is used to connect with patients for Virtual Visits (Telemedicine).  Patients are able to view lab/test results, encounter notes, upcoming appointments, etc.  Non-urgent messages can be sent to your provider as well.   To learn more about what you can do with MyChart, go to [email or web address].    Your next appointment:   6 month(s)  The format for your next appointment:   In Person  Provider: Dr Caryl Comes

## 2020-03-11 NOTE — Progress Notes (Signed)
Patient Care Team: Baxter Hire, MD as PCP - General (Internal Medicine)   HPI  Jerry Farley is a 74 y.o. male Seen in followup for ICD implanted for polymorphic ventricular tachycardia   remotely for which he underwent device generator replacement 7/13. ERI and underwent generator replacement 10/19 and for reasons that I do not recall received a St Jude replacement of a Medtronic device  On Amiodarone and adjunctive ranolazine   History of 6949-lead which was revised the time of CRT upgrade receiving an atrial lead and an LV lead.   coronary artery disease with prior stenting of his circumflex.   Recurrent VT of late--slow requiring device reprogramming 12/20   Modest DOE--stable   No edema  No chest pain   DATE TEST EF   2007 LHC 25% Cx Stent   2011 myoview   29 % Inferoapical scar  7/14 Echo    40-45% % LAE mild  12/16 Echo   40-45%   9/19 LHC 25-35% Cx stent patent O/w nonobstructive       Date Cr K TSH Hgb LFTs PFTs  11/16    0.48  28    6/17    1.6  30    8/18   4.96 (on synthroid)  30   12/18 1.1 4.8 2.19  33   8/19 1.0 4.6 3.06  32   1/20 1.1 4.4 5.95  30   9/20 1.21 4.3 4.22  41   2/21 1.2 4.4 9.847 ( dose 88>>100) 13.5 37   6/21 1.2` 4.4 6.7          Past Medical History:  Diagnosis Date  . 6948-lead   . Arthritis    "maybe a little"  . CAD (coronary artery disease) July 2007   CFX stent (patent Sept '07)  . Cardiac defibrillator CRT-medtronic 01/26/2011  . Chronic systolic congestive heart failure ([place])   . Complication of anesthesia    "problems waking up w/valium"  . High cholesterol   . Hypertension   . Hypothyroidism   . ICD (implantable cardiac defibrillator) in place 2006, 2013   Medtronic Protecta XT-DR CRT (01/2012)  . NICM (nonischemic cardiomyopathy) ([place])    ejection fraction 40-45% by echo July 2012  . Non-ischemic cardiomyopathy- EF 40-45% 2D 7/14 02/11/2012  . Obesity (BMI 30-39.9) 08/22/2013  . Ventricular  tachycardia, non-sustained ([place]) 2007   Amiodarone    Past Surgical History:  Procedure Laterality Date  . BI-VENTRICULAR IMPLANTABLE CARDIOVERTER DEFIBRILLATOR UPGRADE N/A 02/10/2012   Procedure: BI-VENTRICULAR IMPLANTABLE CARDIOVERTER DEFIBRILLATOR UPGRADE;  Surgeon: Deboraha Sprang, MD;  Location: [facility] C[facility] LAB;  Service: Cardiovascular;  Laterality: N/A;  . BIV ICD GENERATOR CHANGEOUT N/A 06/24/2018   Procedure: BIV ICD GENERATOR CHANGEOUT;  Surgeon: Deboraha Sprang, MD;  Location: [facility] CV LAB;  Service: Cardiovascular;  Laterality: N/A;  . CARDIAC CATHETERIZATION  11/30/2001   non-ischemic cardiomyopahty with high LV end-diastolic pressure; 72% distal L main stenosis, large osital stenosis 70%, 70% Cfx stenosis (Dr. Domenic Moras)  . CARDIAC CATHETERIZATION  9/07   CFX stent patent [employer])  . CARDIAC DEFIBRILLATOR PLACEMENT  09/2004; 02/10/12   ICD implanted in 2006 (Dr. Rito Ehrlich); EOL generator change in 01/2012 - CRT (BiV ICD) (Dr. Caryl Comes)  . CORONARY ANGIOPLASTY WITH STENT PLACEMENT   01/2006   CFX   . LEFT HEART CATH AND CORONARY ANGIOGRAPHY N/A 04/13/2019   Procedure: LEFT HEART CATH AND CORONARY ANGIOGRAPHY;  Surgeon: Jettie Booze, MD;  Location: [place] CV LAB;  Service: Cardiovascular;  Laterality: N/A;  . NM MYOCAR PERF EJECTION FRACTION  05/2010   dipyridamole myoview; mod perfusion defect in apical, basal inferior, mid inferior & apical inferior regions (infarct/scar); post-stress 28%, low risk scan  . TONSILLECTOMY AND ADENOIDECTOMY     "when I was a child"  . TRANSTHORACIC ECHOCARDIOGRAM  01/2013   EF 40-45%, LV mildly dilated, mild LVH, mod hypokinesis of inferior myocardium, grade 1 diastolic dysfunction; mild MR    Current Outpatient Medications  Medication Sig Dispense Refill  . amiodarone (PACERONE) 200 MG tablet Take 1 tablet (200 mg total) by mouth daily. 90 tablet 3  . aspirin 81 MG tablet Take 81 mg by mouth daily.      . carvedilol (COREG) 25 MG tablet  TAKE 1 TABLET BY MOUTH TWICE DAILY WITH MEALS 180 tablet 3  . clopidogrel (PLAVIX) 75 MG tablet Take 1 tablet by mouth once daily 90 tablet 3  . EUTHYROX 112 MCG tablet Take 1 tablet by mouth daily.    . fish oil-omega-3 fatty acids 1000 MG capsule Take 1 g by mouth daily.     Marland Kitchen losartan (COZAAR) 50 MG tablet Take 1 tablet (50 mg total) by mouth daily. 90 tablet 3  . Magnesium Oxide 400 MG CAPS Take 400 mg by mouth daily.     . Multiple Vitamin (MULTIVITAMIN) tablet Take 1 tablet by mouth daily.      Marland Kitchen oxymetazoline (AFRIN) 0.05 % nasal spray Place 1 spray into both nostrils 2 (two) times daily as needed for congestion.    . ranolazine (RANEXA) 500 MG 12 hr tablet Take 1 tablet by mouth twice daily 180 tablet 3  . rosuvastatin (CRESTOR) 20 MG tablet Take 1 tablet by mouth once daily 90 tablet 3  . spironolactone (ALDACTONE) 25 MG tablet Take 1/2 (one-half) tablet by mouth once daily 45 tablet 3   No current facility-administered medications for this visit.    Allergies  Allergen Reactions  . Contrast Media [Iodinated Diagnostic Agents] Other (See Comments)    Patient states "I don't remember exactly what happens but I know I cannot have contrast for testing."     Review of Systems negative except from HPI and PMH  Physical Exam BP 110/70   Pulse 70   Ht 5\' 7"  (1.702 m)   Wt 207 lb (93.9 kg)   SpO2 97%   BMI 32.42 kg/m  Well developed and well nourished in no acute distress HENT normal Neck supple with JVP-flat Clear Device pocket well healed; without hematoma or erythema.  There is no tethering  Regular rate and rhythm, no   murmur Abd-soft with active BS No Clubbing cyanosis   edema Skin-warm and dry A & Oriented  Grossly normal sensory and motor function  ECG AV pacing at 78 Intervals 16/2 20/48 Upright QRS lead V1 negative QRS lead I   No intercurrent VT since 07/22/19  Assessment and  Plan  Polymorphic ventricular tachycardia-- intercurrent nonsustained  episodes  Nonischemic cardiomyopathy  Ventricular tachycardia-monomorphic-paced terminated  Coronary artery disease with prior stenting  High Risk Medication Surveillance Amiodarone   Syncope  Heart failure-chronic-mixed  CRT-D St Jude from Medtronic   Hypothyroidism treated ( dose recently changed from 100>>112)   Tolerating amiodarone.  Decreased nonsustained VT.  We will decrease his amiodarone from 7 days a week to 5 days a week. This may also help the hypothyroidism   Needs amiodarone surveillance laboratories.  Euvolemic.  Continue current  medications.  Device was reprogrammed to increase ventricular filling times with a change of his AV interval from 130--180.  We also activated LV offset with premature LV pacing by 20 ms with the impact of decreasing the QRS positivity in lead V1 without improvement in the QRS duration.  No interval sustained ventricular arrhythmias or syncope  Without symptoms of ischemia

## 2020-03-12 LAB — HEPATIC FUNCTION PANEL
ALT: 67 IU/L — ABNORMAL HIGH (ref 0–44)
AST: 62 IU/L — ABNORMAL HIGH (ref 0–40)
Albumin: 4.4 g/dL (ref 3.7–4.7)
Alkaline Phosphatase: 51 IU/L (ref 48–121)
Bilirubin Total: 0.4 mg/dL (ref 0.0–1.2)
Bilirubin, Direct: 0.16 mg/dL (ref 0.00–0.40)
Total Protein: 6.3 g/dL (ref 6.0–8.5)

## 2020-03-12 LAB — TSH: TSH: 4.66 u[IU]/mL — ABNORMAL HIGH (ref 0.450–4.500)

## 2020-03-20 ENCOUNTER — Other Ambulatory Visit: Payer: Self-pay | Admitting: Internal Medicine

## 2020-03-27 ENCOUNTER — Ambulatory Visit (INDEPENDENT_AMBULATORY_CARE_PROVIDER_SITE_OTHER): Payer: Medicare HMO | Admitting: *Deleted

## 2020-03-27 DIAGNOSIS — I472 Ventricular tachycardia, unspecified: Secondary | ICD-10-CM

## 2020-03-27 LAB — CUP PACEART REMOTE DEVICE CHECK
Battery Remaining Longevity: 53 mo
Battery Remaining Percentage: 70 %
Battery Voltage: 2.95 V
Brady Statistic AP VP Percent: 95 %
Brady Statistic AP VS Percent: 4.3 %
Brady Statistic AS VP Percent: 1 %
Brady Statistic AS VS Percent: 1 %
Brady Statistic RA Percent Paced: 99 %
Date Time Interrogation Session: 20210901020015
HighPow Impedance: 75 Ohm
HighPow Impedance: 75 Ohm
Implantable Lead Implant Date: 20130717
Implantable Lead Implant Date: 20130717
Implantable Lead Implant Date: 20130717
Implantable Lead Location: 753858
Implantable Lead Location: 753859
Implantable Lead Location: 753860
Implantable Lead Model: 4396
Implantable Lead Model: 5076
Implantable Lead Model: 6935
Implantable Pulse Generator Implant Date: 20191129
Lead Channel Impedance Value: 380 Ohm
Lead Channel Impedance Value: 550 Ohm
Lead Channel Impedance Value: 790 Ohm
Lead Channel Pacing Threshold Amplitude: 0.75 V
Lead Channel Pacing Threshold Amplitude: 1 V
Lead Channel Pacing Threshold Amplitude: 1 V
Lead Channel Pacing Threshold Pulse Width: 0.5 ms
Lead Channel Pacing Threshold Pulse Width: 0.5 ms
Lead Channel Pacing Threshold Pulse Width: 0.5 ms
Lead Channel Sensing Intrinsic Amplitude: 2.6 mV
Lead Channel Sensing Intrinsic Amplitude: 8.6 mV
Lead Channel Setting Pacing Amplitude: 2 V
Lead Channel Setting Pacing Amplitude: 2.25 V
Lead Channel Setting Pacing Amplitude: 2.5 V
Lead Channel Setting Pacing Pulse Width: 0.5 ms
Lead Channel Setting Pacing Pulse Width: 0.5 ms
Lead Channel Setting Sensing Sensitivity: 0.5 mV
Pulse Gen Serial Number: 9812589

## 2020-03-28 NOTE — Progress Notes (Signed)
Remote ICD transmission.   

## 2020-04-02 ENCOUNTER — Telehealth: Payer: Self-pay | Admitting: Internal Medicine

## 2020-04-02 NOTE — Telephone Encounter (Signed)
Patient returned called for lab results.

## 2020-04-03 NOTE — Telephone Encounter (Signed)
   Pt returning call, pt said will be busy today after 30 mins and call him back tomorrow

## 2020-04-09 ENCOUNTER — Telehealth: Payer: Self-pay | Admitting: Internal Medicine

## 2020-04-09 NOTE — Telephone Encounter (Signed)
New message:    Patient calling stating that some one called him concering some results. Please call patient.

## 2020-04-09 NOTE — Telephone Encounter (Signed)
Attempted phone call to pt.  No answer and no voicemail. 

## 2020-04-09 NOTE — Telephone Encounter (Signed)
-----   Message from Deboraha Sprang, MD sent at 03/29/2020  8:59 PM EDT -----  Please inform patient that  surveillance labs are abnormal   we decreased the dose at the last visit and would suggest we repeat testing in 8 weeks

## 2020-04-09 NOTE — Telephone Encounter (Signed)
Attempted phone call to pt and left voicemail message to contact RN at [phone]. 

## 2020-04-16 ENCOUNTER — Telehealth: Payer: Self-pay

## 2020-04-16 DIAGNOSIS — E032 Hypothyroidism due to medicaments and other exogenous substances: Secondary | ICD-10-CM

## 2020-04-16 DIAGNOSIS — R7989 Other specified abnormal findings of blood chemistry: Secondary | ICD-10-CM

## 2020-04-16 DIAGNOSIS — Z79899 Other long term (current) drug therapy: Secondary | ICD-10-CM

## 2020-04-16 NOTE — Telephone Encounter (Signed)
Spoke with pt and advised per Dr Caryl Comes lab results abnormal but Amiodarone dose was decreased during last visit with Dr Caryl Comes.  Labs will need to be repeated at the 8 week mark.  Appointment scheduled and orders placed.  Pt verbalizes understanding and agrees with current plan.

## 2020-04-16 NOTE — Telephone Encounter (Signed)
-----   Message from Jerry Sprang, MD sent at 03/29/2020  8:59 PM EDT -----  Please inform patient that  surveillance labs are abnormal   we decreased the dose at the last visit and would suggest we repeat testing in 8 weeks

## 2020-04-16 NOTE — Telephone Encounter (Signed)
Pt advised of abnormal Amiodarone surveillance labs.  Per Dr Caryl Comes he would like repeated at 8 week mark.  Appointment scheduled for 05/28/2020 at 830am.  Pt verbalizes understanding and agrees with current plan.

## 2020-05-10 DIAGNOSIS — E034 Atrophy of thyroid (acquired): Secondary | ICD-10-CM | POA: Diagnosis not present

## 2020-05-14 DIAGNOSIS — Z95810 Presence of automatic (implantable) cardiac defibrillator: Secondary | ICD-10-CM | POA: Diagnosis not present

## 2020-05-14 DIAGNOSIS — I2510 Atherosclerotic heart disease of native coronary artery without angina pectoris: Secondary | ICD-10-CM | POA: Diagnosis not present

## 2020-05-14 DIAGNOSIS — Z125 Encounter for screening for malignant neoplasm of prostate: Secondary | ICD-10-CM | POA: Diagnosis not present

## 2020-05-14 DIAGNOSIS — E034 Atrophy of thyroid (acquired): Secondary | ICD-10-CM | POA: Diagnosis not present

## 2020-05-14 DIAGNOSIS — E7800 Pure hypercholesterolemia, unspecified: Secondary | ICD-10-CM | POA: Diagnosis not present

## 2020-05-21 ENCOUNTER — Other Ambulatory Visit: Payer: Self-pay | Admitting: Internal Medicine

## 2020-05-28 ENCOUNTER — Other Ambulatory Visit: Payer: Self-pay

## 2020-05-28 ENCOUNTER — Other Ambulatory Visit: Payer: Medicare HMO | Admitting: *Deleted

## 2020-05-28 DIAGNOSIS — E032 Hypothyroidism due to medicaments and other exogenous substances: Secondary | ICD-10-CM

## 2020-05-28 DIAGNOSIS — Z79899 Other long term (current) drug therapy: Secondary | ICD-10-CM

## 2020-05-28 DIAGNOSIS — R7989 Other specified abnormal findings of blood chemistry: Secondary | ICD-10-CM

## 2020-05-28 DIAGNOSIS — R945 Abnormal results of liver function studies: Secondary | ICD-10-CM | POA: Diagnosis not present

## 2020-05-28 LAB — HEPATIC FUNCTION PANEL
ALT: 85 IU/L — ABNORMAL HIGH (ref 0–44)
AST: 87 IU/L — ABNORMAL HIGH (ref 0–40)
Albumin: 4.4 g/dL (ref 3.7–4.7)
Alkaline Phosphatase: 64 IU/L (ref 44–121)
Bilirubin Total: 0.5 mg/dL (ref 0.0–1.2)
Bilirubin, Direct: 0.15 mg/dL (ref 0.00–0.40)
Total Protein: 6.5 g/dL (ref 6.0–8.5)

## 2020-05-28 LAB — TSH: TSH: 8.17 u[IU]/mL — ABNORMAL HIGH (ref 0.450–4.500)

## 2020-06-12 ENCOUNTER — Telehealth: Payer: Self-pay

## 2020-06-12 DIAGNOSIS — I4729 Other ventricular tachycardia: Secondary | ICD-10-CM

## 2020-06-12 DIAGNOSIS — Z79899 Other long term (current) drug therapy: Secondary | ICD-10-CM

## 2020-06-12 DIAGNOSIS — E032 Hypothyroidism due to medicaments and other exogenous substances: Secondary | ICD-10-CM

## 2020-06-12 DIAGNOSIS — I472 Ventricular tachycardia: Secondary | ICD-10-CM

## 2020-06-12 MED ORDER — AMIODARONE HCL 200 MG PO TABS: 100.0000 mg | ORAL_TABLET | Freq: Every day | ORAL | 3 refills | Status: DC

## 2020-06-12 MED ORDER — LEVOTHYROXINE SODIUM 125 MCG PO TABS: 125.0000 ug | ORAL_TABLET | Freq: Every day | ORAL | 0 refills | Status: DC

## 2020-06-12 NOTE — Telephone Encounter (Signed)
Spoke with Jerry Farley and advised per Dr Caryl Comes amiodarone labs are abnormal and Jerry Farley will need to decrease Amiodarone 200mg   to 100mg  - half of a  tablet by mouth daily.  Increase Levothyroxine to 143mcg daily - Rx sent to pharmacy on file.  Appointment scheduled for repeat Amiodarone labs 08/08/2019.  Jerry Farley verbalizes understanding and agrees with current plan.

## 2020-06-12 NOTE — Telephone Encounter (Signed)
-----   Message from Deboraha Sprang, MD sent at 06/10/2020  5:14 PM EST -----  Please inform patient that drug surveillance labs are abnormal and will need to  1) reduce amio to 100 mg  daily and then reassess in about 8 weeks 2) also we need to increase levothyroxine to 125 mcg dialy Thanks SK

## 2020-06-25 ENCOUNTER — Other Ambulatory Visit: Payer: Self-pay | Admitting: Internal Medicine

## 2020-06-26 ENCOUNTER — Ambulatory Visit (INDEPENDENT_AMBULATORY_CARE_PROVIDER_SITE_OTHER): Payer: Medicare HMO

## 2020-06-26 DIAGNOSIS — I472 Ventricular tachycardia, unspecified: Secondary | ICD-10-CM

## 2020-06-26 LAB — CUP PACEART REMOTE DEVICE CHECK
Battery Remaining Longevity: 50 mo
Battery Remaining Percentage: 66 %
Battery Voltage: 2.95 V
Brady Statistic AP VP Percent: 93 %
Brady Statistic AP VS Percent: 5.6 %
Brady Statistic AS VP Percent: 1 %
Brady Statistic AS VS Percent: 1 %
Brady Statistic RA Percent Paced: 97 %
Date Time Interrogation Session: 20211201020017
HighPow Impedance: 72 Ohm
HighPow Impedance: 72 Ohm
Implantable Lead Implant Date: 20130717
Implantable Lead Implant Date: 20130717
Implantable Lead Implant Date: 20130717
Implantable Lead Location: 753858
Implantable Lead Location: 753859
Implantable Lead Location: 753860
Implantable Lead Model: 4396
Implantable Lead Model: 5076
Implantable Lead Model: 6935
Implantable Pulse Generator Implant Date: 20191129
Lead Channel Impedance Value: 360 Ohm
Lead Channel Impedance Value: 490 Ohm
Lead Channel Impedance Value: 830 Ohm
Lead Channel Pacing Threshold Amplitude: 0.75 V
Lead Channel Pacing Threshold Amplitude: 1 V
Lead Channel Pacing Threshold Amplitude: 1 V
Lead Channel Pacing Threshold Pulse Width: 0.5 ms
Lead Channel Pacing Threshold Pulse Width: 0.5 ms
Lead Channel Pacing Threshold Pulse Width: 0.5 ms
Lead Channel Sensing Intrinsic Amplitude: 2.4 mV
Lead Channel Sensing Intrinsic Amplitude: 8 mV
Lead Channel Setting Pacing Amplitude: 2 V
Lead Channel Setting Pacing Amplitude: 2.25 V
Lead Channel Setting Pacing Amplitude: 2.5 V
Lead Channel Setting Pacing Pulse Width: 0.5 ms
Lead Channel Setting Pacing Pulse Width: 0.5 ms
Lead Channel Setting Sensing Sensitivity: 0.5 mV
Pulse Gen Serial Number: 9812589

## 2020-06-26 NOTE — Telephone Encounter (Signed)
rx refill

## 2020-07-02 NOTE — Progress Notes (Signed)
Remote ICD transmission.   

## 2020-07-15 ENCOUNTER — Telehealth: Payer: Self-pay

## 2020-07-15 NOTE — Telephone Encounter (Signed)
[facility] alert received for 1 VT-1 event on 07/12/20 @ 08:01 AM terminated with ATP X1. Duration 1 minute 4 seconds.   EMR: Amio 200 mg daily, coreg 25 mg BID.  Called patient to assess. No answer, LMOVM.

## 2020-07-15 NOTE — Telephone Encounter (Signed)
Jerry Farley  UTB   could we set him up with me or apps to discuss ablation  Thanks SK

## 2020-07-15 NOTE — Telephone Encounter (Signed)
Returning patients phone call. Patient reports he was not aware of event. Had no symptoms pre or post ATP. Patient aware/voices understanding of shock plan.   Driving restrictions driving restrictions discussed; no driving for 6 months as of 07/12/20. Verbalized understanding.  Advised I will forward to Dr. Caryl Comes and we will call with changes.

## 2020-07-18 ENCOUNTER — Telehealth: Payer: Self-pay

## 2020-07-18 ENCOUNTER — Other Ambulatory Visit: Payer: Self-pay

## 2020-07-18 ENCOUNTER — Telehealth (INDEPENDENT_AMBULATORY_CARE_PROVIDER_SITE_OTHER): Payer: Medicare HMO | Admitting: Internal Medicine

## 2020-07-18 VITALS — Ht 66.0 in | Wt 202.0 lb

## 2020-07-18 DIAGNOSIS — I428 Other cardiomyopathies: Secondary | ICD-10-CM

## 2020-07-18 DIAGNOSIS — I472 Ventricular tachycardia, unspecified: Secondary | ICD-10-CM

## 2020-07-18 DIAGNOSIS — I5022 Chronic systolic (congestive) heart failure: Secondary | ICD-10-CM | POA: Diagnosis not present

## 2020-07-18 MED ORDER — ENTRESTO 24-26 MG PO TABS: 1.0000 | ORAL_TABLET | Freq: Two times a day (BID) | ORAL | 3 refills | Status: DC

## 2020-07-18 NOTE — Progress Notes (Signed)
Electrophysiology TeleHealth Note   Due to national recommendations of social distancing due to COVID 19, an audio/video telehealth visit is felt to be most appropriate for this patient at this time.  See MyChart message from today for the patient's consent to telehealth for [facility].   Date:  07/18/2020   ID:  Jerry Farley, DOB 12-01-1945, MRN [id number]  Location: patient's home  Provider location: [address] [place]  Evaluation Performed: Follow-up visit  PCP:  Jerry Hire, MD  Cardiologist:   Electrophysiologist:  SK   Chief Complaint:     History of Present Illness:    Jerry Farley is a 74 y.o. male who presents via audio/video conferencing for a telehealth visit today.  Since last being seen in our clinic  for ICD implanted for polymorphic ventricular tachycardia   remotely for which he underwent device generator replacement 7/13. ERI and underwent generator replacement 10/19 on amio and adjunctive ranolazine with intercurrent treated VT ( 12/21)   the patient reports *without acute changes in symptoms and unaware of changes In VT  Review of ICD reports, 30 VT-NS 9/21-12/21 with 20 from 06/28/20 with one ATP Last 12/19-2/20 30 VTNS with 7 ATP  Had been on entresto for some of the queit period and then had to stop 2/2 cost   The patient denies chest pain , worsening shortness of breath no orthopnea  or peripheral edema . There have been no palpitations , lightheadedness or syncope.     DATE TEST EF   2007 LHC 25% Cx Stent   2011 myoview   29 % Inferoapical scar  7/14 Echo    40-45% % LAE mild  12/16 Echo   40-45%   9/20 LHC 25-35% Cx stent patent O/w nonobstructive       Date Cr K TSH Hgb LFTs PFTs  11/16    0.48  28    6/17    1.6  30    8/18   4.96 (on synthroid)  30   12/18 1.1 4.8 2.19  33   8/19 1.0 4.6 3.06  32   1/20 1.1 4.4 5.95  30   9/20 1.21 4.3 4.22  41   2/21 1.2 4.4 9.847 ( dose 88>>100)  13.5 37   6/21 1.2` 4.4 6.7                 The patient denies symptoms of fevers, chills, cough, or new SOB worrisome for COVID 19.    Past Medical History:  Diagnosis Date  . 6948-lead   . Arthritis    "maybe a little"  . CAD (coronary artery disease) July 2007   CFX stent (patent Sept '07)  . Cardiac defibrillator CRT-medtronic 01/26/2011  . Chronic systolic congestive heart failure ([place])   . Complication of anesthesia    "problems waking up w/valium"  . High cholesterol   . Hypertension   . Hypothyroidism   . ICD (implantable cardiac defibrillator) in place 2006, 2013   Medtronic Protecta XT-DR CRT (01/2012)  . NICM (nonischemic cardiomyopathy) ([place])    ejection fraction 40-45% by echo July 2012  . Non-ischemic cardiomyopathy- EF 40-45% 2D 7/14 02/11/2012  . Obesity (BMI 30-39.9) 08/22/2013  . Ventricular tachycardia, non-sustained ([place]) 2007   Amiodarone    Past Surgical History:  Procedure Laterality Date  . BI-VENTRICULAR IMPLANTABLE CARDIOVERTER DEFIBRILLATOR UPGRADE N/A 02/10/2012   Procedure: BI-VENTRICULAR IMPLANTABLE CARDIOVERTER DEFIBRILLATOR UPGRADE;  Surgeon: Deboraha Sprang, MD;  Location: [place] CATH LAB;  Service: Cardiovascular;  Laterality: N/A;  . BIV ICD GENERATOR CHANGEOUT N/A 06/24/2018   Procedure: BIV ICD GENERATOR CHANGEOUT;  Surgeon: Deboraha Sprang, MD;  Location: [place] CV LAB;  Service: Cardiovascular;  Laterality: N/A;  . CARDIAC CATHETERIZATION  11/30/2001   non-ischemic cardiomyopahty with high LV end-diastolic pressure; 00% distal L main stenosis, large osital stenosis 70%, 70% Cfx stenosis (Dr. Domenic Moras)  . CARDIAC CATHETERIZATION  9/07   CFX stent patent [employer])  . CARDIAC DEFIBRILLATOR PLACEMENT  09/2004; 02/10/12   ICD implanted in 2006 (Dr. Rito Ehrlich); EOL generator change in 01/2012 - CRT (BiV ICD) (Dr. Caryl Comes)  . CORONARY ANGIOPLASTY WITH STENT PLACEMENT   01/2006   CFX   . LEFT HEART CATH AND CORONARY ANGIOGRAPHY N/A 04/13/2019    Procedure: LEFT HEART CATH AND CORONARY ANGIOGRAPHY;  Surgeon: Jettie Booze, MD;  Location: [place] CV LAB;  Service: Cardiovascular;  Laterality: N/A;  . NM MYOCAR PERF EJECTION FRACTION  05/2010   dipyridamole myoview; mod perfusion defect in apical, basal inferior, mid inferior & apical inferior regions (infarct/scar); post-stress 28%, low risk scan  . TONSILLECTOMY AND ADENOIDECTOMY     "when I was a child"  . TRANSTHORACIC ECHOCARDIOGRAM  01/2013   EF 40-45%, LV mildly dilated, mild LVH, mod hypokinesis of inferior myocardium, grade 1 diastolic dysfunction; mild MR    Current Outpatient Medications  Medication Sig Dispense Refill  . amiodarone (PACERONE) 200 MG tablet Take 0.5 tablets (100 mg total) by mouth daily. 45 tablet 3  . aspirin 81 MG tablet Take 81 mg by mouth daily.    . carvedilol (COREG) 25 MG tablet TAKE 1 TABLET BY MOUTH TWICE DAILY WITH MEALS 180 tablet 3  . clopidogrel (PLAVIX) 75 MG tablet Take 1 tablet by mouth once daily 90 tablet 3  . fish oil-omega-3 fatty acids 1000 MG capsule Take 1 g by mouth daily.    Marland Kitchen levothyroxine (SYNTHROID) 125 MCG tablet Take 1 tablet (125 mcg total) by mouth daily before breakfast. 90 tablet 0  . losartan (COZAAR) 50 MG tablet Take 1 tablet by mouth once daily 90 tablet 0  . Magnesium Oxide 400 MG CAPS Take 400 mg by mouth daily.    . Multiple Vitamin (MULTIVITAMIN) tablet Take 1 tablet by mouth daily.    Marland Kitchen oxymetazoline (AFRIN) 0.05 % nasal spray Place 1 spray into both nostrils 2 (two) times daily as needed for congestion.    . ranolazine (RANEXA) 500 MG 12 hr tablet Take 1 tablet by mouth twice daily 180 tablet 3  . rosuvastatin (CRESTOR) 20 MG tablet Take 1 tablet by mouth once daily 90 tablet 3  . spironolactone (ALDACTONE) 25 MG tablet Take 1/2 (one-half) tablet by mouth once daily 45 tablet 3   No current facility-administered medications for this visit.    Allergies:   Contrast media [iodinated diagnostic agents]    Social History:  The patient  reports that he quit smoking about 16 years ago. His smoking use included cigarettes. He has a 63.00 pack-year smoking history. He has quit using smokeless tobacco.  His smokeless tobacco use included chew. He reports that he does not drink alcohol and does not use drugs.   Family History:  The patient's   family history includes Cancer in his brother and mother.   ROS:  Please see the history of present illness.   All other systems are personally reviewed and negative.    Exam:  Vital Signs:  Ht 5\' 6"  (1.676 m)   Wt 202 lb (91.6 kg)   BMI 32.60 kg/m        Labs/Other Tests and Data Reviewed:    Recent Labs: 05/28/2020: ALT 85; TSH 8.170   Wt Readings from Last 3 Encounters:  07/18/20 202 lb (91.6 kg)  03/11/20 207 lb (93.9 kg)  09/13/19 218 lb 12.8 oz (99.2 kg)     Other studies personally reviewed:    Last device remote is reviewed from [facility] PDF dated 12/21 which reveals normal device function,   arrhythmias - none      ASSESSMENT & PLAN:    Polymorphic ventricular tachycardia-- intercurrent nonsustained episodes  Nonischemic cardiomyopathy  Ventricular tachycardia-monomorphic-paced terminated recurrent   Coronary artery disease with prior stenting  High Risk Medication Surveillance Amiodarone   Syncope  Heart failure-chronic-mixed  CRT-D St Jude from [employer]   Hypothyroidism treated ( dose recently changed from 100>>112)     Discussion regarding the risk of recurrent VT and specifically the role of ablation.  While he has had VTPM originally, mostly he has had VTMM which has been realtively slow, reasonably tolerated but for which he is taking two meds and recurring driving restrictions. He is agreeable to meeting with colleagues to discuss  Also noted a period of electrical quiet and this seemed to track ast least somewhat with the use of entresto which had to be stopped 2/2 cost.  He now thinks that it might  be covered and so will try and replace the losartan and he will let us know if is affordable   We will plan to revisit the ablation strategy later on   COVID 19 screen The patient denies symptoms of COVID 19 at this time.  The importance of social distancing was discussed today.  Follow-up:  43m OV Next remote: As Scheduled   Current medicines are reviewed at length with the patient today.   The patient does not have concerns regarding his medicines.  The following changes were made today: stop losartan Begin entresto 24/26  We will call next week to see if entresto was covered    Labs/ tests ordered today include: none No orders of the defined types were placed in this encounter.   Future tests ( post COVID )    Patient Risk:  after full review of this patients clinical status, I feel that they are at moderate risk at this time.  Today, I have spent 18  minutes with the patient with telehealth technology discussing the above.  Signed, [name], MD  07/18/2020 2:12 PM     [address] [place] [address] [phone] (office) [phone] (fax)

## 2020-07-18 NOTE — Telephone Encounter (Signed)
  Patient Consent for Virtual Visit         Jerry Farley has provided verbal consent on 07/18/2020 for a virtual visit (video or telephone).   CONSENT FOR VIRTUAL VISIT FOR:  Jerry Farley  By participating in this virtual visit I agree to the following:  I hereby voluntarily request, consent and authorize [facility] [place] and its employed or contracted physicians, physician assistants, nurse practitioners or other licensed health care professionals (the Practitioner), to provide me with telemedicine health care services (the "Services") as deemed necessary by the treating Practitioner. I acknowledge and consent to receive the Services by the Practitioner via telemedicine. I understand that the telemedicine visit will involve communicating with the Practitioner through live audiovisual communication technology and the disclosure of certain medical information by electronic transmission. I acknowledge that I have been given the opportunity to request an in-person assessment or other available alternative prior to the telemedicine visit and am voluntarily participating in the telemedicine visit.  I understand that I have the right to withhold or withdraw my consent to the use of telemedicine in the course of my care at any time, without affecting my right to future care or treatment, and that the Practitioner or I may terminate the telemedicine visit at any time. I understand that I have the right to inspect all information obtained and/or recorded in the course of the telemedicine visit and may receive copies of available information for a reasonable fee.  I understand that some of the potential risks of receiving the Services via telemedicine include:  Marland Kitchen Delay or interruption in medical evaluation due to technological equipment failure or disruption; . Information transmitted may not be sufficient (e.g. poor resolution of images) to allow for appropriate medical decision making by the  Practitioner; and/or  . In rare instances, security protocols could fail, causing a breach of personal health information.  Furthermore, I acknowledge that it is my responsibility to provide information about my medical history, conditions and care that is complete and accurate to the best of my ability. I acknowledge that Practitioner's advice, recommendations, and/or decision may be based on factors not within their control, such as incomplete or inaccurate data provided by me or distortions of diagnostic images or specimens that may result from electronic transmissions. I understand that the practice of medicine is not an exact science and that Practitioner makes no warranties or guarantees regarding treatment outcomes. I acknowledge that a copy of this consent can be made available to me via my patient portal ([facility]), or I can request a printed copy by calling the office of [facility].    I understand that my insurance will be billed for this visit.   I have read or had this consent read to me. . I understand the contents of this consent, which adequately explains the benefits and risks of the Services being provided via telemedicine.  . I have been provided ample opportunity to ask questions regarding this consent and the Services and have had my questions answered to my satisfaction. . I give my informed consent for the services to be provided through the use of telemedicine in my medical care

## 2020-07-18 NOTE — Patient Instructions (Addendum)
Medication Instructions:  Your physician has recommended you make the following change in your medication:   ** Stop Losartan ** Begin Entresto 24/26mg  - 1 tablet by mouth twice daily.  *If you need a refill on your cardiac medications before your next appointment, please call your pharmacy*   Lab Work: None ordered.  If you have labs (blood work) drawn today and your tests are completely normal, you will receive your results only by:  [place] (if you have MyChart) OR  A paper copy in the mail If you have any lab test that is abnormal or we need to change your treatment, we will call you to review the results.   Testing/Procedures: None ordered.    Follow-Up: At [facility], you and your health needs are our priority.  As part of our continuing mission to provide you with exceptional heart care, we have created designated Provider Care Teams.  These Care Teams include your primary Cardiologist (physician) and Advanced Practice Providers (APPs -  Physician Assistants and Nurse Practitioners) who all work together to provide you with the care you need, when you need it.  We recommend signing up for the patient portal called "MyChart".  Sign up information is provided on this After Visit Summary.  MyChart is used to connect with patients for Virtual Visits (Telemedicine).  Patients are able to view lab/test results, encounter notes, upcoming appointments, etc.  Non-urgent messages can be sent to your provider as well.   To learn more about what you can do with MyChart, go to [email or web address].    Your next appointment:   2 months with Dr Caryl Comes  09/25/2020 at 915am  Reviewed instructions with pt via phone.  Pt verbalizes understanding and will call office next week if he has any problems getting Entresto.

## 2020-08-07 ENCOUNTER — Other Ambulatory Visit: Payer: Medicare HMO

## 2020-08-07 ENCOUNTER — Other Ambulatory Visit: Payer: Self-pay

## 2020-08-07 ENCOUNTER — Encounter (INDEPENDENT_AMBULATORY_CARE_PROVIDER_SITE_OTHER): Payer: Self-pay

## 2020-08-07 DIAGNOSIS — Z79899 Other long term (current) drug therapy: Secondary | ICD-10-CM | POA: Diagnosis not present

## 2020-08-07 DIAGNOSIS — E032 Hypothyroidism due to medicaments and other exogenous substances: Secondary | ICD-10-CM

## 2020-08-07 DIAGNOSIS — I472 Ventricular tachycardia: Secondary | ICD-10-CM | POA: Diagnosis not present

## 2020-08-07 DIAGNOSIS — I4729 Other ventricular tachycardia: Secondary | ICD-10-CM

## 2020-08-07 LAB — TSH: TSH: 3.85 u[IU]/mL (ref 0.450–4.500)

## 2020-08-07 LAB — HEPATIC FUNCTION PANEL
ALT: 94 IU/L — ABNORMAL HIGH (ref 0–44)
AST: 109 IU/L — ABNORMAL HIGH (ref 0–40)
Albumin: 4.1 g/dL (ref 3.7–4.7)
Alkaline Phosphatase: 62 IU/L (ref 44–121)
Bilirubin Total: 0.4 mg/dL (ref 0.0–1.2)
Bilirubin, Direct: 0.15 mg/dL (ref 0.00–0.40)
Total Protein: 6.2 g/dL (ref 6.0–8.5)

## 2020-09-06 ENCOUNTER — Telehealth: Payer: Self-pay

## 2020-09-06 NOTE — Telephone Encounter (Signed)
Patient calling back to speak with Marsha. ?

## 2020-09-06 NOTE — Telephone Encounter (Signed)
Attempted phone call to pt.  Left voicemail message to contact RN at [phone]. 

## 2020-09-06 NOTE — Telephone Encounter (Signed)
Spoke with pt and advised per Dr Caryl Comes labs are abnormal.  TSH is improved but some of his liver enzymes are elevated.  Dr Caryl Comes suggests a referral to GI for further evaluation.  Pt advised to stop his Amiodarone for now.   Pt states he has a PCP appointment on 02/23 and will discuss with him re: referral and possibly repeating lab work.  Pt will contact Dr Caryl Comes with PCP recommendations.  Pt verbalizes he will stop Amio for now and thanked Therapist, sports.

## 2020-09-06 NOTE — Telephone Encounter (Signed)
-----   Message from Deboraha Sprang, MD sent at 09/03/2020  1:50 PM EST -----  Please inform patient that drug surveillance labs are abnormal  We need to have him hold his amio and perhaps we can get him a GI consult to look for other causes as w hx of VT we may be challenged in [place] stopping it His TSH is now better Thanks SK

## 2020-09-09 NOTE — Telephone Encounter (Signed)
Spoke with pt's wife who states pt is not currently at home.  Pt' wife states she will let pt know of call but requests RN call back.

## 2020-09-18 DIAGNOSIS — Z125 Encounter for screening for malignant neoplasm of prostate: Secondary | ICD-10-CM | POA: Diagnosis not present

## 2020-09-18 DIAGNOSIS — E034 Atrophy of thyroid (acquired): Secondary | ICD-10-CM | POA: Diagnosis not present

## 2020-09-18 DIAGNOSIS — I2510 Atherosclerotic heart disease of native coronary artery without angina pectoris: Secondary | ICD-10-CM | POA: Diagnosis not present

## 2020-09-19 ENCOUNTER — Other Ambulatory Visit: Payer: Self-pay | Admitting: Internal Medicine

## 2020-09-20 NOTE — Telephone Encounter (Signed)
Spoke with pt who states he needs a refill on his thyroid medication as he only has 3 tablets left.  Refill sent to pharmacy on file.

## 2020-09-25 ENCOUNTER — Ambulatory Visit: Payer: Medicare HMO | Admitting: Internal Medicine

## 2020-09-25 ENCOUNTER — Encounter: Payer: Self-pay | Admitting: Internal Medicine

## 2020-09-25 ENCOUNTER — Other Ambulatory Visit: Payer: Self-pay

## 2020-09-25 ENCOUNTER — Ambulatory Visit (INDEPENDENT_AMBULATORY_CARE_PROVIDER_SITE_OTHER): Payer: Medicare HMO

## 2020-09-25 VITALS — BP 92/66 | HR 70 | Ht 67.0 in | Wt 202.0 lb

## 2020-09-25 DIAGNOSIS — I428 Other cardiomyopathies: Secondary | ICD-10-CM | POA: Diagnosis not present

## 2020-09-25 DIAGNOSIS — I472 Ventricular tachycardia, unspecified: Secondary | ICD-10-CM

## 2020-09-25 DIAGNOSIS — I5022 Chronic systolic (congestive) heart failure: Secondary | ICD-10-CM

## 2020-09-25 DIAGNOSIS — Z95810 Presence of automatic (implantable) cardiac defibrillator: Secondary | ICD-10-CM | POA: Diagnosis not present

## 2020-09-25 LAB — CUP PACEART REMOTE DEVICE CHECK
Battery Remaining Longevity: 47 mo
Battery Remaining Percentage: 64 %
Battery Voltage: 2.95 V
Brady Statistic AP VP Percent: 94 %
Brady Statistic AP VS Percent: 4.8 %
Brady Statistic AS VP Percent: 1 %
Brady Statistic AS VS Percent: 1 %
Brady Statistic RA Percent Paced: 97 %
Date Time Interrogation Session: 20220302020017
HighPow Impedance: 70 Ohm
HighPow Impedance: 70 Ohm
Implantable Lead Implant Date: 20130717
Implantable Lead Implant Date: 20130717
Implantable Lead Implant Date: 20130717
Implantable Lead Location: 753858
Implantable Lead Location: 753859
Implantable Lead Location: 753860
Implantable Lead Model: 4396
Implantable Lead Model: 5076
Implantable Lead Model: 6935
Implantable Pulse Generator Implant Date: 20191129
Lead Channel Impedance Value: 360 Ohm
Lead Channel Impedance Value: 450 Ohm
Lead Channel Impedance Value: 700 Ohm
Lead Channel Pacing Threshold Amplitude: 0.75 V
Lead Channel Pacing Threshold Amplitude: 1 V
Lead Channel Pacing Threshold Amplitude: 1 V
Lead Channel Pacing Threshold Pulse Width: 0.5 ms
Lead Channel Pacing Threshold Pulse Width: 0.5 ms
Lead Channel Pacing Threshold Pulse Width: 0.5 ms
Lead Channel Sensing Intrinsic Amplitude: 11.2 mV
Lead Channel Sensing Intrinsic Amplitude: 2.4 mV
Lead Channel Setting Pacing Amplitude: 2 V
Lead Channel Setting Pacing Amplitude: 2.25 V
Lead Channel Setting Pacing Amplitude: 2.5 V
Lead Channel Setting Pacing Pulse Width: 0.5 ms
Lead Channel Setting Pacing Pulse Width: 0.5 ms
Lead Channel Setting Sensing Sensitivity: 0.5 mV
Pulse Gen Serial Number: 9812589

## 2020-09-25 LAB — HEPATIC FUNCTION PANEL
ALT: 67 IU/L — ABNORMAL HIGH (ref 0–44)
AST: 70 IU/L — ABNORMAL HIGH (ref 0–40)
Albumin: 4.2 g/dL (ref 3.7–4.7)
Alkaline Phosphatase: 61 IU/L (ref 44–121)
Bilirubin Total: 0.6 mg/dL (ref 0.0–1.2)
Bilirubin, Direct: 0.17 mg/dL (ref 0.00–0.40)
Total Protein: 6.5 g/dL (ref 6.0–8.5)

## 2020-09-25 NOTE — Progress Notes (Signed)
Patient Care Team: Baxter Hire, MD as PCP - General (Internal Medicine)   HPI  Jerry Farley is a 75 y.o. male Seen in followup for ICD implanted for polymorphic ventricular tachycardia   remotely for which he underwent device generator replacement 7/13. ERI and underwent generator replacement 10/19 and for reasons that I do not recall received a St Jude replacement of a Medtronic device  On Amiodarone and adjunctive ranolazine   History of 6949-lead which was revised the time of CRT upgrade receiving an atrial lead and an LV lead.   coronary artery disease with prior stenting of his circumflex.   Recurrent VT and discussions were had 12/21 regarding catheter ablation for the recurrent events.  There have also been some concern that the Entresto discontinuation associated with cost was temporally associated with the recurrent ventricular tachycardia.  He was to resume.  Unfortunately, surveillance laboratories on amiodarone demonstrated elevated transaminases and his amiodarone was held.   The patient denies chest pain      DATE TEST EF   2007 LHC 25% Cx Stent   2011 myoview   29 % Inferoapical scar  7/14 Echo    40-45% % LAE mild  12/16 Echo   40-45%   9/19 LHC 25-35% Cx stent patent O/w nonobstructive       Date Cr K TSH Hgb LFTs PFTs  11/16    0.48  28    6/17    1.6  30    8/18   4.96 (on synthroid)  30   12/18 1.1 4.8 2.19  33   8/19 1.0 4.6 3.06  32   1/20 1.1 4.4 5.95  30   9/20 1.21 4.3 4.22  41   2/21 1.2 4.4 9.847 ( dose 88>>100) 13.5 37   6/21 1.2` 4.4 6.7     1/22   3.85<<8.17  109<<88         Past Medical History:  Diagnosis Date  . 6948-lead   . Arthritis    "maybe a little"  . CAD (coronary artery disease) July 2007   CFX stent (patent Sept '07)  . Cardiac defibrillator CRT-medtronic 01/26/2011  . Chronic systolic congestive heart failure ([place])   . Complication of anesthesia    "problems waking up w/valium"  . High cholesterol    . Hypertension   . Hypothyroidism   . ICD (implantable cardiac defibrillator) in place 2006, 2013   Medtronic Protecta XT-DR CRT (01/2012)  . NICM (nonischemic cardiomyopathy) ([place])    ejection fraction 40-45% by echo July 2012  . Non-ischemic cardiomyopathy- EF 40-45% 2D 7/14 02/11/2012  . Obesity (BMI 30-39.9) 08/22/2013  . Ventricular tachycardia, non-sustained ([place]) 2007   Amiodarone    Past Surgical History:  Procedure Laterality Date  . BI-VENTRICULAR IMPLANTABLE CARDIOVERTER DEFIBRILLATOR UPGRADE N/A 02/10/2012   Procedure: BI-VENTRICULAR IMPLANTABLE CARDIOVERTER DEFIBRILLATOR UPGRADE;  Surgeon: Deboraha Sprang, MD;  Location: [facility] CATH LAB;  Service: Cardiovascular;  Laterality: N/A;  . BIV ICD GENERATOR CHANGEOUT N/A 06/24/2018   Procedure: BIV ICD GENERATOR CHANGEOUT;  Surgeon: Deboraha Sprang, MD;  Location: [facility] CV LAB;  Service: Cardiovascular;  Laterality: N/A;  . CARDIAC CATHETERIZATION  11/30/2001   non-ischemic cardiomyopahty with high LV end-diastolic pressure; 36% distal L main stenosis, large osital stenosis 70%, 70% Cfx stenosis (Dr. Domenic Moras)  . CARDIAC CATHETERIZATION  9/07   CFX stent patent [employer])  . CARDIAC DEFIBRILLATOR PLACEMENT  09/2004; 02/10/12   ICD implanted in  2006 (Dr. Rito Ehrlich); EOL generator change in 01/2012 - CRT (BiV ICD) (Dr. Caryl Comes)  . CORONARY ANGIOPLASTY WITH STENT PLACEMENT   01/2006   CFX   . LEFT HEART CATH AND CORONARY ANGIOGRAPHY N/A 04/13/2019   Procedure: LEFT HEART CATH AND CORONARY ANGIOGRAPHY;  Surgeon: Jettie Booze, MD;  Location: [facility] CV LAB;  Service: Cardiovascular;  Laterality: N/A;  . NM MYOCAR PERF EJECTION FRACTION  05/2010   dipyridamole myoview; mod perfusion defect in apical, basal inferior, mid inferior & apical inferior regions (infarct/scar); post-stress 28%, low risk scan  . TONSILLECTOMY AND ADENOIDECTOMY     "when I was a child"  . TRANSTHORACIC ECHOCARDIOGRAM  01/2013   EF 40-45%, LV mildly dilated,  mild LVH, mod hypokinesis of inferior myocardium, grade 1 diastolic dysfunction; mild MR    Current Outpatient Medications  Medication Sig Dispense Refill  . aspirin 81 MG tablet Take 81 mg by mouth daily.    . carvedilol (COREG) 25 MG tablet TAKE 1 TABLET BY MOUTH TWICE DAILY WITH MEALS 180 tablet 3  . clopidogrel (PLAVIX) 75 MG tablet Take 1 tablet by mouth once daily 90 tablet 3  . EUTHYROX 125 MCG tablet TAKE 1 TABLET BY MOUTH ONCE DAILY BEFORE BREAKFAST 90 tablet 0  . fish oil-omega-3 fatty acids 1000 MG capsule Take 1 g by mouth daily.    . Magnesium Oxide 400 MG CAPS Take 400 mg by mouth daily.    . Multiple Vitamin (MULTIVITAMIN) tablet Take 1 tablet by mouth daily.    Marland Kitchen oxymetazoline (AFRIN) 0.05 % nasal spray Place 1 spray into both nostrils 2 (two) times daily as needed for congestion.    . ranolazine (RANEXA) 500 MG 12 hr tablet Take 1 tablet by mouth twice daily 180 tablet 3  . rosuvastatin (CRESTOR) 20 MG tablet Take 1 tablet by mouth once daily 90 tablet 3  . sacubitril-valsartan (ENTRESTO) 24-26 MG Take 1 tablet by mouth 2 (two) times daily. 180 tablet 3  . spironolactone (ALDACTONE) 25 MG tablet Take 1/2 (one-half) tablet by mouth once daily 45 tablet 3   No current facility-administered medications for this visit.    Allergies  Allergen Reactions  . Contrast Media [Iodinated Diagnostic Agents] Other (See Comments)    Patient states "I don't remember exactly what happens but I know I cannot have contrast for testing."     Review of Systems negative except from HPI and PMH  Physical Exam BP 92/66   Pulse 70   Ht 5\' 7"  (1.702 m)   Wt 202 lb (91.6 kg)   SpO2 96%   BMI 31.64 kg/m  Well developed and well nourished in no acute distress HENT normal Neck supple with JVP-flat Clear Device pocket well healed; without hematoma or erythema.  There is no tethering  Regular rate and rhythm, no   murmur Abd-soft with active BS No Clubbing cyanosis   edema Skin-warm and  dry A & Oriented  Grossly normal sensory and motor function  ECG AV pacing at 78 Intervals 16/2 20/48 Upright QRS lead V1 negative QRS lead I   No intercurrent VT since 07/22/19  Assessment and  Plan  Polymorphic ventricular tachycardia-- intercurrent nonsustained episodes  Nonischemic cardiomyopathy  Ventricular tachycardia-monomorphic-paced terminated  Coronary artery disease with prior stenting  High Risk Medication Surveillance Amiodarone  Elevated transaminases   Syncope  Heart failure-chronic-mixed  CRT-D St Jude from Medtronic   Hypothyroidism treated ( dose recently changed from 100>>112)   We have had to  discontinue his amiodarone because of elevated transaminases.  We will recheck them today.  This prompts the opportunity to go back and review his history of ventricular tachycardia* and to consider alternatives.  Review of the electrograms from 12/21 demonstrated monomorphic ventricular tachycardia raising the possibility as to whether he is a candidate for catheter ablation as opposed antiarrhythmic drug suppression  Back taking amiodarone 100 mg a day ( on 5/19  Report shows pace terminated VT)    Currently euvolemic.  No chest pain.

## 2020-09-25 NOTE — Patient Instructions (Signed)
Medication Instructions:  Your physician recommends that you continue on your current medications as directed. Please refer to the Current Medication list given to you today.  *If you need a refill on your cardiac medications before your next appointment, please call your pharmacy*   Lab Work: Liver Panel  If you have labs (blood work) drawn today and your tests are completely normal, you will receive your results only by: Marland Kitchen MyChart Message (if you have MyChart) OR . A paper copy in the mail If you have any lab test that is abnormal or we need to change your treatment, we will call you to review the results.   Testing/Procedures: None ordered.    Follow-Up: At [facility], you and your health needs are our priority.  As part of our continuing mission to provide you with exceptional heart care, we have created designated Provider Care Teams.  These Care Teams include your primary Cardiologist (physician) and Advanced Practice Providers (APPs -  Physician Assistants and Nurse Practitioners) who all work together to provide you with the care you need, when you need it.  We recommend signing up for the patient portal called "MyChart".  Sign up information is provided on this After Visit Summary.  MyChart is used to connect with patients for Virtual Visits (Telemedicine).  Patients are able to view lab/test results, encounter notes, upcoming appointments, etc.  Non-urgent messages can be sent to your provider as well.   To learn more about what you can do with MyChart, go to [email or web address].    Your next appointment:   6 month(s)  The format for your next appointment:   In Person  Provider:   [name], MD

## 2020-09-26 DIAGNOSIS — Z1211 Encounter for screening for malignant neoplasm of colon: Secondary | ICD-10-CM | POA: Diagnosis not present

## 2020-09-26 DIAGNOSIS — Z95810 Presence of automatic (implantable) cardiac defibrillator: Secondary | ICD-10-CM | POA: Diagnosis not present

## 2020-09-26 DIAGNOSIS — R7989 Other specified abnormal findings of blood chemistry: Secondary | ICD-10-CM | POA: Diagnosis not present

## 2020-09-26 DIAGNOSIS — E034 Atrophy of thyroid (acquired): Secondary | ICD-10-CM | POA: Diagnosis not present

## 2020-09-26 DIAGNOSIS — Z0000 Encounter for general adult medical examination without abnormal findings: Secondary | ICD-10-CM | POA: Diagnosis not present

## 2020-09-26 DIAGNOSIS — Z0001 Encounter for general adult medical examination with abnormal findings: Secondary | ICD-10-CM | POA: Diagnosis not present

## 2020-09-26 DIAGNOSIS — E7800 Pure hypercholesterolemia, unspecified: Secondary | ICD-10-CM | POA: Diagnosis not present

## 2020-09-26 DIAGNOSIS — I2510 Atherosclerotic heart disease of native coronary artery without angina pectoris: Secondary | ICD-10-CM | POA: Diagnosis not present

## 2020-09-26 NOTE — Progress Notes (Signed)
Patient Care Team: Baxter Hire, MD as PCP - General (Internal Medicine)   HPI  Jerry Farley is a 75 y.o. male Seen in followup for ICD implanted for polymorphic ventricular tachycardia   remotely for which he underwent device generator replacement 7/13. ERI and underwent generator replacement 10/19 and for reasons that I do not recall received a St Jude replacement of a Medtronic device  On Amiodarone and adjunctive ranolazine   History of 6949-lead which was revised the time of CRT upgrade receiving an atrial lead and an LV lead.   coronary artery disease with prior stenting of his circumflex.   Recurrent VT and discussions were had 12/21 regarding catheter ablation for the recurrent events.  There have also been some concern that the Entresto discontinuation associated with cost was temporally associated with the recurrent ventricular tachycardia.  He was to resume.  Unfortunately, surveillance laboratories on amiodarone demonstrated elevated transaminases and his amiodarone has been  held.   The patient denies chest pain, chronic mild shortness of breath , nocturnal dyspnea , orthopnea; some  peripheral edema .  There have been no palpitations , lightheadedness  or syncope .     DATE TEST EF   2007 LHC 25% Cx Stent   2011 myoview   29 % Inferoapical scar  7/14 Echo    40-45% % LAE mild  12/16 Echo   40-45%   9/19 LHC 25-35% Cx stent patent O/w nonobstructive       Date Cr K TSH Hgb LFTs PFTs  11/16    0.48  28    6/17    1.6  30    8/18   4.96 (on synthroid)  30   12/18 1.1 4.8 2.19  33   8/19 1.0 4.6 3.06  32   1/20 1.1 4.4 5.95  30   9/20 1.21 4.3 4.22  41   2/21 1.2 4.4 9.847 ( dose 88>>100) 13.5 37   6/21 1.2` 4.4 6.7     1/22   3.85<<8.17  109<<88         Past Medical History:  Diagnosis Date  . 6948-lead   . Arthritis    "maybe a little"  . CAD (coronary artery disease) July 2007   CFX stent (patent Sept '07)  . Cardiac defibrillator  CRT-medtronic 01/26/2011  . Chronic systolic congestive heart failure ([place])   . Complication of anesthesia    "problems waking up w/valium"  . High cholesterol   . Hypertension   . Hypothyroidism   . ICD (implantable cardiac defibrillator) in place 2006, 2013   Medtronic Protecta XT-DR CRT (01/2012)  . NICM (nonischemic cardiomyopathy) ([place])    ejection fraction 40-45% by echo July 2012  . Non-ischemic cardiomyopathy- EF 40-45% 2D 7/14 02/11/2012  . Obesity (BMI 30-39.9) 08/22/2013  . Ventricular tachycardia, non-sustained ([place]) 2007   Amiodarone    Past Surgical History:  Procedure Laterality Date  . BI-VENTRICULAR IMPLANTABLE CARDIOVERTER DEFIBRILLATOR UPGRADE N/A 02/10/2012   Procedure: BI-VENTRICULAR IMPLANTABLE CARDIOVERTER DEFIBRILLATOR UPGRADE;  Surgeon: Deboraha Sprang, MD;  Location: [facility] CATH LAB;  Service: Cardiovascular;  Laterality: N/A;  . BIV ICD GENERATOR CHANGEOUT N/A 06/24/2018   Procedure: BIV ICD GENERATOR CHANGEOUT;  Surgeon: Deboraha Sprang, MD;  Location: [place] CV LAB;  Service: Cardiovascular;  Laterality: N/A;  . CARDIAC CATHETERIZATION  11/30/2001   non-ischemic cardiomyopahty with high LV end-diastolic pressure; 83% distal L main stenosis, large osital stenosis 70%, 70% Cfx stenosis (  Dr. Domenic Moras)  . CARDIAC CATHETERIZATION  9/07   CFX stent patent [employer])  . CARDIAC DEFIBRILLATOR PLACEMENT  09/2004; 02/10/12   ICD implanted in 2006 (Dr. Rito Ehrlich); EOL generator change in 01/2012 - CRT (BiV ICD) (Dr. Caryl Comes)  . CORONARY ANGIOPLASTY WITH STENT PLACEMENT   01/2006   CFX   . LEFT HEART CATH AND CORONARY ANGIOGRAPHY N/A 04/13/2019   Procedure: LEFT HEART CATH AND CORONARY ANGIOGRAPHY;  Surgeon: Jettie Booze, MD;  Location: [facility] CV LAB;  Service: Cardiovascular;  Laterality: N/A;  . NM MYOCAR PERF EJECTION FRACTION  05/2010   dipyridamole myoview; mod perfusion defect in apical, basal inferior, mid inferior & apical inferior regions (infarct/scar);  post-stress 28%, low risk scan  . TONSILLECTOMY AND ADENOIDECTOMY     "when I was a child"  . TRANSTHORACIC ECHOCARDIOGRAM  01/2013   EF 40-45%, LV mildly dilated, mild LVH, mod hypokinesis of inferior myocardium, grade 1 diastolic dysfunction; mild MR    Current Outpatient Medications  Medication Sig Dispense Refill  . aspirin 81 MG tablet Take 81 mg by mouth daily.    . carvedilol (COREG) 25 MG tablet TAKE 1 TABLET BY MOUTH TWICE DAILY WITH MEALS 180 tablet 3  . clopidogrel (PLAVIX) 75 MG tablet Take 1 tablet by mouth once daily 90 tablet 3  . EUTHYROX 125 MCG tablet TAKE 1 TABLET BY MOUTH ONCE DAILY BEFORE BREAKFAST 90 tablet 0  . fish oil-omega-3 fatty acids 1000 MG capsule Take 1 g by mouth daily.    . Magnesium Oxide 400 MG CAPS Take 400 mg by mouth daily.    . Multiple Vitamin (MULTIVITAMIN) tablet Take 1 tablet by mouth daily.    Marland Kitchen oxymetazoline (AFRIN) 0.05 % nasal spray Place 1 spray into both nostrils 2 (two) times daily as needed for congestion.    . ranolazine (RANEXA) 500 MG 12 hr tablet Take 1 tablet by mouth twice daily 180 tablet 3  . rosuvastatin (CRESTOR) 20 MG tablet Take 1 tablet by mouth once daily 90 tablet 3  . sacubitril-valsartan (ENTRESTO) 24-26 MG Take 1 tablet by mouth 2 (two) times daily. 180 tablet 3  . spironolactone (ALDACTONE) 25 MG tablet Take 1/2 (one-half) tablet by mouth once daily 45 tablet 3   No current facility-administered medications for this visit.    Allergies  Allergen Reactions  . Contrast Media [Iodinated Diagnostic Agents] Other (See Comments)    Patient states "I don't remember exactly what happens but I know I cannot have contrast for testing."     Review of Systems negative except from HPI and PMH  Physical Exam BP 92/66   Pulse 70   Ht 5\' 7"  (1.702 m)   Wt 202 lb (91.6 kg)   SpO2 96%   BMI 31.64 kg/m  Well developed and well nourished in no acute distress HENT normal Neck supple with JVP-flat Clear Device pocket well  healed; without hematoma or erythema.  There is no tethering  Regular rate and rhythm, no   murmur Abd-soft with active BS No Clubbing cyanosis   edema Skin-warm and dry A & Oriented  Grossly normal sensory and motor function  ECG AV pacing at 78 Intervals 16/2 20/48 Upright QRS lead V1 negative QRS lead I   No intercurrent VT since 07/22/19  Assessment and  Plan  Polymorphic ventricular tachycardia-- intercurrent nonsustained episodes  Nonischemic cardiomyopathy  Ventricular tachycardia-monomorphic-paced terminated  Coronary artery disease with prior stenting  High Risk Medication Surveillance Amiodarone  Elevated transaminases  ?  Amio    Syncope  Heart failure-chronic-mixed  CRT-D St Jude from Medtronic   Hypothyroidism treated ( dose recently changed from 100>>112)   We have had to discontinue his amiodarone because of elevated transaminases.  We will recheck labs today.  This prompts the opportunity to go back and review his history of ventricular tachycardia and to consider alternatives.  Review of the electrograms from 12/21 demonstrated monomorphic ventricular tachycardia raising the possibility as to whether he is a candidate for catheter ablation as opposed antiarrhythmic drug suppression  Currently euvolemic.  No chest pain.

## 2020-10-03 ENCOUNTER — Telehealth: Payer: Self-pay

## 2020-10-03 NOTE — Progress Notes (Signed)
Remote ICD transmission.   

## 2020-10-03 NOTE — Telephone Encounter (Signed)
Pt's PCP has reviewed labs with elevated LFT's and is following. His plan as noted in [facility] is as follows: Elevated LFTs. His cardiologist rechecked him at yesterday's visit I reviewed those labs and they are still elevated. Could be as Crestor. We will go to hold this for 2 weeks and recheck his labs. If the go back to normal then will have to switch his cholesterol medication.  Spoke with pt who states he has not been told by his PCP to hold Crestor but does have a follow lab appointment scheduled for next week.   Pt advised to contact his PCP to clarify if he does indeed need to hold Crestor.  Pt verbalizes understanding and agrees with current plan.

## 2020-10-07 DIAGNOSIS — Z1211 Encounter for screening for malignant neoplasm of colon: Secondary | ICD-10-CM | POA: Diagnosis not present

## 2020-10-10 NOTE — Telephone Encounter (Addendum)
Attempted phone call to pt to ensure he has stopped Crestor. Pt's wife states pt currently with GI "bug and cannot talk right now.  She requests RN contact pt another time to allow pt time to recover.

## 2020-10-10 NOTE — Telephone Encounter (Signed)
M  thx   The issue will be whether it is crestor or amio  Lets have him stop crestor  Paper suggests statin caused elevated LFTs should resolve by 6-8 weeks

## 2020-10-12 LAB — COLOGUARD: COLOGUARD: NEGATIVE

## 2020-10-15 NOTE — Telephone Encounter (Signed)
Spoke with pt to ensure he is holding his Crestor.  Pt states he believes he is not taking medication and he will return to his PCP office to have LFT's rechecked once he is feeling better as he has had the "stomach virus"

## 2020-10-17 ENCOUNTER — Other Ambulatory Visit: Payer: Self-pay | Admitting: Internal Medicine

## 2020-10-28 DIAGNOSIS — R7989 Other specified abnormal findings of blood chemistry: Secondary | ICD-10-CM | POA: Diagnosis not present

## 2020-10-30 ENCOUNTER — Other Ambulatory Visit: Payer: Self-pay | Admitting: Internal Medicine

## 2020-10-30 DIAGNOSIS — R7989 Other specified abnormal findings of blood chemistry: Secondary | ICD-10-CM

## 2020-10-31 NOTE — Telephone Encounter (Signed)
Still elevated  We can follow the evaluation

## 2020-10-31 NOTE — Telephone Encounter (Signed)
Pt's PCP continues to follow elevated liver enzymes and has ordered an US of the liver to further evaluate.

## 2020-11-01 ENCOUNTER — Ambulatory Visit
Admission: RE | Admit: 2020-11-01 | Discharge: 2020-11-01 | Disposition: A | Discharge status: Home / Self Care | Payer: Medicare HMO | Source: Ambulatory Visit | Attending: Internal Medicine | Admitting: Internal Medicine

## 2020-11-01 ENCOUNTER — Other Ambulatory Visit: Payer: Self-pay

## 2020-11-01 DIAGNOSIS — R7989 Other specified abnormal findings of blood chemistry: Secondary | ICD-10-CM

## 2020-11-01 DIAGNOSIS — K760 Fatty (change of) liver, not elsewhere classified: Secondary | ICD-10-CM | POA: Diagnosis not present

## 2020-11-15 ENCOUNTER — Telehealth: Payer: Self-pay

## 2020-11-15 NOTE — Telephone Encounter (Signed)
Successful telephone encounter to [place] Dam[facility] Dunnings after [facility] alert received for 2 recent VTevents 11/14/20 that were successfully treated with ATPx3.    "Alert remote transmission reviewed.  2 VT-1 events w/ total of 3 rounds of successful ATP delivered average rate 120-140bpm.  There were also several consecutive NSVT events  logged events no EGMs available.  Multiple PMT events EGMs appear algorithm appropriate.  Sending to triage for further review.  R. Powers, CVRS"  Patient states he was unaware of any "fast beats". He is unsure what he was doing during or prior to events. He is compliant with his medications including Coreg, Plavix, Ranexa, Entresto, and Spironolactone however states he just resumed his Amiodarone 11/03/20 after being held for abnormal labs. CorVue reflects fluid retension. Patient denies s/s of fluid overload however he does feel his "belly may be a little bigger". Patient does not weight daily and does not limit high sodium foods such as sausage which he had this morning for breakfast. HF action plan reviewed and encouraged to limit high sodium processed foods. Shock plan reviewed and patient advised of [place] DMV law for ICD therapy. Will forward to EP for review.

## 2020-11-19 ENCOUNTER — Other Ambulatory Visit: Payer: Self-pay | Admitting: Internal Medicine

## 2020-12-12 ENCOUNTER — Telehealth: Payer: Self-pay | Admitting: Emergency Medicine

## 2020-12-12 DIAGNOSIS — Z79899 Other long term (current) drug therapy: Secondary | ICD-10-CM

## 2020-12-12 DIAGNOSIS — I472 Ventricular tachycardia, unspecified: Secondary | ICD-10-CM

## 2020-12-12 NOTE — Telephone Encounter (Signed)
Impressive burden of new onset VT beginning May 16 and then clustering on May 18 with 32 episodes of treated ventricular tachycardia.  I spoke with the patient earlier without the information following up with him about his liver function tests and he had it on low-dose amiodarone.  We will have him increase it again to 400 twice daily.  Have him come up for blood work to exclude coronary ischemia.

## 2020-12-12 NOTE — Telephone Encounter (Signed)
Can we order cmet and troponin for Jerry Farley   He lives closer to there than [place]  .  The patient denies SOB, chest pain edema or palpitations.  There has been no syncope or LH.   Will increase amio to 400 bid until it becomes clearer

## 2020-12-12 NOTE — Telephone Encounter (Signed)
LMOM to call Device clinic with #. Alert received for multiple episodes of VT  That fell in VT-1 Zone that terminated with ATP on 12/11/20. Meds include Coreg 25 mg BID, Entresto 24-26 mg BID. Assess for s/sx give driving restrictions.

## 2020-12-12 NOTE — Telephone Encounter (Signed)
Patient reports he had no CP, chest pressure, SOB, dizziness or syncope on 12/11/20.He reports feeling a little" hazy" intermittent;y. Patient reports he is taking Pacerone 200 mg 1/2 tablet daily in addition to other meds listed. He reports he stopped the [place] for 14 days after speaking with Alford Highland RN on 09/06/20 and thought he was to resume the medication 2 weeks later. Patient made aware of [place] DMV driving restrictions and verbalized understanding of restrictions. ED precautions given.

## 2020-12-12 NOTE — Telephone Encounter (Signed)
The patient is returning nurse call. I let him speak with Cindy, rn.

## 2020-12-13 ENCOUNTER — Other Ambulatory Visit
Admission: RE | Admit: 2020-12-13 | Discharge: 2020-12-13 | Disposition: A | Discharge status: Home / Self Care | Payer: Medicare HMO | Attending: Internal Medicine | Admitting: Internal Medicine

## 2020-12-13 DIAGNOSIS — I472 Ventricular tachycardia, unspecified: Secondary | ICD-10-CM

## 2020-12-13 DIAGNOSIS — Z79899 Other long term (current) drug therapy: Secondary | ICD-10-CM | POA: Diagnosis not present

## 2020-12-13 LAB — COMPREHENSIVE METABOLIC PANEL
ALT: 40 U/L (ref 0–44)
AST: 44 U/L — ABNORMAL HIGH (ref 15–41)
Albumin: 4 g/dL (ref 3.5–5.0)
Alkaline Phosphatase: 44 U/L (ref 38–126)
Anion gap: 8 (ref 5–15)
BUN: 17 mg/dL (ref 8–23)
CO2: 27 mmol/L (ref 22–32)
Calcium: 9.3 mg/dL (ref 8.9–10.3)
Chloride: 105 mmol/L (ref 98–111)
Creatinine, Ser: 1.18 mg/dL (ref 0.61–1.24)
GFR, Estimated: >60 mL/min (ref >60)
Glucose, Bld: 106 mg/dL — ABNORMAL HIGH (ref 70–99)
Potassium: 4.5 mmol/L (ref 3.5–5.1)
Sodium: 140 mmol/L (ref 135–145)
Total Bilirubin: 0.8 mg/dL (ref 0.3–1.2)
Total Protein: 6.6 g/dL (ref 6.5–8.1)

## 2020-12-13 LAB — MAGNESIUM: Magnesium: 2.3 mg/dL (ref 1.7–2.4)

## 2020-12-13 LAB — TROPONIN I (HIGH SENSITIVITY): Troponin I (High Sensitivity): 8 ng/L (ref <18)

## 2020-12-13 MED ORDER — AMIODARONE HCL 200 MG PO TABS: 400.0000 mg | ORAL_TABLET | Freq: Two times a day (BID) | ORAL | Status: DC

## 2020-12-13 NOTE — Telephone Encounter (Signed)
I called and spoke with the patient. I have advised him I received Dr. Olin Pia message to have him come in for lab work at [facility] today if possible.   The patient voices understanding and is agreeable. He is aware:  [facility] Mall Entrance at [facility] 1st desk on the right to check in, past the screening table Lab hours: Monday- Friday (7:30 am- 5:30 pm)   I did confirm with him that he is taking amiodarone 200 mg- 2 tablets (400 mg) BID until further notice.  I have added a magnesium level on to his CMET/ Troponin.

## 2020-12-17 NOTE — Telephone Encounter (Signed)
Late entry:  Emily Filbert, RN  12/13/2020 5:43 PM EDT Back to Top     Preliminary results reviewed. Forwarded to MD desktop for review and signature.  Called the patient and reviewed the preliminary results with him. I have advised him to continue everything as prescribed for now and we will await Dr. Olin Pia input.  The patient voices understanding and was agreeable.

## 2020-12-18 ENCOUNTER — Other Ambulatory Visit: Payer: Self-pay | Admitting: Internal Medicine

## 2020-12-19 NOTE — Telephone Encounter (Signed)
Noted- patient previously made aware of lab results. He is scheduled to see Dr. Lovena Le on 12/24/20.

## 2020-12-19 NOTE — Telephone Encounter (Signed)
Jerry Sprang, MD  12/18/2020 5:51 PM EDT      Please Inform Patient  Labs are normal x *mild elevation in AST with the heavy burden of VT we have resumed amio and referred to dr GT for consideration of ablation  Thanks

## 2020-12-24 ENCOUNTER — Encounter (INDEPENDENT_AMBULATORY_CARE_PROVIDER_SITE_OTHER): Payer: Self-pay

## 2020-12-24 ENCOUNTER — Other Ambulatory Visit: Payer: Self-pay

## 2020-12-24 ENCOUNTER — Ambulatory Visit: Payer: Medicare HMO | Admitting: Internal Medicine

## 2020-12-24 ENCOUNTER — Encounter: Payer: Self-pay | Admitting: Internal Medicine

## 2020-12-24 VITALS — BP 100/60 | HR 69 | Ht 67.0 in | Wt 194.6 lb

## 2020-12-24 DIAGNOSIS — I5022 Chronic systolic (congestive) heart failure: Secondary | ICD-10-CM | POA: Diagnosis not present

## 2020-12-24 DIAGNOSIS — I472 Ventricular tachycardia, unspecified: Secondary | ICD-10-CM

## 2020-12-24 DIAGNOSIS — Z95810 Presence of automatic (implantable) cardiac defibrillator: Secondary | ICD-10-CM | POA: Diagnosis not present

## 2020-12-24 NOTE — Progress Notes (Signed)
HPI Jerry Farley is referred today by Dr. Caryl Comes for evaluation of VT. He is a pleasant 75 yo man with a h/o CAD, s/p stenting, probable mixed CM who is s/p ICD insertion. He has had recurrent and increasingly frequent episodes of VT with successful ICD therapies with ATP. He has not had syncope. He has had no ICD shocks.  Allergies  Allergen Reactions  . Contrast Media [Iodinated Diagnostic Agents] Other (See Comments)    Patient states "I don't remember exactly what happens but I know I cannot have contrast for testing."      Current Outpatient Medications  Medication Sig Dispense Refill  . amiodarone (PACERONE) 200 MG tablet Take 1 tablet by mouth once daily 90 tablet 0  . aspirin 81 MG tablet Take 81 mg by mouth daily.    . carvedilol (COREG) 25 MG tablet TAKE 1 TABLET BY MOUTH TWICE DAILY WITH MEALS 180 tablet 3  . clopidogrel (PLAVIX) 75 MG tablet Take 1 tablet by mouth once daily 90 tablet 3  . EUTHYROX 125 MCG tablet TAKE 1 TABLET BY MOUTH ONCE DAILY BEFORE BREAKFAST 90 tablet 0  . fish oil-omega-3 fatty acids 1000 MG capsule Take 1 g by mouth daily.    . Magnesium Oxide 400 MG CAPS Take 400 mg by mouth daily.    . Multiple Vitamin (MULTIVITAMIN) tablet Take 1 tablet by mouth daily.    Marland Kitchen oxymetazoline (AFRIN) 0.05 % nasal spray Place 1 spray into both nostrils 2 (two) times daily as needed for congestion.    . ranolazine (RANEXA) 500 MG 12 hr tablet Take 1 tablet by mouth twice daily 180 tablet 3  . rosuvastatin (CRESTOR) 20 MG tablet Take 1 tablet by mouth once daily 90 tablet 3  . sacubitril-valsartan (ENTRESTO) 24-26 MG Take 1 tablet by mouth 2 (two) times daily. 180 tablet 3  . spironolactone (ALDACTONE) 25 MG tablet Take 1/2 (one-half) tablet by mouth once daily 45 tablet 3   No current facility-administered medications for this visit.     Past Medical History:  Diagnosis Date  . 6948-lead   . Arthritis    "maybe a little"  . CAD (coronary artery disease) July  2007   CFX stent (patent Sept '07)  . Cardiac defibrillator CRT-medtronic 01/26/2011  . Chronic systolic congestive heart failure ([place])   . Complication of anesthesia    "problems waking up w/valium"  . High cholesterol   . Hypertension   . Hypothyroidism   . ICD (implantable cardiac defibrillator) in place 2006, 2013   Medtronic Protecta XT-DR CRT (01/2012)  . NICM (nonischemic cardiomyopathy) ([place])    ejection fraction 40-45% by echo July 2012  . Non-ischemic cardiomyopathy- EF 40-45% 2D 7/14 02/11/2012  . Obesity (BMI 30-39.9) 08/22/2013  . Ventricular tachycardia, non-sustained ([place]) 2007   Amiodarone    ROS:   All systems reviewed and negative except as noted in the HPI.   Past Surgical History:  Procedure Laterality Date  . BI-VENTRICULAR IMPLANTABLE CARDIOVERTER DEFIBRILLATOR UPGRADE N/A 02/10/2012   Procedure: BI-VENTRICULAR IMPLANTABLE CARDIOVERTER DEFIBRILLATOR UPGRADE;  Surgeon: Deboraha Sprang, MD;  Location: [facility] CATH LAB;  Service: Cardiovascular;  Laterality: N/A;  . BIV ICD GENERATOR CHANGEOUT N/A 06/24/2018   Procedure: BIV ICD GENERATOR CHANGEOUT;  Surgeon: Deboraha Sprang, MD;  Location: [place] CV LAB;  Service: Cardiovascular;  Laterality: N/A;  . CARDIAC CATHETERIZATION  11/30/2001   non-ischemic cardiomyopahty with high LV end-diastolic pressure; 17% distal L main stenosis, large  osital stenosis 70%, 70% Cfx stenosis (Dr. Domenic Moras)  . CARDIAC CATHETERIZATION  9/07   CFX stent patent [employer])  . CARDIAC DEFIBRILLATOR PLACEMENT  09/2004; 02/10/12   ICD implanted in 2006 (Dr. Rito Ehrlich); EOL generator change in 01/2012 - CRT (BiV ICD) (Dr. Caryl Comes)  . CORONARY ANGIOPLASTY WITH STENT PLACEMENT   01/2006   CFX   . LEFT HEART CATH AND CORONARY ANGIOGRAPHY N/A 04/13/2019   Procedure: LEFT HEART CATH AND CORONARY ANGIOGRAPHY;  Surgeon: Jettie Booze, MD;  Location: [place] CV LAB;  Service: Cardiovascular;  Laterality: N/A;  . NM MYOCAR PERF EJECTION FRACTION   05/2010   dipyridamole myoview; mod perfusion defect in apical, basal inferior, mid inferior & apical inferior regions (infarct/scar); post-stress 28%, low risk scan  . TONSILLECTOMY AND ADENOIDECTOMY     "when I was a child"  . TRANSTHORACIC ECHOCARDIOGRAM  01/2013   EF 40-45%, LV mildly dilated, mild LVH, mod hypokinesis of inferior myocardium, grade 1 diastolic dysfunction; mild MR     Family History  Problem Relation Age of Onset  . Cancer Mother   . Cancer Brother      Social History   Socioeconomic History  . Marital status: Married    Spouse name: Not on file  . Number of children: Not on file  . Years of education: Not on file  . Highest education level: Not on file  Occupational History  . Occupation: retired -- Armed forces operational officer  Tobacco Use  . Smoking status: Former Smoker    Packs/day: 1.50    Years: 42.00    Pack years: 63.00    Types: Cigarettes    Quit date: 07/28/2003    Years since quitting: 17.4  . Smokeless tobacco: Former Systems developer    Types: Chew  . Tobacco comment: "didn't chew but a year or 2; don't know when I quit"  Vaping Use  . Vaping Use: Never used  Substance and Sexual Activity  . Alcohol use: No  . Drug use: No  . Sexual activity: Never  Other Topics Concern  . Not on file  Social History Narrative  . Not on file   Social Determinants of Health   Financial Resource Strain: Not on file  Food Insecurity: Not on file  Transportation Needs: Not on file  Physical Activity: Not on file  Stress: Not on file  Social Connections: Not on file  Intimate Partner Violence: Not on file     BP 100/60   Pulse 69   Ht 5\' 7"  (1.702 m)   Wt 194 lb 9.6 oz (88.3 kg)   SpO2 98%   BMI 30.48 kg/m   Physical Exam:  Well appearing NAD HEENT: Unremarkable Neck:  No JVD, no thyromegally Lymphatics:  No adenopathy Back:  No CVA tenderness Lungs:  Clear with no wheezes HEART:  Regular rate rhythm, no murmurs, no rubs, no clicks Abd:  soft, positive  bowel sounds, no organomegally, no rebound, no guarding Ext:  2 plus pulses, no edema, no cyanosis, no clubbing Skin:  No rashes no nodules Neuro:  CN II through XII intact, motor grossly intact  DEVICE  Normal device function.  See PaceArt for details.   Assess/Plan: 1. VT - he has had over 30 episodes of VT in the last month, all with successful ATP. This is despite amiodarone. I have discussed the treatment options with the patient and the risks/benefits/goals/expectations of VT ablation were reviewed. He will call us if he wishes to proceed. 2. Chronic  systolic heart failure - his symptoms are class 2 on maximal medical therapy. 3. ICD - his device is working normally.  4. Dyslipidemia - he will continue his crestor. No muscle aches.  Carleene Overlie Ziggy Reveles,MD

## 2020-12-24 NOTE — Patient Instructions (Addendum)
Medication Instructions:  Your physician recommends that you continue on your current medications as directed. Please refer to the Current Medication list given to you today.  Labwork: None ordered.  Testing/Procedures: Your physician has recommended that you have an ablation. Catheter ablation is a medical procedure used to treat some cardiac arrhythmias (irregular heartbeats). During catheter ablation, a long, thin, flexible tube is put into a blood vessel in your groin (upper thigh), or neck. This tube is called an ablation catheter. It is then guided to your heart through the blood vessel. Radio frequency waves destroy small areas of heart tissue where abnormal heartbeats may cause an arrhythmia to start. Please see the instruction sheet given to you today.   Follow-Up: The following dates are available for procedures:  July 21  August 26  Remote monitoring is used to monitor your ICD from home. This monitoring reduces the number of office visits required to check your device to one time per year. It allows [place] to keep an eye on the functioning of your device to ensure it is working properly. You are scheduled for a device check from home on 03/26/2021. You may send your transmission at any time that day. If you have a wireless device, the transmission will be sent automatically. After your physician reviews your transmission, you will receive a postcard with your next transmission date.  Any Other Special Instructions Will Be Listed Below (If Applicable).  If you need a refill on your cardiac medications before your next appointment, please call your pharmacy.    Cardiac electrophysiology: From cell to bedside (7th ed., pp. 8182-9937). [place], [place]: Elsevier.">  Cardiac Ablation Cardiac ablation is a procedure to destroy, or ablate, a small amount of heart tissue in very specific places. The heart has many electrical connections. Sometimes these connections are abnormal and can cause  the heart to beat very fast or irregularly. Ablating some of the areas that cause problems can improve the heart's rhythm or return it to normal. Ablation may be done for people who:  Have Wolff-Parkinson-White syndrome.  Have fast heart rhythms (tachycardia).  Have taken medicines for an abnormal heart rhythm (arrhythmia) that were not effective or caused side effects.  Have a high-risk heartbeat that may be life-threatening. During the procedure, a small incision is made in the neck or the groin, and a long, thin tube (catheter) is inserted into the incision and moved to the heart. Small devices (electrodes) on the tip of the catheter will send out electrical currents. A type of X-ray (fluoroscopy) will be used to help guide the catheter and to provide images of the heart. Tell a health care provider about:  Any allergies you have.  All medicines you are taking, including vitamins, herbs, eye drops, creams, and over-the-counter medicines.  Any problems you or family members have had with anesthetic medicines.  Any blood disorders you have.  Any surgeries you have had.  Any medical conditions you have, such as kidney failure.  Whether you are pregnant or may be pregnant. What are the risks? Generally, this is a safe procedure. However, problems may occur, including:  Infection.  Bruising and bleeding at the catheter insertion site.  Bleeding into the chest, especially into the sac that surrounds the heart. This is a serious complication.  Stroke or blood clots.  Damage to nearby structures or organs.  Allergic reaction to medicines or dyes.  Need for a permanent pacemaker if the normal electrical system is damaged. A pacemaker is a small  computer that sends electrical signals to the heart and helps your heart beat normally.  The procedure not being fully effective. This may not be recognized until months later. Repeat ablation procedures are sometimes done. What happens  before the procedure? Medicines Ask your health care provider about:  Changing or stopping your regular medicines. This is especially important if you are taking diabetes medicines or blood thinners.  Taking medicines such as aspirin and ibuprofen. These medicines can thin your blood. Do not take these medicines unless your health care provider tells you to take them.  Taking over-the-counter medicines, vitamins, herbs, and supplements. General instructions  Follow instructions from your health care provider about eating or drinking restrictions.  Plan to have someone take you home from the hospital or clinic.  If you will be going home right after the procedure, plan to have someone with you for 24 hours.  Ask your health care provider what steps will be taken to prevent infection. What happens during the procedure?  An IV will be inserted into one of your veins.  You will be given a medicine to help you relax (sedative).  The skin on your neck or groin will be numbed.  An incision will be made in your neck or your groin.  A needle will be inserted through the incision and into a large vein in your neck or groin.  A catheter will be inserted into the needle and moved to your heart.  Dye may be injected through the catheter to help your surgeon see the area of the heart that needs treatment.  Electrical currents will be sent from the catheter to ablate heart tissue in desired areas. There are three types of energy that may be used to do this: ? Heat (radiofrequency energy). ? Laser energy. ? Extreme cold (cryoablation).  When the tissue has been ablated, the catheter will be removed.  Pressure will be held on the insertion area to prevent a lot of bleeding.  A bandage (dressing) will be placed over the insertion area. The exact procedure may vary among health care providers and hospitals.   What happens after the procedure?  Your blood pressure, heart rate, breathing  rate, and blood oxygen level will be monitored until you leave the hospital or clinic.  Your insertion area will be monitored for bleeding. You will need to lie still for a few hours to ensure that you do not bleed from the insertion area.  Do not drive for 24 hours or as long as told by your health care provider. Summary  Cardiac ablation is a procedure to destroy, or ablate, a small amount of heart tissue using an electrical current. This procedure can improve the heart rhythm or return it to normal.  Tell your health care provider about any medical conditions you may have and all medicines you are taking to treat them.  This is a safe procedure, but problems may occur. Problems may include infection, bruising, damage to nearby organs or structures, or allergic reactions to medicines.  Follow your health care provider's instructions about eating and drinking before the procedure. You may also be told to change or stop some of your medicines.  After the procedure, do not drive for 24 hours or as long as told by your health care provider. This information is not intended to replace advice given to you by your health care provider. Make sure you discuss any questions you have with your health care provider. Document Revised: 05/22/2019 Document Reviewed:  05/22/2019 Elsevier Patient Education  2021 [employer].

## 2020-12-25 ENCOUNTER — Ambulatory Visit (INDEPENDENT_AMBULATORY_CARE_PROVIDER_SITE_OTHER): Payer: Medicare HMO

## 2020-12-25 DIAGNOSIS — I428 Other cardiomyopathies: Secondary | ICD-10-CM

## 2020-12-26 LAB — CUP PACEART REMOTE DEVICE CHECK
Battery Remaining Longevity: 46 mo
Battery Remaining Percentage: 60 %
Battery Voltage: 2.93 V
Brady Statistic AP VP Percent: 98 %
Brady Statistic AP VS Percent: 1.6 %
Brady Statistic AS VP Percent: 1 %
Brady Statistic AS VS Percent: 1 %
Brady Statistic RA Percent Paced: 99 %
Date Time Interrogation Session: 20220601020016
HighPow Impedance: 70 Ohm
HighPow Impedance: 71 Ohm
Implantable Lead Implant Date: 20130717
Implantable Lead Implant Date: 20130717
Implantable Lead Implant Date: 20130717
Implantable Lead Location: 753858
Implantable Lead Location: 753859
Implantable Lead Location: 753860
Implantable Lead Model: 4396
Implantable Lead Model: 5076
Implantable Lead Model: 6935
Implantable Pulse Generator Implant Date: 20191129
Lead Channel Impedance Value: 440 Ohm
Lead Channel Impedance Value: 540 Ohm
Lead Channel Impedance Value: 910 Ohm
Lead Channel Pacing Threshold Amplitude: 0.75 V
Lead Channel Pacing Threshold Amplitude: 0.75 V
Lead Channel Pacing Threshold Amplitude: 1.25 V
Lead Channel Pacing Threshold Pulse Width: 0.5 ms
Lead Channel Pacing Threshold Pulse Width: 0.5 ms
Lead Channel Pacing Threshold Pulse Width: 0.5 ms
Lead Channel Sensing Intrinsic Amplitude: 2.5 mV
Lead Channel Sensing Intrinsic Amplitude: 8.5 mV
Lead Channel Setting Pacing Amplitude: 2 V
Lead Channel Setting Pacing Amplitude: 2.25 V
Lead Channel Setting Pacing Amplitude: 2.5 V
Lead Channel Setting Pacing Pulse Width: 0.5 ms
Lead Channel Setting Pacing Pulse Width: 0.5 ms
Lead Channel Setting Sensing Sensitivity: 0.5 mV
Pulse Gen Serial Number: 9812589

## 2020-12-27 ENCOUNTER — Telehealth: Payer: Self-pay | Admitting: Internal Medicine

## 2020-12-27 DIAGNOSIS — I472 Ventricular tachycardia, unspecified: Secondary | ICD-10-CM

## 2020-12-27 NOTE — Telephone Encounter (Signed)
Hi Good afternoon, I have the pt on the line requesting to speak w/ you, they will not provide any additional info in regards to the nature of the call.

## 2020-12-27 NOTE — Telephone Encounter (Signed)
Left message to call back  

## 2020-12-30 NOTE — Telephone Encounter (Signed)
Pts wife called to report that she is wanting to schedule the pts Ablation with Dr. Lovena Le for 02/13/21. I advised her that Sonia Baller is out of the office today but twill get back in touch with her later in the week with instructions and she verbalized understanding.

## 2020-12-30 NOTE — Telephone Encounter (Signed)
Patient's wife calling back. She states she needs to schedule the patient's surgery. She states they had to do it today or they lose the schedule for July 21st. She states she called Friday, but has not received a call back. She states to call their home number, but if they do not answer to call their cell phone [phone].

## 2021-01-01 NOTE — Telephone Encounter (Signed)
Confirmed medication instructions.  Instruction letter printed and mailed to Pt per request.

## 2021-01-01 NOTE — Telephone Encounter (Signed)
Patient's wife calling back. She is concerned that she has not heard back and is worried the dates given for the surgery could have already been filled.

## 2021-01-01 NOTE — Telephone Encounter (Signed)
Pt is scheduled for VT ablation on February 13, 2021 at 7:30 am  Will get lab work at [facility]  Need to clarify medication instructions and mail instruction letter.

## 2021-01-16 ENCOUNTER — Other Ambulatory Visit: Payer: Self-pay | Admitting: Internal Medicine

## 2021-01-16 NOTE — Progress Notes (Signed)
Remote ICD transmission.   

## 2021-01-17 ENCOUNTER — Telehealth: Payer: Self-pay | Admitting: Internal Medicine

## 2021-01-17 MED ORDER — AMIODARONE HCL 200 MG PO TABS: 400.0000 mg | ORAL_TABLET | Freq: Every day | ORAL | 3 refills | Status: DC

## 2021-01-17 NOTE — Telephone Encounter (Signed)
Pt c/o medication issue:  1. Name of Medication:  amiodarone (PACERONE) 200 MG tablet  2. How are you currently taking this medication (dosage and times per day)?   3. Are you having a reaction (difficulty breathing--STAT)?   4. What is your medication issue?   Patient states his insurance will not cover Amiodarone until August and it will cost him $24 for his 90 day supply. He would like to know if there is something else he can take or if we can offer some addistance with purchasing. Otherwise, he will pay the $24 assuming it is for 90 days.

## 2021-01-17 NOTE — Telephone Encounter (Signed)
Patient calling because Dr. Caryl Comes increased his amiodarone to 400 mg twice daily however his prescription was not updated. The prescription was sent in as amiodarone 200 mg once daily. Patient has been on 400 mg BID since 5/20. He is scheduled for VT ablation 7/21. Spoke with DOD, Dr. Curt Bears to advise if patient needs to continue high dose amiodarone. Per Dr. Curt Bears patient can decrease to amiodarone 400 mg daily. Patient is aware and prescription has been updated.

## 2021-01-31 ENCOUNTER — Other Ambulatory Visit
Admission: RE | Admit: 2021-01-31 | Discharge: 2021-01-31 | Disposition: A | Discharge status: Home / Self Care | Payer: Medicare HMO | Attending: Internal Medicine | Admitting: Internal Medicine

## 2021-01-31 DIAGNOSIS — I472 Ventricular tachycardia, unspecified: Secondary | ICD-10-CM

## 2021-01-31 LAB — CBC WITH DIFFERENTIAL/PLATELET
Abs Immature Granulocytes: 0.02 10*3/uL (ref 0.00–0.07)
Basophils Absolute: 0 10*3/uL (ref 0.0–0.1)
Basophils Relative: 1 %
Eosinophils Absolute: 0.2 10*3/uL (ref 0.0–0.5)
Eosinophils Relative: 3 %
HCT: 39.5 % (ref 39.0–52.0)
Hemoglobin: 13.4 g/dL (ref 13.0–17.0)
Immature Granulocytes: 0 %
Lymphocytes Relative: 29 %
Lymphs Abs: 1.8 10*3/uL (ref 0.7–4.0)
MCH: 32.6 pg (ref 26.0–34.0)
MCHC: 33.9 g/dL (ref 30.0–36.0)
MCV: 96.1 fL (ref 80.0–100.0)
Monocytes Absolute: 0.4 10*3/uL (ref 0.1–1.0)
Monocytes Relative: 7 %
Neutro Abs: 3.6 10*3/uL (ref 1.7–7.7)
Neutrophils Relative %: 60 %
Platelets: 144 10*3/uL — ABNORMAL LOW (ref 150–400)
RBC: 4.11 MIL/uL — ABNORMAL LOW (ref 4.22–5.81)
RDW: 13.2 % (ref 11.5–15.5)
WBC: 6.1 10*3/uL (ref 4.0–10.5)
nRBC: 0 % (ref 0.0–0.2)

## 2021-01-31 LAB — BASIC METABOLIC PANEL
Anion gap: 7 (ref 5–15)
BUN: 15 mg/dL (ref 8–23)
CO2: 29 mmol/L (ref 22–32)
Calcium: 9.2 mg/dL (ref 8.9–10.3)
Chloride: 105 mmol/L (ref 98–111)
Creatinine, Ser: 1.2 mg/dL (ref 0.61–1.24)
GFR, Estimated: >60 mL/min (ref >60)
Glucose, Bld: 121 mg/dL — ABNORMAL HIGH (ref 70–99)
Potassium: 4.8 mmol/L (ref 3.5–5.1)
Sodium: 141 mmol/L (ref 135–145)

## 2021-02-12 NOTE — Anesthesia Preprocedure Evaluation (Addendum)
Anesthesia Evaluation  Patient identified by MRN, date of birth, ID band Patient awake    Reviewed: Allergy & Precautions, NPO status , Patient's Chart, lab work & pertinent test results, reviewed documented beta blocker date and time   History of Anesthesia Complications Negative for: history of anesthetic complications  Airway Mallampati: II  TM Distance: >3 FB Neck ROM: Full    Dental  (+) Missing,    Pulmonary former smoker,    Pulmonary exam normal        Cardiovascular hypertension, Pt. on medications and Pt. on home beta blockers + CAD (on Plavix), + Cardiac Stents (2007) and +CHF  Normal cardiovascular exam+ dysrhythmias Ventricular Tachycardia + Cardiac Defibrillator   TTE 2016: EF 40-45%, inferior and inferoseptal hypokinesis, grade 1 DD, RV systolicfunction reduced, PASP 64mmHg    Neuro/Psych negative neurological ROS  negative psych ROS   GI/Hepatic negative GI ROS, Neg liver ROS,   Endo/Other  Hypothyroidism   Renal/GU negative Renal ROS  negative genitourinary   Musculoskeletal  (+) Arthritis ,   Abdominal   Peds  Hematology negative hematology ROS (+)   Anesthesia Other Findings Day of surgery medications reviewed with patient.  Reproductive/Obstetrics negative OB ROS                            Anesthesia Physical Anesthesia Plan  ASA: 3  Anesthesia Plan: General   Post-op Pain Management:    Induction: Intravenous  PONV Risk Score and Plan: 2 and Treatment may vary due to age or medical condition, Ondansetron and Dexamethasone  Airway Management Planned: Oral ETT  Additional Equipment: None  Intra-op Plan:   Post-operative Plan: Extubation in OR  Informed Consent: I have reviewed the patients History and Physical, chart, labs and discussed the procedure including the risks, benefits and alternatives for the proposed anesthesia with the patient or authorized  representative who has indicated his/her understanding and acceptance.     Dental advisory given  Plan Discussed with: CRNA  Anesthesia Plan Comments:        Anesthesia Quick Evaluation

## 2021-02-12 NOTE — Pre-Procedure Instructions (Signed)
Attempted to call patient regarding procedure instructions.  Left voice mail on the following items: Arrival time 0530 Nothing to eat or drink after midnight No meds AM of procedure Responsible person to drive you home and stay with you for 24 hrs      

## 2021-02-13 ENCOUNTER — Other Ambulatory Visit: Payer: Self-pay

## 2021-02-13 ENCOUNTER — Encounter (HOSPITAL_COMMUNITY)
Admission: RE | Disposition: A | Discharge status: Home / Self Care | Payer: Self-pay | Source: Home / Self Care | Attending: Internal Medicine

## 2021-02-13 ENCOUNTER — Ambulatory Visit (HOSPITAL_COMMUNITY): Payer: Medicare HMO | Admitting: Anesthesiology

## 2021-02-13 ENCOUNTER — Encounter (HOSPITAL_COMMUNITY): Payer: Self-pay | Admitting: Internal Medicine

## 2021-02-13 ENCOUNTER — Ambulatory Visit (HOSPITAL_COMMUNITY)
Admission: RE | Admit: 2021-02-13 | Discharge: 2021-02-14 | Disposition: A | Discharge status: Home / Self Care | Payer: Medicare HMO | Attending: Internal Medicine | Admitting: Internal Medicine

## 2021-02-13 DIAGNOSIS — I472 Ventricular tachycardia: Secondary | ICD-10-CM | POA: Insufficient documentation

## 2021-02-13 DIAGNOSIS — I2510 Atherosclerotic heart disease of native coronary artery without angina pectoris: Secondary | ICD-10-CM | POA: Insufficient documentation

## 2021-02-13 DIAGNOSIS — Z87891 Personal history of nicotine dependence: Secondary | ICD-10-CM | POA: Insufficient documentation

## 2021-02-13 DIAGNOSIS — I110 Hypertensive heart disease with heart failure: Secondary | ICD-10-CM | POA: Diagnosis not present

## 2021-02-13 DIAGNOSIS — Z7902 Long term (current) use of antithrombotics/antiplatelets: Secondary | ICD-10-CM | POA: Insufficient documentation

## 2021-02-13 DIAGNOSIS — Z955 Presence of coronary angioplasty implant and graft: Secondary | ICD-10-CM | POA: Insufficient documentation

## 2021-02-13 DIAGNOSIS — I5022 Chronic systolic (congestive) heart failure: Secondary | ICD-10-CM | POA: Insufficient documentation

## 2021-02-13 DIAGNOSIS — Z95810 Presence of automatic (implantable) cardiac defibrillator: Secondary | ICD-10-CM | POA: Insufficient documentation

## 2021-02-13 DIAGNOSIS — E039 Hypothyroidism, unspecified: Secondary | ICD-10-CM | POA: Diagnosis not present

## 2021-02-13 DIAGNOSIS — Z7982 Long term (current) use of aspirin: Secondary | ICD-10-CM | POA: Diagnosis not present

## 2021-02-13 DIAGNOSIS — Z91041 Radiographic dye allergy status: Secondary | ICD-10-CM | POA: Insufficient documentation

## 2021-02-13 DIAGNOSIS — E785 Hyperlipidemia, unspecified: Secondary | ICD-10-CM | POA: Insufficient documentation

## 2021-02-13 DIAGNOSIS — Z79899 Other long term (current) drug therapy: Secondary | ICD-10-CM | POA: Diagnosis not present

## 2021-02-13 HISTORY — PX: V TACH ABLATION: EP1227

## 2021-02-13 LAB — POCT ACTIVATED CLOTTING TIME
Activated Clotting Time: 202 seconds
Activated Clotting Time: 231 seconds
Activated Clotting Time: 237 seconds
Activated Clotting Time: 271 seconds
Activated Clotting Time: 271 seconds
Activated Clotting Time: 254 seconds
Activated Clotting Time: 289 seconds
Activated Clotting Time: 155 seconds

## 2021-02-13 SURGERY — V TACH ABLATION
Anesthesia: General

## 2021-02-13 MED ORDER — BUPIVACAINE HCL (PF) 0.25 % IJ SOLN
INTRAMUSCULAR | Status: DC | PRN
  Administered 2021-02-13: 15 mL

## 2021-02-13 MED ORDER — PHENYLEPHRINE HCL-NACL 10-0.9 MG/250ML-% IV SOLN
INTRAVENOUS | Status: DC | PRN
  Administered 2021-02-13: 25 ug/min via INTRAVENOUS

## 2021-02-13 MED ORDER — SODIUM CHLORIDE 0.9% FLUSH: 3.0000 mL | INTRAVENOUS | Status: DC | PRN

## 2021-02-13 MED ORDER — HEPARIN SODIUM (PORCINE) 1000 UNIT/ML IJ SOLN
INTRAMUSCULAR | Status: DC | PRN
  Administered 2021-02-13: 2000 [IU] via INTRAVENOUS
  Administered 2021-02-13: 7000 [IU] via INTRAVENOUS
  Administered 2021-02-13 (×2): 2000 [IU] via INTRAVENOUS
  Administered 2021-02-13 (×2): 3000 [IU] via INTRAVENOUS

## 2021-02-13 MED ORDER — ONDANSETRON HCL 4 MG/2ML IJ SOLN: 4.0000 mg | Freq: Four times a day (QID) | INTRAMUSCULAR | Status: DC | PRN

## 2021-02-13 MED ORDER — HEPARIN (PORCINE) IN NACL 1000-0.9 UT/500ML-% IV SOLN
INTRAVENOUS | Status: DC | PRN
  Administered 2021-02-13 (×2): 500 mL

## 2021-02-13 MED ORDER — SODIUM CHLORIDE 0.9% FLUSH
3.0000 mL | Freq: Two times a day (BID) | INTRAVENOUS | Status: DC
  Administered 2021-02-13: 3 mL via INTRAVENOUS

## 2021-02-13 MED ORDER — DEXMEDETOMIDINE (PRECEDEX) IN NS 20 MCG/5ML (4 MCG/ML) IV SYRINGE
PREFILLED_SYRINGE | INTRAVENOUS | Status: DC | PRN
  Administered 2021-02-13: 8 ug via INTRAVENOUS
  Administered 2021-02-13: 4 ug via INTRAVENOUS
  Administered 2021-02-13: 8 ug via INTRAVENOUS

## 2021-02-13 MED ORDER — ASPIRIN EC 81 MG PO TBEC
81.0000 mg | DELAYED_RELEASE_TABLET | Freq: Every day | ORAL | Status: DC
  Administered 2021-02-13 – 2021-02-14 (×2): 81 mg via ORAL
  Filled 2021-02-13 (×2): qty 1

## 2021-02-13 MED ORDER — ACETAMINOPHEN 325 MG PO TABS
650.0000 mg | ORAL_TABLET | ORAL | Status: DC | PRN
  Administered 2021-02-13 – 2021-02-14 (×2): 650 mg via ORAL
  Filled 2021-02-13 (×2): qty 2

## 2021-02-13 MED ORDER — MAGNESIUM OXIDE -MG SUPPLEMENT 400 (240 MG) MG PO TABS
400.0000 mg | ORAL_TABLET | Freq: Every day | ORAL | Status: DC
  Administered 2021-02-13 – 2021-02-14 (×2): 400 mg via ORAL
  Filled 2021-02-13 (×2): qty 1

## 2021-02-13 MED ORDER — ADULT MULTIVITAMIN W/MINERALS CH
1.0000 | ORAL_TABLET | Freq: Every day | ORAL | Status: DC
  Administered 2021-02-13 – 2021-02-14 (×2): 1 via ORAL
  Filled 2021-02-13 (×2): qty 1

## 2021-02-13 MED ORDER — OMEGA-3-ACID ETHYL ESTERS 1 G PO CAPS
1.0000 g | ORAL_CAPSULE | Freq: Every day | ORAL | Status: DC
  Administered 2021-02-13 – 2021-02-14 (×2): 1 g via ORAL
  Filled 2021-02-13 (×2): qty 1

## 2021-02-13 MED ORDER — SPIRONOLACTONE 12.5 MG HALF TABLET
12.5000 mg | ORAL_TABLET | Freq: Every day | ORAL | Status: DC
  Administered 2021-02-14: 12.5 mg via ORAL
  Filled 2021-02-13: qty 1

## 2021-02-13 MED ORDER — CARVEDILOL 25 MG PO TABS
25.0000 mg | ORAL_TABLET | Freq: Two times a day (BID) | ORAL | Status: DC
  Administered 2021-02-13: 25 mg via ORAL
  Filled 2021-02-13 (×2): qty 1

## 2021-02-13 MED ORDER — ACETAMINOPHEN 500 MG PO TABS: 1000.0000 mg | ORAL_TABLET | Freq: Four times a day (QID) | ORAL | Status: DC | PRN

## 2021-02-13 MED ORDER — SODIUM CHLORIDE 0.9 % IV SOLN: INTRAVENOUS | Status: DC

## 2021-02-13 MED ORDER — SODIUM CHLORIDE 0.9 % IV SOLN: 250.0000 mL | INTRAVENOUS | Status: DC | PRN

## 2021-02-13 MED ORDER — CLOPIDOGREL BISULFATE 75 MG PO TABS
75.0000 mg | ORAL_TABLET | Freq: Every day | ORAL | Status: DC
  Administered 2021-02-13 – 2021-02-14 (×2): 75 mg via ORAL
  Filled 2021-02-13 (×2): qty 1

## 2021-02-13 MED ORDER — FENTANYL CITRATE (PF) 100 MCG/2ML IJ SOLN
INTRAMUSCULAR | Status: DC | PRN
  Administered 2021-02-13 (×3): 50 ug via INTRAVENOUS
  Administered 2021-02-13: 25 ug via INTRAVENOUS

## 2021-02-13 MED ORDER — PROPOFOL 500 MG/50ML IV EMUL
INTRAVENOUS | Status: DC | PRN
  Administered 2021-02-13: 100 ug/kg/min via INTRAVENOUS
  Administered 2021-02-13: 50 ug/kg/min via INTRAVENOUS

## 2021-02-13 MED ORDER — HEPARIN SODIUM (PORCINE) 1000 UNIT/ML IJ SOLN
INTRAMUSCULAR | Status: DC | PRN
  Administered 2021-02-13: 1000 [IU] via INTRAVENOUS

## 2021-02-13 MED ORDER — AMIODARONE HCL 200 MG PO TABS
400.0000 mg | ORAL_TABLET | Freq: Every day | ORAL | Status: DC
  Administered 2021-02-13 – 2021-02-14 (×2): 400 mg via ORAL
  Filled 2021-02-13 (×2): qty 2

## 2021-02-13 MED ORDER — SACUBITRIL-VALSARTAN 24-26 MG PO TABS
1.0000 | ORAL_TABLET | Freq: Two times a day (BID) | ORAL | Status: DC
  Administered 2021-02-13: 1 via ORAL
  Filled 2021-02-13: qty 1

## 2021-02-13 MED ORDER — PROTAMINE SULFATE 10 MG/ML IV SOLN
INTRAVENOUS | Status: DC | PRN
  Administered 2021-02-13: 30 mg via INTRAVENOUS

## 2021-02-13 MED ORDER — OXYMETAZOLINE HCL 0.05 % NA SOLN
1.0000 | Freq: Two times a day (BID) | NASAL | Status: DC | PRN
  Administered 2021-02-14: 1 via NASAL
  Filled 2021-02-13 (×2): qty 30

## 2021-02-13 SURGICAL SUPPLY — 13 items
BAG SNAP BAND KOVER 36X36 (MISCELLANEOUS) ×1 IMPLANT
BLANKET WARM UNDERBOD FULL ACC (MISCELLANEOUS) ×1 IMPLANT
CATH JOSEPH QUAD ALLRED 6F REP (CATHETERS) ×2 IMPLANT
CATH SMTCH THERMOCOOL SF DF (CATHETERS) ×1 IMPLANT
PACK EP LATEX FREE (CUSTOM PROCEDURE TRAY) ×2
PACK EP LF (CUSTOM PROCEDURE TRAY) ×1 IMPLANT
PAD PRO RADIOLUCENT 2001M-C (PAD) ×2 IMPLANT
PATCH CARTO3 (PAD) ×1 IMPLANT
SHEATH INTROD W/O MIN 9FR 25CM (SHEATH) ×1 IMPLANT
SHEATH PINNACLE 6F 10CM (SHEATH) ×3 IMPLANT
SHEATH PINNACLE 8F 10CM (SHEATH) ×2 IMPLANT
TUBING SMART ABLATE COOLFLOW (TUBING) ×1 IMPLANT
WIRE HI TORQ VERSACORE-J 145CM (WIRE) ×1 IMPLANT

## 2021-02-13 NOTE — Progress Notes (Signed)
Site area: right groin  Site Prior to Removal:  Level 0  Pressure Applied For 25 MINUTES    Minutes Beginning at 1340  Manual:   Yes.    Patient Status During Pull:  Stable  Post Pull Groin Site:  Level 0  Post Pull Instructions Given:  Yes.    Post Pull Pulses Present:  Yes.    Dressing Applied:  Yes.    Comments: bed rest started at 1405 X 4 hr.

## 2021-02-13 NOTE — Transfer of Care (Signed)
Immediate Anesthesia Transfer of Care Note  Patient: ALIF PETRAK  Procedure(s) Performed: Stephanie Coup ABLATION  Patient Location: Cath Lab  Anesthesia Type:MAC  Level of Consciousness: awake  Airway & Oxygen Therapy: Patient Spontanous Breathing and Patient connected to face mask oxygen  Post-op Assessment: Report given to RN and Post -op Vital signs reviewed and stable  Post vital signs: Reviewed and stable  Last Vitals:  Vitals Value Taken Time  BP    Temp    Pulse 75 02/13/21 1235  Resp 18 02/13/21 1235  SpO2 92 % 02/13/21 1235  Vitals shown include unvalidated device data.  Last Pain:  Vitals:   02/13/21 0544  TempSrc:   PainSc: 0-No pain         Complications: No notable events documented.

## 2021-02-13 NOTE — H&P (Signed)
HPI [name] is referred today by Dr. Caryl Comes for evaluation of VT. He is a pleasant 75 yo man with a h/o CAD, s/p stenting, probable mixed CM who is s/p ICD insertion. He has had recurrent and increasingly frequent episodes of VT with successful ICD therapies with ATP. He has not had syncope. He has had no ICD shocks.       Allergies  Allergen Reactions   Contrast Media [Iodinated Diagnostic Agents] Other (See Comments)      Patient states "I don't remember exactly what happens but I know I cannot have contrast for testing."               Current Outpatient Medications  Medication Sig Dispense Refill   amiodarone (PACERONE) 200 MG tablet Take 1 tablet by mouth once daily 90 tablet 0   aspirin 81 MG tablet Take 81 mg by mouth daily.       carvedilol (COREG) 25 MG tablet TAKE 1 TABLET BY MOUTH TWICE DAILY WITH MEALS 180 tablet 3   clopidogrel (PLAVIX) 75 MG tablet Take 1 tablet by mouth once daily 90 tablet 3   EUTHYROX 125 MCG tablet TAKE 1 TABLET BY MOUTH ONCE DAILY BEFORE BREAKFAST 90 tablet 0   fish oil-omega-3 fatty acids 1000 MG capsule Take 1 g by mouth daily.       Magnesium Oxide 400 MG CAPS Take 400 mg by mouth daily.       Multiple Vitamin (MULTIVITAMIN) tablet Take 1 tablet by mouth daily.       oxymetazoline (AFRIN) 0.05 % nasal spray Place 1 spray into both nostrils 2 (two) times daily as needed for congestion.       ranolazine (RANEXA) 500 MG 12 hr tablet Take 1 tablet by mouth twice daily 180 tablet 3   rosuvastatin (CRESTOR) 20 MG tablet Take 1 tablet by mouth once daily 90 tablet 3   sacubitril-valsartan (ENTRESTO) 24-26 MG Take 1 tablet by mouth 2 (two) times daily. 180 tablet 3   spironolactone (ALDACTONE) 25 MG tablet Take 1/2 (one-half) tablet by mouth once daily 45 tablet 3    No current facility-administered medications for this visit.            Past Medical History:  Diagnosis Date   6948-lead     Arthritis      "maybe a little"   CAD  (coronary artery disease) July 2007    CFX stent (patent Sept '07)   Cardiac defibrillator CRT-medtronic 08/01/1094   Chronic systolic congestive heart failure (HCC)     Complication of anesthesia      "problems waking up w/valium"   High cholesterol     Hypertension     Hypothyroidism     ICD (implantable cardiac defibrillator) in place 2006, 2013    Medtronic Protecta XT-DR CRT (01/2012)   NICM (nonischemic cardiomyopathy) ([place])      ejection fraction 40-45% by echo July 2012   Non-ischemic cardiomyopathy- EF 40-45% 2D 7/14 02/11/2012   Obesity (BMI 30-39.9) 08/22/2013   Ventricular tachycardia, non-sustained ([place]) 2007    Amiodarone      ROS:    All systems reviewed and negative except as noted in the HPI.          Past Surgical History:  Procedure Laterality Date   BI-VENTRICULAR IMPLANTABLE CARDIOVERTER DEFIBRILLATOR UPGRADE N/A 02/10/2012    Procedure: BI-VENTRICULAR IMPLANTABLE CARDIOVERTER DEFIBRILLATOR UPGRADE;  Surgeon: Deboraha Sprang, MD;  Location:  [place] CATH LAB;  Service: Cardiovascular;  Laterality: N/A;   BIV ICD GENERATOR CHANGEOUT N/A 06/24/2018    Procedure: BIV ICD GENERATOR CHANGEOUT;  Surgeon: Deboraha Sprang, MD;  Location: [place] CV LAB;  Service: Cardiovascular;  Laterality: N/A;   CARDIAC CATHETERIZATION   11/30/2001    non-ischemic cardiomyopahty with high LV end-diastolic pressure; 58% distal L main stenosis, large osital stenosis 70%, 70% Cfx stenosis (Dr. Domenic Moras)   CARDIAC CATHETERIZATION   9/07    CFX stent patent [facility])   CARDIAC DEFIBRILLATOR PLACEMENT   09/2004; 02/10/12    ICD implanted in 2006 (Dr. Rito Ehrlich); EOL generator change in 01/2012 - CRT (BiV ICD) (Dr. Caryl Comes)   [place]    01/2006    CFX   LEFT HEART CATH AND CORONARY ANGIOGRAPHY N/A 04/13/2019    Procedure: LEFT HEART CATH AND CORONARY ANGIOGRAPHY;  Surgeon: Jettie Booze, MD;  Location: [place] CV LAB;  Service: Cardiovascular;  Laterality:  N/A;   NM MYOCAR PERF EJECTION FRACTION   05/2010    dipyridamole myoview; mod perfusion defect in apical, basal inferior, mid inferior & apical inferior regions (infarct/scar); post-stress 28%, low risk scan   TONSILLECTOMY AND ADENOIDECTOMY        "when I was a child"   TRANSTHORACIC ECHOCARDIOGRAM   01/2013    EF 40-45%, LV mildly dilated, mild LVH, mod hypokinesis of inferior myocardium, grade 1 diastolic dysfunction; mild MR             Family History  Problem Relation Age of Onset   Cancer Mother     Cancer Brother          Social History         Socioeconomic History   Marital status: Married      Spouse name: Not on file   Number of children: Not on file   Years of education: Not on file   Highest education level: Not on file  Occupational History   Occupation: retired -- Armed forces operational officer  Tobacco Use   Smoking status: Former Smoker      Packs/day: 1.50      Years: 42.00      Pack years: 63.00      Types: Cigarettes      Quit date: 07/28/2003      Years since quitting: 17.4   Smokeless tobacco: Former Systems developer      Types: Chew   Tobacco comment: "didn't chew but a year or 2; don't know when I quit"  Vaping Use   Vaping Use: Never used  Substance and Sexual Activity   Alcohol use: No   Drug use: No   Sexual activity: Never  Other Topics Concern   Not on file  Social History Narrative   Not on file    Social Determinants of Health    Financial Resource Strain: Not on file  Food Insecurity: Not on file  Transportation Needs: Not on file  Physical Activity: Not on file  Stress: Not on file  Social Connections: Not on file  Intimate Partner Violence: Not on file        BP 100/60   Pulse 69   Ht 5\' 7"  (1.702 m)   Wt 194 lb 9.6 oz (88.3 kg)   SpO2 98%   BMI 30.48 kg/m    Physical Exam:   Well appearing NAD HEENT: Unremarkable Neck:  No JVD, no thyromegally Lymphatics:  No adenopathy Back:  No CVA tenderness Lungs:  Clear with no wheezes HEART:   Regular rate rhythm, no murmurs, no rubs, no clicks Abd:  soft, positive bowel sounds, no organomegally, no rebound, no guarding Ext:  2 plus pulses, no edema, no cyanosis, no clubbing Skin:  No rashes no nodules Neuro:  CN II through XII intact, motor grossly intact   DEVICE  Normal device function.  See PaceArt for details.   Assess/Plan: 1. VT - he has had over 30 episodes of VT in the last month, all with successful ATP. This is despite amiodarone. I have discussed the treatment options with the patient and the risks/benefits/goals/expectations of VT ablation were reviewed. He will call us if he wishes to proceed. 2. Chronic systolic heart failure - his symptoms are class 2 on maximal medical therapy. 3. ICD - his device is working normally. 4. Dyslipidemia - he will continue his crestor. No muscle aches.   Jerry Overlie Quindell Shere,MD

## 2021-02-13 NOTE — Anesthesia Procedure Notes (Signed)
Procedure Name: MAC Date/Time: 02/13/2021 8:02 AM Performed by: Lieutenant Diego, CRNA Pre-anesthesia Checklist: Patient identified, Emergency Drugs available, Suction available, Patient being monitored and Timeout performed Patient Re-evaluated:Patient Re-evaluated prior to induction Oxygen Delivery Method: Simple face mask Preoxygenation: Pre-oxygenation with 100% oxygen Induction Type: IV induction

## 2021-02-14 ENCOUNTER — Encounter (HOSPITAL_COMMUNITY): Payer: Self-pay | Admitting: Internal Medicine

## 2021-02-14 DIAGNOSIS — Z955 Presence of coronary angioplasty implant and graft: Secondary | ICD-10-CM | POA: Diagnosis not present

## 2021-02-14 DIAGNOSIS — Z79899 Other long term (current) drug therapy: Secondary | ICD-10-CM | POA: Diagnosis not present

## 2021-02-14 DIAGNOSIS — I5022 Chronic systolic (congestive) heart failure: Secondary | ICD-10-CM | POA: Diagnosis not present

## 2021-02-14 DIAGNOSIS — Z91041 Radiographic dye allergy status: Secondary | ICD-10-CM | POA: Diagnosis not present

## 2021-02-14 DIAGNOSIS — I2510 Atherosclerotic heart disease of native coronary artery without angina pectoris: Secondary | ICD-10-CM | POA: Diagnosis not present

## 2021-02-14 DIAGNOSIS — Z7982 Long term (current) use of aspirin: Secondary | ICD-10-CM | POA: Diagnosis not present

## 2021-02-14 DIAGNOSIS — I472 Ventricular tachycardia: Secondary | ICD-10-CM | POA: Diagnosis not present

## 2021-02-14 DIAGNOSIS — Z7902 Long term (current) use of antithrombotics/antiplatelets: Secondary | ICD-10-CM | POA: Diagnosis not present

## 2021-02-14 DIAGNOSIS — E785 Hyperlipidemia, unspecified: Secondary | ICD-10-CM | POA: Diagnosis not present

## 2021-02-14 MED ORDER — CARVEDILOL 25 MG PO TABS: 25.0000 mg | ORAL_TABLET | Freq: Two times a day (BID) | ORAL | Status: DC

## 2021-02-14 MED ORDER — CLOPIDOGREL BISULFATE 75 MG PO TABS: 75.0000 mg | ORAL_TABLET | Freq: Every day | ORAL | Status: DC

## 2021-02-14 MED ORDER — ACETAMINOPHEN 325 MG PO TABS: 650.0000 mg | ORAL_TABLET | ORAL | Status: AC | PRN

## 2021-02-14 NOTE — Anesthesia Postprocedure Evaluation (Signed)
Anesthesia Post Note  Patient: Jerry Farley  Procedure(s) Performed: Stephanie Coup ABLATION     Patient location during evaluation: PACU Anesthesia Type: MAC Level of consciousness: awake and alert Pain management: pain level controlled Vital Signs Assessment: post-procedure vital signs reviewed and stable Respiratory status: spontaneous breathing, nonlabored ventilation, respiratory function stable and patient connected to nasal cannula oxygen Cardiovascular status: stable and blood pressure returned to baseline Postop Assessment: no apparent nausea or vomiting Anesthetic complications: no   Encounter Notable Events  Notable Event Outcome Phase Comment  None  Intraprocedure     Last Vitals:  Vitals:   02/14/21 0417 02/14/21 0552  BP: (!) 83/47 (!) 98/53  Pulse: 73 71  Resp: 17 20  Temp: 36.7 C   SpO2: 94% 94%    Last Pain:  Vitals:   02/14/21 0417  TempSrc: Oral  PainSc:                  Jeannett Dekoning L Arrie Borrelli

## 2021-02-14 NOTE — Progress Notes (Signed)
Pt had 25 beat run of Vtach, pt is asymptomatic. MD aware, no new orders.

## 2021-02-14 NOTE — Progress Notes (Signed)
Pt is alert and oriented. Discharge instructions/ AVS given to pt. 

## 2021-02-14 NOTE — Discharge Summary (Addendum)
ELECTROPHYSIOLOGY PROCEDURE DISCHARGE SUMMARY    Patient ID: Jerry Farley,  MRN: [id number], DOB/AGE: [birth year] 75 y.o.  Admit date: 02/13/2021 Discharge date: 02/14/2021  Primary Care Physician: Baxter Hire, MD  Primary Cardiologist: None  Electrophysiologist: Dr. Caryl Comes (Dr. Lovena Le performing VT ablation)  Primary Discharge Diagnosis:  Ventricular Tachycardia  Secondary Discharge Diagnosis:  CAD Chronic systolic CHF HLD  Procedures This Admission:  1.  Electrophysiology study and radiofrequency catheter ablation of Ventricular Tachycardia on 02/13/2021 by Dr. Lovena Le.  This study demonstrated; I. Successful EP study and catheter ablation of sustained monomorphic ventricular tachycardia originating from the superior basal portion of the left ventricle near the aorto mitral continuity.   Ii. Following catheter ablation, there was no inducible ventricular tachycardia with triple extrastimuli. See CV procedure note for more details     Brief HPI: Jerry Farley is a 75 y.o. male with a history of Ventricular Tachycardia.  They have failed medical therapy with amiodarone and ranolazine. Risks, benefits, and alternatives to catheter ablation of Ventricular Tachycardia were reviewed with the patient who wished to proceed.    Hospital Course:  The patient was admitted and underwent EPS/RFCA of Ventricular Tachycardia with details as outlined above.  They were monitored on telemetry overnight which demonstrated NSR, occasional pacing, and did show runs of NSVT.  Groin was without complication on the day of discharge.  The patient was examined and considered to be stable for discharge.  Wound care and restrictions were reviewed with the patient.  The patient will be seen back by  Dr. Lovena Le  in 4 weeks.  Physical Exam: Vitals:   02/14/21 0209 02/14/21 0417 02/14/21 0552 02/14/21 0720  BP: (!) 111/54 (!) 83/47 (!) 98/53 109/69  Pulse: 70 73 71 76  Resp: '19 17 20 19  '$ Temp:   98.1 F (36.7 C)    TempSrc:  Oral  Oral  SpO2: 92% 94% 94% 94%  Weight:      Height:        GEN- The patient is well appearing, alert and oriented x 3 today.   HEENT: normocephalic, atraumatic; sclera clear, conjunctiva pink; hearing intact; oropharynx clear; neck supple  Lungs- Clear to ausculation bilaterally, normal work of breathing.  No wheezes, rales, rhonchi Heart- Regular rate and rhythm, no murmurs, rubs or gallops  GI- soft, non-tender, non-distended, bowel sounds present  Extremities- no clubbing, cyanosis, or edema; DP/PT/radial pulses 2+ bilaterally, groin without hematoma/bruit MS- no significant deformity or atrophy Skin- warm and dry, no rash or lesion Psych- euthymic mood, full affect Neuro- strength and sensation are intact   Labs:   Lab Results  Component Value Date   WBC 6.1 01/31/2021   HGB 13.4 01/31/2021   HCT 39.5 01/31/2021   MCV 96.1 01/31/2021   PLT 144 (L) 01/31/2021   No results for input(s): NA, K, CL, CO2, BUN, CREATININE, CALCIUM, PROT, BILITOT, ALKPHOS, ALT, AST, GLUCOSE in the last 168 hours.  Invalid input(s): LABALBU   Discharge Medications:  Allergies as of 02/14/2021       Reactions   Contrast Media [iodinated Diagnostic Agents] Other (See Comments)   Patient states "I don't remember exactly what happens but I know I cannot have contrast for testing."         Medication List     TAKE these medications    acetaminophen 325 MG tablet Commonly known as: TYLENOL Take 2 tablets (650 mg total) by mouth every 4 (four) hours as needed for headache  or mild pain.   amiodarone 200 MG tablet Commonly known as: PACERONE Take 2 tablets (400 mg total) by mouth daily.   aspirin 81 MG tablet Take 81 mg by mouth daily.   carvedilol 25 MG tablet Commonly known as: COREG Take 1 tablet (25 mg total) by mouth 2 (two) times daily with a meal.   clopidogrel 75 MG tablet Commonly known as: PLAVIX Take 1 tablet (75 mg total) by mouth  daily.   Entresto 24-26 MG Generic drug: sacubitril-valsartan Take 1 tablet by mouth 2 (two) times daily.   Euthyrox 125 MCG tablet Generic drug: levothyroxine TAKE 1 TABLET BY MOUTH ONCE DAILY BEFORE BREAKFAST What changed: how much to take   fish oil-omega-3 fatty acids 1000 MG capsule Take 1 g by mouth daily.   Magnesium Oxide 400 MG Caps Take 400 mg by mouth daily.   multivitamin tablet Take 1 tablet by mouth daily.   oxymetazoline 0.05 % nasal spray Commonly known as: AFRIN Place 1 spray into both nostrils 2 (two) times daily as needed for congestion.   ranolazine 500 MG 12 hr tablet Commonly known as: RANEXA Take 1 tablet by mouth twice daily   rosuvastatin 20 MG tablet Commonly known as: CRESTOR Take 1 tablet by mouth once daily   spironolactone 25 MG tablet Commonly known as: ALDACTONE Take 1/2 (one-half) tablet by mouth once daily What changed: See the new instructions.        Disposition:    Follow-up Information     Evans Lance, MD Follow up.   Specialty: Cardiology Why: on 8/24 at 0845 for post ablation follow up Contact information: [address] [address] [phone]                 Duration of Discharge Encounter: Greater than 30 minutes including physician time.  Jacalyn Lefevre, PA-C  02/14/2021 8:52 AM  EP Attending  Patient seen and examined. Agree with above. The patient is doing well after VT ablation. He has a small area of echhymosis and his tele shows a few runs of NSVT, up to 20 beats. He will be discharged home with the usual followup. If he has more VT, could consider quinidine.   Carleene Overlie Jlon Betker,MD

## 2021-02-14 NOTE — Discharge Instructions (Addendum)

## 2021-02-26 ENCOUNTER — Telehealth: Payer: Self-pay | Admitting: Internal Medicine

## 2021-02-26 NOTE — Telephone Encounter (Signed)
Spoke with pt and he asked that I speak with his wife.  Wife states she got to thinking that he might need to take that off.  Advised to go ahead and remove it as it only needed to stay for 24 hours and let's make sure there are no signs of infection.  Wife denies redness, swelling, heat or discharge to area.  Wife appreciative for call.

## 2021-02-26 NOTE — Telephone Encounter (Signed)
pt would like to know if they should remove dressing over wound prior to his appt.. please advise.

## 2021-03-19 ENCOUNTER — Other Ambulatory Visit: Payer: Self-pay

## 2021-03-19 ENCOUNTER — Ambulatory Visit (INDEPENDENT_AMBULATORY_CARE_PROVIDER_SITE_OTHER): Payer: Medicare HMO | Admitting: Internal Medicine

## 2021-03-19 VITALS — BP 90/54 | HR 70 | Ht 67.0 in | Wt 192.6 lb

## 2021-03-19 DIAGNOSIS — I472 Ventricular tachycardia, unspecified: Secondary | ICD-10-CM

## 2021-03-19 DIAGNOSIS — Z95810 Presence of automatic (implantable) cardiac defibrillator: Secondary | ICD-10-CM | POA: Diagnosis not present

## 2021-03-19 DIAGNOSIS — I428 Other cardiomyopathies: Secondary | ICD-10-CM

## 2021-03-19 MED ORDER — AMIODARONE HCL 200 MG PO TABS: 200.0000 mg | ORAL_TABLET | Freq: Every day | ORAL | 3 refills | Status: DC

## 2021-03-19 NOTE — Patient Instructions (Addendum)
Medication Instructions:  Your physician has recommended you make the following change in your medication:    REDUCE your amiodarone 200 mg-  Take one tablet by mouth daily   Labwork: None ordered.  Testing/Procedures: None ordered.  Follow-Up: Your physician wants you to follow-up in: 6 months with Dr. Caryl Comes or one of the following Advanced Practice Providers on your designated Care Team:   Tommye Standard, [place] Legrand Como "Jonni Sanger" Chalmers Cater, [place]  Remote monitoring is used to monitor your ICD from home. This monitoring reduces the number of office visits required to check your device to one time per year. It allows [place] to keep an eye on the functioning of your device to ensure it is working properly. You are scheduled for a device check from home on 03/26/2021. You may send your transmission at any time that day. If you have a wireless device, the transmission will be sent automatically. After your physician reviews your transmission, you will receive a postcard with your next transmission date.  Any Other Special Instructions Will Be Listed Below (If Applicable).  If you need a refill on your cardiac medications before your next appointment, please call your pharmacy.

## 2021-03-19 NOTE — Progress Notes (Signed)
HPI Mr. Skates returns today for followup. He is a pleasant 75 yo man with a h/o CAD, s/p PCI who developed recurrent VT despite amiodarone and underwent VT ablation. He has not had any ICD therapies in the interim. He denies chest pain or sob. He notes that his vision is not as good. He has not seen his eye doctor. He has reduced his dose of amio down to 200 mg daily.  Allergies  Allergen Reactions   Contrast Media [Iodinated Diagnostic Agents] Other (See Comments)    Patient states "I don't remember exactly what happens but I know I cannot have contrast for testing."      Current Outpatient Medications  Medication Sig Dispense Refill   acetaminophen (TYLENOL) 325 MG tablet Take 2 tablets (650 mg total) by mouth every 4 (four) hours as needed for headache or mild pain.     amiodarone (PACERONE) 200 MG tablet Take 2 tablets (400 mg total) by mouth daily. 90 tablet 3   aspirin 81 MG tablet Take 81 mg by mouth daily.     carvedilol (COREG) 25 MG tablet Take 1 tablet (25 mg total) by mouth 2 (two) times daily with a meal.     clopidogrel (PLAVIX) 75 MG tablet Take 1 tablet (75 mg total) by mouth daily.     EUTHYROX 125 MCG tablet TAKE 1 TABLET BY MOUTH ONCE DAILY BEFORE BREAKFAST 90 tablet 0   fish oil-omega-3 fatty acids 1000 MG capsule Take 1 g by mouth daily.     Magnesium Oxide 400 MG CAPS Take 400 mg by mouth daily.     Multiple Vitamin (MULTIVITAMIN) tablet Take 1 tablet by mouth daily.     oxymetazoline (AFRIN) 0.05 % nasal spray Place 1 spray into both nostrils 2 (two) times daily as needed for congestion.     ranolazine (RANEXA) 500 MG 12 hr tablet Take 1 tablet by mouth twice daily 180 tablet 3   rosuvastatin (CRESTOR) 20 MG tablet Take 1 tablet by mouth once daily 90 tablet 3   sacubitril-valsartan (ENTRESTO) 24-26 MG Take 1 tablet by mouth 2 (two) times daily. 180 tablet 3   spironolactone (ALDACTONE) 25 MG tablet Take 1/2 (one-half) tablet by mouth once daily 45 tablet 3    No current facility-administered medications for this visit.     Past Medical History:  Diagnosis Date   6948-lead    Arthritis    "maybe a little"   CAD (coronary artery disease) July 2007   CFX stent (patent Sept '07)   Cardiac defibrillator CRT-medtronic [id number]   Chronic systolic congestive heart failure (HCC)    Complication of anesthesia    "problems waking up w/valium"   High cholesterol    Hypertension    Hypothyroidism    ICD (implantable cardiac defibrillator) in place 2006, 2013   Medtronic Protecta XT-DR CRT (01/2012)   NICM (nonischemic cardiomyopathy) ([place])    ejection fraction 40-45% by echo July 2012   Non-ischemic cardiomyopathy- EF 40-45% 2D 7/14 02/11/2012   Obesity (BMI 30-39.9) 08/22/2013   Ventricular tachycardia, non-sustained ([place]) 2007   Amiodarone    ROS:   All systems reviewed and negative except as noted in the HPI.   Past Surgical History:  Procedure Laterality Date   BI-VENTRICULAR IMPLANTABLE CARDIOVERTER DEFIBRILLATOR UPGRADE N/A 02/10/2012   Procedure: BI-VENTRICULAR IMPLANTABLE CARDIOVERTER DEFIBRILLATOR UPGRADE;  Surgeon: Deboraha Sprang, MD;  Location: [facility] CATH LAB;  Service: Cardiovascular;  Laterality: N/A;   BIV ICD GENERATOR  CHANGEOUT N/A 06/24/2018   Procedure: BIV ICD GENERATOR CHANGEOUT;  Surgeon: Deboraha Sprang, MD;  Location: [place] CV LAB;  Service: Cardiovascular;  Laterality: N/A;   CARDIAC CATHETERIZATION  11/30/2001   non-ischemic cardiomyopahty with high LV end-diastolic pressure; A999333 distal L main stenosis, large osital stenosis 70%, 70% Cfx stenosis (Dr. Domenic Moras)   CARDIAC CATHETERIZATION  9/07   CFX stent patent [facility])   CARDIAC DEFIBRILLATOR PLACEMENT  09/2004; 02/10/12   ICD implanted in 2006 (Dr. Rito Ehrlich); EOL generator change in 01/2012 - CRT (BiV ICD) (Dr. Caryl Comes)   [place]   01/2006   CFX    LEFT HEART CATH AND CORONARY ANGIOGRAPHY N/A 04/13/2019   Procedure: LEFT HEART CATH  AND CORONARY ANGIOGRAPHY;  Surgeon: Jettie Booze, MD;  Location: [place] CV LAB;  Service: Cardiovascular;  Laterality: N/A;   NM MYOCAR PERF EJECTION FRACTION  05/2010   dipyridamole myoview; mod perfusion defect in apical, basal inferior, mid inferior & apical inferior regions (infarct/scar); post-stress 28%, low risk scan   TONSILLECTOMY AND ADENOIDECTOMY     "when I was a child"   TRANSTHORACIC ECHOCARDIOGRAM  01/2013   EF 40-45%, LV mildly dilated, mild LVH, mod hypokinesis of inferior myocardium, grade 1 diastolic dysfunction; mild MR   V TACH ABLATION N/A 02/13/2021   Procedure: V TACH ABLATION;  Surgeon: Evans Lance, MD;  Location: [place] CV LAB;  Service: Cardiovascular;  Laterality: N/A;     Family History  Problem Relation Age of Onset   Cancer Mother    Cancer Brother      Social History   Socioeconomic History   Marital status: Married    Spouse name: Not on file   Number of children: Not on file   Years of education: Not on file   Highest education level: Not on file  Occupational History   Occupation: retired -- [employer]  Tobacco Use   Smoking status: Former    Packs/day: 1.50    Years: 42.00    Pack years: 63.00    Types: Cigarettes    Quit date: 07/28/2003    Years since quitting: 17.6   Smokeless tobacco: Former    Types: Chew   Tobacco comments:    "didn't chew but a year or 2; don't know when I quit"  Vaping Use   Vaping Use: Never used  Substance and Sexual Activity   Alcohol use: No   Drug use: No   Sexual activity: Never  Other Topics Concern   Not on file  Social History Narrative   Not on file   Social Determinants of Health   Financial Resource Strain: Not on file  Food Insecurity: Not on file  Transportation Needs: Not on file  Physical Activity: Not on file  Stress: Not on file  Social Connections: Not on file  Intimate Partner Violence: Not on file     BP (!) 90/54   Pulse 70   Ht '5\' 7"'$  (1.702 m)   Wt  192 lb 9.6 oz (87.4 kg)   SpO2 94%   BMI 30.17 kg/m   Physical Exam:  Well appearing NAD HEENT: Unremarkable Neck:  No JVD, no thyromegally Lymphatics:  No adenopathy Back:  No CVA tenderness Lungs:  Clear with no wheezes HEART:  Regular rate rhythm, no murmurs, no rubs, no clicks Abd:  soft, positive bowel sounds, no organomegally, no rebound, no guarding Ext:  2 plus pulses, no edema, no cyanosis, no  clubbing Skin:  No rashes no nodules Neuro:  CN II through XII intact, motor grossly intact  EKG - nsr with ventricular pacing  DEVICE  Normal device function.  See PaceArt for details. NSVT  Assess/Plan:  VT - he is s/p ablation. He will continue amio 200 mg daily. Decreased vision - he will followup with his eye MD. If needed we can stop the amiodarone. CAD - he denies anginal symptoms. Chronic systolic heart failure -his symptoms remain class 2. He will continue his current GDMT.  Carleene Overlie Chee Kinslow,MD

## 2021-03-20 ENCOUNTER — Other Ambulatory Visit: Payer: Self-pay | Admitting: Internal Medicine

## 2021-03-26 ENCOUNTER — Ambulatory Visit (INDEPENDENT_AMBULATORY_CARE_PROVIDER_SITE_OTHER): Payer: Medicare HMO

## 2021-03-26 DIAGNOSIS — I428 Other cardiomyopathies: Secondary | ICD-10-CM

## 2021-03-26 LAB — CUP PACEART REMOTE DEVICE CHECK
Battery Remaining Longevity: 40 mo
Battery Remaining Percentage: 55 %
Battery Voltage: 2.93 V
Brady Statistic AP VP Percent: 88 %
Brady Statistic AP VS Percent: 5.8 %
Brady Statistic AS VP Percent: 1 %
Brady Statistic AS VS Percent: 1 %
Brady Statistic RA Percent Paced: 91 %
Date Time Interrogation Session: 20220831020015
HighPow Impedance: 75 Ohm
HighPow Impedance: 75 Ohm
Implantable Lead Implant Date: 20130717
Implantable Lead Implant Date: 20130717
Implantable Lead Implant Date: 20130717
Implantable Lead Location: 753858
Implantable Lead Location: 753859
Implantable Lead Location: 753860
Implantable Lead Model: 4396
Implantable Lead Model: 5076
Implantable Lead Model: 6935
Implantable Pulse Generator Implant Date: 20191129
Lead Channel Impedance Value: 380 Ohm
Lead Channel Impedance Value: 510 Ohm
Lead Channel Impedance Value: 760 Ohm
Lead Channel Pacing Threshold Amplitude: 0.75 V
Lead Channel Pacing Threshold Amplitude: 0.75 V
Lead Channel Pacing Threshold Amplitude: 1.25 V
Lead Channel Pacing Threshold Pulse Width: 0.5 ms
Lead Channel Pacing Threshold Pulse Width: 0.5 ms
Lead Channel Pacing Threshold Pulse Width: 0.7 ms
Lead Channel Sensing Intrinsic Amplitude: 1.9 mV
Lead Channel Sensing Intrinsic Amplitude: 11.7 mV
Lead Channel Setting Pacing Amplitude: 2 V
Lead Channel Setting Pacing Amplitude: 2.25 V
Lead Channel Setting Pacing Amplitude: 2.5 V
Lead Channel Setting Pacing Pulse Width: 0.5 ms
Lead Channel Setting Pacing Pulse Width: 0.7 ms
Lead Channel Setting Sensing Sensitivity: 0.5 mV
Pulse Gen Serial Number: 9812589

## 2021-03-27 DIAGNOSIS — E034 Atrophy of thyroid (acquired): Secondary | ICD-10-CM | POA: Diagnosis not present

## 2021-04-03 ENCOUNTER — Telehealth: Payer: Self-pay

## 2021-04-03 DIAGNOSIS — E7800 Pure hypercholesterolemia, unspecified: Secondary | ICD-10-CM | POA: Diagnosis not present

## 2021-04-03 DIAGNOSIS — Z95810 Presence of automatic (implantable) cardiac defibrillator: Secondary | ICD-10-CM | POA: Diagnosis not present

## 2021-04-03 DIAGNOSIS — Z125 Encounter for screening for malignant neoplasm of prostate: Secondary | ICD-10-CM | POA: Diagnosis not present

## 2021-04-03 DIAGNOSIS — I2510 Atherosclerotic heart disease of native coronary artery without angina pectoris: Secondary | ICD-10-CM | POA: Diagnosis not present

## 2021-04-03 DIAGNOSIS — E034 Atrophy of thyroid (acquired): Secondary | ICD-10-CM | POA: Diagnosis not present

## 2021-04-03 NOTE — Telephone Encounter (Signed)
   Pt's wife returning call, she gave pt's cell# [phone] for callback

## 2021-04-03 NOTE — Telephone Encounter (Signed)
Successful telephone call with patient. Jerry Farley is made aware of ATP x 3 yesterday. Medications reviewed and dosage compliance confirmed. Jerry Farley states he is unsure of what he was doing at time of ATP but denies any symptoms or complaints. Shock plan is reviewed and [place] driving restrictions reinforced. Patient is currently restricted to driving from previous VT/VF therapy < 6 months ago. Will route high priority to Dr. Caryl Comes for additional recommendation.

## 2021-04-03 NOTE — Telephone Encounter (Signed)
Patient called back to speak with a nurse, I let patient wife know someone will call her back

## 2021-04-03 NOTE — Telephone Encounter (Signed)
"  Alert remote reviewed. Normal device function.   There were 31 NSVT arrhythmias detected, there was a short NSVT in the presenting EGM.   There was one VT that was successfully converted after three bursts of ATP, sent to triage. Next remote 06/25/2021"  Transmission reviewed. ATP x 3 04/02/21 for VT detected at 7:30 am. Hip[employer] compliant VM message left requesting call back to [phone] for assess for s/s, med compliance, and to review [place] driving restrictions.

## 2021-04-03 NOTE — Telephone Encounter (Signed)
Returned call to wife (on [place]). Per wife patient currently at appointment with PCP. Discuss transmission with ATP. Wife states patient did not complain during time of event. Wife to have patient call back to review meds and [place] driving restrictions. Will await call back.

## 2021-04-07 NOTE — Telephone Encounter (Signed)
Ladies and [place],  I think for now Truddie Crumble we continue on current plan   GT did ablation 7/22 which was "successful"  may need to think of doing something again Thanks SK

## 2021-04-08 NOTE — Progress Notes (Signed)
Remote ICD transmission.   

## 2021-04-11 ENCOUNTER — Telehealth: Payer: Self-pay

## 2021-04-11 NOTE — Telephone Encounter (Signed)
Merlin alert of successful ATP therapy. Event occurred 9/15 @ 07:06 EGM shows sustained VT, rate 133 falling in to the VT-1 zone.  ATP delivered x1 successfully slowing rhythm. There are 30 additional NSVT episodes.  Patient called and reports he was laying in bed awake and did not have any symptoms. States yesterday and today he has felt well and has no complaints. Compliant with medications on file. Advised I will forward to Dr. Caryl Comes for review and recommendations. Appreciative of call.  Shock plan reviewed with patient & [place] DMV driving restrictions x6 months reviewed with patient with verbal understanding.

## 2021-04-14 NOTE — Telephone Encounter (Signed)
Patient returning call. VT/ATP episodes 04/11/21 reviewed with patient. Patient states he was unaware of any fast heart rates or feelings of being unwell. He feels he was in bed resting during the 21:04 and 22:25 ATP episodes. Taking all medications as prescribed including amio and coreg. Confirmed patient is aware of [place] driving restrictions and shock plan. ED precautions provided. Will await recommendations from

## 2021-04-14 NOTE — Telephone Encounter (Signed)
"  Merlin alert for sustained VT with successful ATP therapy. 9/16 starting at 03:36 to 07:08, ATP delivered a total of 9x converting rhythm (APTx2, ATPx1, ATPx3, AATPx1, ATPx2) Following are 16 NSVT without EGM's 9/16 @ 17:45 there are four more sustained VT, ATPx1, ATPx1,ATPx1, ATPx3.  Followed by 7 NSVT 9/16 @ 21:04 ATPx5 followed by 2 NSVT 9/16 @ 22:25 ATPx1 followed by an additional 5 NSVT. Rates 122-141, Coreg, Amiodarone prescribed"  Unsuccessful telephone call to patient to discuss. Of note patient received ATPx1 day prior. Per Dr. Lovena Le watchful waiting at that time. Will route to Dr. Lovena Le for additional review of new episodes.

## 2021-04-16 ENCOUNTER — Telehealth: Payer: Self-pay

## 2021-04-16 NOTE — Telephone Encounter (Signed)
There are 30 new NSVT.  1 VT-1 episode 9/20 @ 10:33 showing slow sustained VT, rate 123, ATP delivered x1 successfully converting to AP/BP.  s/p VT ablation in July, asymptomatic with recent events.  Route to triage for ongoing ectopy.  Spoke to patients wife, states he is not home at this time but has been doing well. Advised of driving restrictions that start over 04/15/21 x^ months per [place] DMV.   Dr. Caryl Comes ~ Is it possible to reprogram device to decrease daily alerts? Or just continue to monitor? Patient is aware to call if any symptoms arise.

## 2021-04-18 NOTE — Telephone Encounter (Signed)
Dr. Caryl Comes, Would you like to still receive notification on ATP or shock only?

## 2021-04-22 ENCOUNTER — Ambulatory Visit: Payer: Medicare HMO | Admitting: Internal Medicine

## 2021-04-22 ENCOUNTER — Other Ambulatory Visit: Payer: Self-pay

## 2021-04-22 ENCOUNTER — Encounter: Payer: Self-pay | Admitting: Internal Medicine

## 2021-04-22 ENCOUNTER — Telehealth: Payer: Self-pay

## 2021-04-22 VITALS — BP 110/70 | HR 70 | Ht 67.0 in | Wt 193.0 lb

## 2021-04-22 DIAGNOSIS — I472 Ventricular tachycardia, unspecified: Secondary | ICD-10-CM

## 2021-04-22 DIAGNOSIS — Z95810 Presence of automatic (implantable) cardiac defibrillator: Secondary | ICD-10-CM

## 2021-04-22 DIAGNOSIS — I428 Other cardiomyopathies: Secondary | ICD-10-CM

## 2021-04-22 DIAGNOSIS — I5022 Chronic systolic (congestive) heart failure: Secondary | ICD-10-CM

## 2021-04-22 MED ORDER — AMIODARONE HCL 200 MG PO TABS: ORAL_TABLET | ORAL | 0 refills | Status: DC

## 2021-04-22 NOTE — Telephone Encounter (Signed)
**Note De-Identified Makayela Secrest Obfuscation** A Entresto Tier Exception form received Janeli Lewison fax from [place]. I have completed the form and emailed it to Dr Olin Pia nurse so she can obtain his signature, date it, and to fax to [facility] at the fax number written on the cover letter included or to place in the to be faxed basket in Medical Record to be faxed.

## 2021-04-22 NOTE — Progress Notes (Signed)
Patient Care Team: Baxter Hire, MD as PCP - General (Internal Medicine)   HPI  Jerry Farley is a 75 y.o. male Seen in followup for ICD implanted for polymorphic ventricular tachycardia   remotely for which he underwent device generator replacement 7/13. ERI and underwent generator replacement 10/19 and for reasons that I do not recall received a St Jude replacement of a Medtronic device History of 6949-lead which was revised the time of CRT upgrade receiving an atrial lead and an LV lead.   coronary artery disease with prior stenting of his circumflex. Unfortunately, surveillance laboratories on amiodarone demonstrated elevated transaminases and his amiodarone has been  held.  Back On Amiodarone and adjunctive ranolazine   7/22 underwent VT ablation with elimination of inducible ventricular tachycardia     Today, he is feeling well overall aside from constant fatigue. Since his last visit he has not been feeling any palpitations.  And this despite frequent (sometimes greater than 20/day) paced terminated VT  Had significant loss of vision following his recent V Tach ablation. At this time he is unable to read letters, and his vision has not improved.  For treatment he is using Visine, which he states seems to help him.   While walking, his breathing is fine and he denies exertional chest pain.   The patient denies nocturnal dyspnea, orthopnea or peripheral edema.  There have been no lightheadedness or syncope.    Patient denies symptoms of GI intolerance, sun sensitivity, neurological symptoms attributable to amiodarone.    DATE TEST EF   2007 LHC 25% Cx Stent   2011 myoview   29 % Inferoapical scar  7/14 Echo    40-45% % LAE mild  12/16 Echo   40-45%   9/20 LHC 25-35% Cx stent patent O/w nonobstructive      Date Cr K TSH Hgb LFTs PFTs  11/16    0.48  28    6/17    1.6  30    8/18   4.96 (on synthroid)  30   12/18 1.1 4.8 2.19  33   8/19 1.0 4.6 3.06   32   1/20 1.1 4.4 5.95  30   9/20 1.21 4.3 4.22  41   2/21 1.2 4.4 9.847 ( dose 88>>100) 13.5 37   6/21 1.2` 4.4 6.7     1/22   3.85<<8.17  109<<88    2/22 (CE) 1.0 4.3 2.507 13.1 65   4/22 (CE) 1.1 4.1   88   9/22 (CE) 1.1 4.3 1.999  31        Past Medical History:  Diagnosis Date   6948-lead    Arthritis    "maybe a little"   CAD (coronary artery disease) July 2007   CFX stent (patent Sept '07)   Cardiac defibrillator CRT-medtronic 01/29/3004   Chronic systolic congestive heart failure (HCC)    Complication of anesthesia    "problems waking up w/valium"   High cholesterol    Hypertension    Hypothyroidism    ICD (implantable cardiac defibrillator) in place 2006, 2013   Medtronic Protecta XT-DR CRT (01/2012)   NICM (nonischemic cardiomyopathy) ([place])    ejection fraction 40-45% by echo July 2012   Non-ischemic cardiomyopathy- EF 40-45% 2D 7/14 02/11/2012   Obesity (BMI 30-39.9) 08/22/2013   Ventricular tachycardia, non-sustained ([place]) 2007   Amiodarone    Past Surgical History:  Procedure Laterality Date   BI-VENTRICULAR IMPLANTABLE CARDIOVERTER DEFIBRILLATOR UPGRADE N/A 02/10/2012  Procedure: BI-VENTRICULAR IMPLANTABLE CARDIOVERTER DEFIBRILLATOR UPGRADE;  Surgeon: Deboraha Sprang, MD;  Location: [facility] C[facility] LAB;  Service: Cardiovascular;  Laterality: N/A;   BIV ICD GENERATOR CHANGEOUT N/A 06/24/2018   Procedure: BIV ICD GENERATOR CHANGEOUT;  Surgeon: Deboraha Sprang, MD;  Location: [place] CV LAB;  Service: Cardiovascular;  Laterality: N/A;   CARDIAC CATHETERIZATION  11/30/2001   non-ischemic cardiomyopahty with high LV end-diastolic pressure; 81% distal L main stenosis, large osital stenosis 70%, 70% Cfx stenosis (Dr. Domenic Moras)   CARDIAC CATHETERIZATION  9/07   CFX stent patent [facility])   CARDIAC DEFIBRILLATOR PLACEMENT  09/2004; 02/10/12   ICD implanted in 2006 (Dr. Rito Ehrlich); EOL generator change in 01/2012 - CRT (BiV ICD) (Dr. Caryl Comes)   [place]   01/2006   CFX    LEFT HEART CATH AND CORONARY ANGIOGRAPHY N/A 04/13/2019   Procedure: LEFT HEART CATH AND CORONARY ANGIOGRAPHY;  Surgeon: Jettie Booze, MD;  Location: [place] CV LAB;  Service: Cardiovascular;  Laterality: N/A;   NM MYOCAR PERF EJECTION FRACTION  05/2010   dipyridamole myoview; mod perfusion defect in apical, basal inferior, mid inferior & apical inferior regions (infarct/scar); post-stress 28%, low risk scan   TONSILLECTOMY AND ADENOIDECTOMY     "when I was a child"   TRANSTHORACIC ECHOCARDIOGRAM  01/2013   EF 40-45%, LV mildly dilated, mild LVH, mod hypokinesis of inferior myocardium, grade 1 diastolic dysfunction; mild MR   V TACH ABLATION N/A 02/13/2021   Procedure: V TACH ABLATION;  Surgeon: Evans Lance, MD;  Location: [place] CV LAB;  Service: Cardiovascular;  Laterality: N/A;    Current Outpatient Medications  Medication Sig Dispense Refill   acetaminophen (TYLENOL) 325 MG tablet Take 2 tablets (650 mg total) by mouth every 4 (four) hours as needed for headache or mild pain.     amiodarone (PACERONE) 200 MG tablet Take 1 tablet (200 mg total) by mouth daily. 90 tablet 3   aspirin 81 MG tablet Take 81 mg by mouth daily.     carvedilol (COREG) 25 MG tablet Take 1 tablet (25 mg total) by mouth 2 (two) times daily with a meal.     clopidogrel (PLAVIX) 75 MG tablet Take 1 tablet (75 mg total) by mouth daily.     EUTHYROX 125 MCG tablet TAKE 1 TABLET BY MOUTH ONCE DAILY BEFORE BREAKFAST 90 tablet 1   fish oil-omega-3 fatty acids 1000 MG capsule Take 1 g by mouth daily.     Magnesium Oxide 400 MG CAPS Take 400 mg by mouth daily.     Multiple Vitamin (MULTIVITAMIN) tablet Take 1 tablet by mouth daily.     oxymetazoline (AFRIN) 0.05 % nasal spray Place 1 spray into both nostrils 2 (two) times daily as needed for congestion.     ranolazine (RANEXA) 500 MG 12 hr tablet Take 1 tablet by mouth twice daily 180 tablet 3   rosuvastatin (CRESTOR) 20 MG  tablet Take 1 tablet by mouth once daily 90 tablet 3   sacubitril-valsartan (ENTRESTO) 24-26 MG Take 1 tablet by mouth 2 (two) times daily. 180 tablet 3   spironolactone (ALDACTONE) 25 MG tablet Take 1/2 (one-half) tablet by mouth once daily 45 tablet 3   No current facility-administered medications for this visit.    Allergies  Allergen Reactions   Contrast Media [Iodinated Diagnostic Agents] Other (See Comments)    Patient states "I don't remember exactly what happens but I know I cannot have contrast for  testing."     Review of Systems negative except from HPI and PMH  Physical Exam BP 110/70   Pulse 70   Ht 5\' 7"  (1.702 m)   Wt 193 lb (87.5 kg)   SpO2 98%   BMI 30.23 kg/m  Well developed and well nourished in no acute distress HENT normal Neck supple with JVP-flat Clear Device pocket well healed; without hematoma or erythema.  There is no tethering  Regular rate and rhythm, no murmur Abd-soft with active BS No Clubbing cyanosis  edema Skin-warm and dry A & Oriented  Grossly normal sensory and motor function  ECG AV pacing at 78 Intervals 20/17/52 Upright QRS lead V1 and qrS in lead V1     Assessment and  Plan  Polymorphic ventricular tachycardia-- intercurrent nonsustained episodes  Nonischemic cardiomyopathy  Ventricular tachycardia-monomorphic-paced terminated  Coronary artery disease with prior stenting  High Risk Medication Surveillance Amiodarone  Elevated transaminases  ? Amio    Syncope  Heart failure-chronic-mixed  CRT-D St Jude from Medtronic   Hypothyroidism treated ( dose recently changed from 100>>112)   Visual changes following ablation  Frequent intercurrent ventricular tachycardia (frequently greater than 10-20 episodes daily) successfully terminated with ATP mostly with 1 burst sometimes up to 7 (programmed to 12) I suspect this relates to significant damage to the ablation circuit resulting in a slowed conduction velocity.   Hopefully, amiodarone can further slow conduction; alternatively another 1 agent could be used adjunctively instead of ranolazine to slow conduction.  Right now I am reluctant to think about another ablation procedure until we have a better understanding of what happened to his vision in the past him to follow-up with ophthalmology to see if we can hypothesize a mechanism to explain the findings.  For now, we will increase his amiodarone to 400 twice daily x2 weeks followed by 400 a day x2 weeks back to 200 mg a day.  We will plan to reevaluate in about 6 weeks.  He is euvolemic.  We will continue his Aldactone 12.5.  For his cardiomyopathy we will continue carvedilol 25 twice daily Entresto 24/26 and continue him on DAPT     I,Mathew Stumpf,acting as a scribe for [name], MD.,have documented all relevant documentation on the behalf of [name], MD,as directed by  [name], MD while in the presence of [name], MD.   I, [name], MD, have reviewed all documentation for this visit. The documentation on 04/22/21 for the exam, diagnosis, procedures, and orders are all accurate and complete.

## 2021-04-22 NOTE — Patient Instructions (Addendum)
Medication Instructions:  Your physician has recommended you make the following change in your medication:   ** Increase Amiodarone to 400mg  (2 - 200mg  tablets) by mouth twice daily x 2 weeks then  ** Amiodarone 400mg  (2 - 200mg  tablets) by mouth daily x 2 weeks then  ** Return to Amiodarone 200mg  (1 tablet) daily after that   *If you need a refill on your cardiac medications before your next appointment, please call your pharmacy*   Lab Work: None ordered.   If you have labs (blood work) drawn today and your tests are completely normal, you will receive your results only by: [place] (if you have MyChart) OR A paper copy in the mail If you have any lab test that is abnormal or we need to change your treatment, we will call you to review the results.   Testing/Procedures: None ordered.    Follow-Up: At [facility], you and your health needs are our priority.  As part of our continuing mission to provide you with exceptional heart care, we have created designated Provider Care Teams.  These Care Teams include your primary Cardiologist (physician) and Advanced Practice Providers (APPs -  Physician Assistants and Nurse Practitioners) who all work together to provide you with the care you need, when you need it.  We recommend signing up for the patient portal called "MyChart".  Sign up information is provided on this After Visit Summary.  MyChart is used to connect with patients for Virtual Visits (Telemedicine).  Patients are able to view lab/test results, encounter notes, upcoming appointments, etc.  Non-urgent messages can be sent to your provider as well.   To learn more about what you can do with MyChart, go to [email or web address].    Your next appointment:   6 weeks with Dr Caryl Comes  10.31.22 at  1215pm

## 2021-04-22 NOTE — Telephone Encounter (Signed)
Form signed and placed with medical records to be faxed and scanned.

## 2021-05-09 DIAGNOSIS — Z0100 Encounter for examination of eyes and vision without abnormal findings: Secondary | ICD-10-CM | POA: Diagnosis not present

## 2021-05-09 DIAGNOSIS — H2513 Age-related nuclear cataract, bilateral: Secondary | ICD-10-CM | POA: Diagnosis not present

## 2021-05-20 ENCOUNTER — Other Ambulatory Visit: Payer: Self-pay | Admitting: Internal Medicine

## 2021-05-26 ENCOUNTER — Encounter: Payer: Self-pay | Admitting: Internal Medicine

## 2021-05-26 ENCOUNTER — Other Ambulatory Visit: Payer: Self-pay

## 2021-05-26 ENCOUNTER — Ambulatory Visit: Payer: Medicare HMO | Admitting: Internal Medicine

## 2021-05-26 VITALS — BP 120/60 | HR 70 | Ht 67.0 in | Wt 190.6 lb

## 2021-05-26 DIAGNOSIS — E039 Hypothyroidism, unspecified: Secondary | ICD-10-CM | POA: Diagnosis not present

## 2021-05-26 DIAGNOSIS — I5022 Chronic systolic (congestive) heart failure: Secondary | ICD-10-CM | POA: Diagnosis not present

## 2021-05-26 DIAGNOSIS — Z955 Presence of coronary angioplasty implant and graft: Secondary | ICD-10-CM

## 2021-05-26 DIAGNOSIS — Z79899 Other long term (current) drug therapy: Secondary | ICD-10-CM

## 2021-05-26 DIAGNOSIS — H5989 Other postprocedural complications and disorders of eye and adnexa, not elsewhere classified: Secondary | ICD-10-CM | POA: Diagnosis not present

## 2021-05-26 DIAGNOSIS — I428 Other cardiomyopathies: Secondary | ICD-10-CM

## 2021-05-26 DIAGNOSIS — I5042 Chronic combined systolic (congestive) and diastolic (congestive) heart failure: Secondary | ICD-10-CM | POA: Diagnosis not present

## 2021-05-26 DIAGNOSIS — R55 Syncope and collapse: Secondary | ICD-10-CM

## 2021-05-26 DIAGNOSIS — Z95810 Presence of automatic (implantable) cardiac defibrillator: Secondary | ICD-10-CM

## 2021-05-26 DIAGNOSIS — I2510 Atherosclerotic heart disease of native coronary artery without angina pectoris: Secondary | ICD-10-CM | POA: Diagnosis not present

## 2021-05-26 DIAGNOSIS — I472 Ventricular tachycardia, unspecified: Secondary | ICD-10-CM | POA: Diagnosis not present

## 2021-05-26 MED ORDER — MEXILETINE HCL 150 MG PO CAPS: 150.0000 mg | ORAL_CAPSULE | Freq: Two times a day (BID) | ORAL | 3 refills | Status: DC

## 2021-05-26 NOTE — Progress Notes (Signed)
Patient Care Team: Baxter Hire, MD as PCP - General (Internal Medicine)   HPI  Jerry Farley is a 75 y.o. male Seen in followup for ICD implanted for polymorphic ventricular tachycardia   remotely for which he underwent device generator replacement 7/13. ERI and underwent generator replacement 10/19 and for reasons that I do not recall received a St Jude replacement of a Medtronic device History of 6949-lead which was revised the time of CRT upgrade receiving an atrial lead and an LV lead.   coronary artery disease with prior stenting of his circumflex. Surveillance laboratories on amiodarone demonstrated elevated transaminases and his amiodarone has been  held.  Back On Amiodarone and adjunctive ranolazine   7/22 underwent VT ablation with elimination of inducible ventricular tachycardia; significant post ablation VT burden and when seen 9/22, especially given the visual issues that found his prior VT ablation, unable to read letters etc., elected to pursue aggressive medical therapy with reloading of amiodarone and anticipate consideration of an adjunctive antiarrhythmic theray   Today, his vision has not improved since his last visit. His vision changed after the ablation procedure. He was told the vision change was due to dry eye and was given eyedrops. However, the effects did not last and he is still unable to read most signs. He also experiences bilateral leg weakness which limits his walking. 3-4 weeks ago, he experienced a dizziness episode. He uses nasal spray to alleviate shortness of breath.   The patient denies chest pain, nocturnal dyspnea, orthopnea or peripheral edema.  There have been no palpitations or syncope.  Complains of poor vision, bilateral leg weakness, dizziness, and fatigue   Patient denies symptoms of GI intolerance, sun sensitivity, neurological symptoms attributable to amiodarone.    DATE TEST EF   2007 LHC 25% Cx Stent   2011 myoview   29 %  Inferoapical scar  7/14 Echo    40-45% % LAE mild  12/16 Echo   40-45%   9/20 LHC 25-35% Cx stent patent O/w nonobstructive      Date Cr K TSH Hgb LFTs PFTs  11/16    0.48  28    6/17    1.6  30    8/18   4.96 (on synthroid)  30   12/18 1.1 4.8 2.19  33   8/19 1.0 4.6 3.06  32   1/20 1.1 4.4 5.95  30   9/20 1.21 4.3 4.22  41   2/21 1.2 4.4 9.847 ( dose 88>>100) 13.5 37   6/21 1.2` 4.4 6.7     1/22   3.85<<8.17  109<<88    2/22 (CE) 1.0 4.3 2.507 13.1 65   4/22 (CE) 1.1 4.1   88   9/22 (CE) 1.1 4.3 1.999  31        Past Medical History:  Diagnosis Date   6948-lead    Arthritis    "maybe a little"   CAD (coronary artery disease) July 2007   CFX stent (patent Sept '07)   Cardiac defibrillator CRT-medtronic 08/01/1094   Chronic systolic congestive heart failure (HCC)    Complication of anesthesia    "problems waking up w/valium"   High cholesterol    Hypertension    Hypothyroidism    ICD (implantable cardiac defibrillator) in place 2006, 2013   Medtronic Protecta XT-DR CRT (01/2012)   NICM (nonischemic cardiomyopathy) ([place])    ejection fraction 40-45% by echo July 2012   Non-ischemic cardiomyopathy- EF 40-45%  2D 7/14 02/11/2012   Obesity (BMI 30-39.9) 08/22/2013   Ventricular tachycardia, non-sustained 2007   Amiodarone    Past Surgical History:  Procedure Laterality Date   BI-VENTRICULAR IMPLANTABLE CARDIOVERTER DEFIBRILLATOR UPGRADE N/A 02/10/2012   Procedure: BI-VENTRICULAR IMPLANTABLE CARDIOVERTER DEFIBRILLATOR UPGRADE;  Surgeon: Deboraha Sprang, MD;  Location: [facility] CATH LAB;  Service: Cardiovascular;  Laterality: N/A;   BIV ICD GENERATOR CHANGEOUT N/A 06/24/2018   Procedure: BIV ICD GENERATOR CHANGEOUT;  Surgeon: Deboraha Sprang, MD;  Location: [place] CV LAB;  Service: Cardiovascular;  Laterality: N/A;   CARDIAC CATHETERIZATION  11/30/2001   non-ischemic cardiomyopahty with high LV end-diastolic pressure; 27% distal L main stenosis, large osital stenosis 70%, 70% Cfx  stenosis (Dr. Domenic Moras)   CARDIAC CATHETERIZATION  9/07   CFX stent patent [facility])   CARDIAC DEFIBRILLATOR PLACEMENT  09/2004; 02/10/12   ICD implanted in 2006 (Dr. Rito Ehrlich); EOL generator change in 01/2012 - CRT (BiV ICD) (Dr. Caryl Comes)   [place]   01/2006   CFX    LEFT HEART CATH AND CORONARY ANGIOGRAPHY N/A 04/13/2019   Procedure: LEFT HEART CATH AND CORONARY ANGIOGRAPHY;  Surgeon: Jettie Booze, MD;  Location: [place] CV LAB;  Service: Cardiovascular;  Laterality: N/A;   NM MYOCAR PERF EJECTION FRACTION  05/2010   dipyridamole myoview; mod perfusion defect in apical, basal inferior, mid inferior & apical inferior regions (infarct/scar); post-stress 28%, low risk scan   TONSILLECTOMY AND ADENOIDECTOMY     "when I was a child"   TRANSTHORACIC ECHOCARDIOGRAM  01/2013   EF 40-45%, LV mildly dilated, mild LVH, mod hypokinesis of inferior myocardium, grade 1 diastolic dysfunction; mild MR   V TACH ABLATION N/A 02/13/2021   Procedure: V TACH ABLATION;  Surgeon: Evans Lance, MD;  Location: [place] CV LAB;  Service: Cardiovascular;  Laterality: N/A;    Current Outpatient Medications  Medication Sig Dispense Refill   acetaminophen (TYLENOL) 325 MG tablet Take 2 tablets (650 mg total) by mouth every 4 (four) hours as needed for headache or mild pain.     amiodarone (PACERONE) 200 MG tablet Take 1 tablet (200 mg total) by mouth daily. 90 tablet 3   amiodarone (PACERONE) 200 MG tablet Take 400mg  - 2 tablets by mouth twice daily x 2 weeks then 400mg  - 2 tablets by mouth once daily x 2 weeks then return to 200mg  - 1 tablet by mouth daily. 84 tablet 0   aspirin 81 MG tablet Take 81 mg by mouth daily.     carvedilol (COREG) 25 MG tablet Take 1 tablet (25 mg total) by mouth 2 (two) times daily with a meal.     clopidogrel (PLAVIX) 75 MG tablet Take 1 tablet by mouth once daily 90 tablet 3   EUTHYROX 125 MCG tablet TAKE 1 TABLET BY MOUTH ONCE DAILY BEFORE  BREAKFAST 90 tablet 1   fish oil-omega-3 fatty acids 1000 MG capsule Take 1 g by mouth daily.     Magnesium Oxide 400 MG CAPS Take 400 mg by mouth daily.     Multiple Vitamin (MULTIVITAMIN) tablet Take 1 tablet by mouth daily.     oxymetazoline (AFRIN) 0.05 % nasal spray Place 1 spray into both nostrils 2 (two) times daily as needed for congestion.     ranolazine (RANEXA) 500 MG 12 hr tablet Take 1 tablet by mouth twice daily 180 tablet 3   rosuvastatin (CRESTOR) 20 MG tablet Take 1 tablet by mouth once daily 90 tablet  3   sacubitril-valsartan (ENTRESTO) 24-26 MG Take 1 tablet by mouth 2 (two) times daily. 180 tablet 3   spironolactone (ALDACTONE) 25 MG tablet Take 1/2 (one-half) tablet by mouth once daily 45 tablet 3   No current facility-administered medications for this visit.    Allergies  Allergen Reactions   Contrast Media [Iodinated Diagnostic Agents] Other (See Comments)    Patient states "I don't remember exactly what happens but I know I cannot have contrast for testing."     Review of Systems negative except from HPI and PMH  Physical Exam BP 120/60   Pulse 70   Ht 5\' 7"  (1.702 m)   Wt 190 lb 9.6 oz (86.5 kg)   SpO2 98%   BMI 29.85 kg/m  Well developed and well nourished in no acute distress HENT normal Neck supple with JVP-flat Clear Device pocket well healed; without hematoma or erythema.  There is no tethering  Regular rate and rhythm, no gallop No murmur Abd-soft with active BS No Clubbing cyanosis edema Skin-warm and dry A & Oriented  Grossly normal sensory and motor function  ECG AV pacing 70 16/16/52    Assessment and  Plan  Polymorphic ventricular tachycardia-- intercurrent nonsustained episodes  Nonischemic cardiomyopathy  Ventricular tachycardia-monomorphic-paced terminated  Coronary artery disease with prior stenting  High Risk Medication Surveillance Amiodarone  Elevated transaminases  ? Amio    Syncope  Heart  failure-chronic-mixed  CRT-D St Jude from Medtronic   Hypothyroidism treated ( dose recently changed from 100>>112)   Visual changes following ablation  Recurrent VT despite amio and ranolazine, continue at 200 daily and 500 bid,  needs adjunctive therapy given recurrent VT  not sure what his insurance will cover.  The risk of ablation with the periprocedural ( presumed) stroke is worrisome Euvolemic, continue aldactone 12.5   For his CM, continue entr4esto 24/26. But may be cost prohibitive and so will give them infor for [place]  Some chest discomfort, may need repeat cath       [facility] as a scribe for [name], MD.,have documented all relevant documentation on the behalf of [name], MD,as directed by  [name], MD while in the presence of [name], MD.  I, [name], MD, have reviewed all documentation for this visit. The documentation on 05/26/21 for the exam, diagnosis, procedures, and orders are all accurate and complete.

## 2021-05-26 NOTE — Patient Instructions (Signed)
Medication Instructions:  Your physician has recommended you make the following change in your medication:   ** Begin Mexiletine 150mg  -1 tablet by mouth twice daily.   *If you need a refill on your cardiac medications before your next appointment, please call your pharmacy*   Lab Work: None ordered.  If you have labs (blood work) drawn today and your tests are completely normal, you will receive your results only by: [place] (if you have MyChart) OR A paper copy in the mail If you have any lab test that is abnormal or we need to change your treatment, we will call you to review the results.   Testing/Procedures: None ordered.    Follow-Up: At [facility], you and your health needs are our priority.  As part of our continuing mission to provide you with exceptional heart care, we have created designated Provider Care Teams.  These Care Teams include your primary Cardiologist (physician) and Advanced Practice Providers (APPs -  Physician Assistants and Nurse Practitioners) who all work together to provide you with the care you need, when you need it.  We recommend signing up for the patient portal called "MyChart".  Sign up information is provided on this After Visit Summary.  MyChart is used to connect with patients for Virtual Visits (Telemedicine).  Patients are able to view lab/test results, encounter notes, upcoming appointments, etc.  Non-urgent messages can be sent to your provider as well.   To learn more about what you can do with MyChart, go to [email or web address].    Your next appointment:   6 month(s)  The format for your next appointment:   In Person  Provider:   [name], MD   Other Instructions Please send a remote transmission  to device clinic in 3 weeks

## 2021-05-29 DIAGNOSIS — Z0100 Encounter for examination of eyes and vision without abnormal findings: Secondary | ICD-10-CM | POA: Diagnosis not present

## 2021-06-02 ENCOUNTER — Telehealth: Payer: Self-pay | Admitting: Internal Medicine

## 2021-06-02 NOTE — Telephone Encounter (Signed)
Yes amiodarone can cause vision issues. Will defer to MD regarding potential alternative anti-arrhythmics.

## 2021-06-02 NOTE — Telephone Encounter (Signed)
   Pt's wife requesting to speak with Dr. Olin Pia nurse, she said to call her at this # [phone]

## 2021-06-02 NOTE — Telephone Encounter (Signed)
Spoke with pt's wife,  DPR who states pt saw a new "eye Dr, Dr Leeanne Deed in [place]" last week who states pt's decreased vision is coming from Amiodarone.  Provider recommending pt stop Amiodarone as vision will continue to deteriorate with medication but advised that would be up to pt and cardiologist.  Pt's wife advised will forward to Dr Caryl Comes and pharmD for further review and recommendations.  Pt's wife verbalizes understanding and thanked Therapist, sports for the call.

## 2021-06-02 NOTE — Telephone Encounter (Signed)
Spoke with Dr Caryl Comes and he states he will contact pt re: eye doctors recommendation

## 2021-06-20 ENCOUNTER — Other Ambulatory Visit: Payer: Self-pay | Admitting: Internal Medicine

## 2021-06-23 ENCOUNTER — Telehealth: Payer: Self-pay | Admitting: Internal Medicine

## 2021-06-23 NOTE — Telephone Encounter (Signed)
Pt c/o medication issue:  1. Name of Medication: Didn't specify which medication   2. How are you currently taking this medication (dosage and times per day)? N/A  3. Are you having a reaction (difficulty breathing--STAT)? N/A  4. What is your medication issue? Is having dizziness in the mornings, that she believes is being caused by his medication. [place] this morning coming back from the bathroom was very sticky and sweaty when she got him up. States he has not passed out. Doesn't know if he is having symptoms now, states she is in the kitchen. Wants to speak with Rosann Auerbach and specified several times she does not want to wait all day for a callback.

## 2021-06-23 NOTE — Telephone Encounter (Signed)
CRTD Transmission reviewed, normal device function no significant episodes. BiV pacing 98% of the time, no indication of fluid accumulation

## 2021-06-23 NOTE — Telephone Encounter (Signed)
Spoke with pt and pt's wife who report increasing weakness nausea dizziness and diarrhea x 2 weeks.  Pt states he cannot get up from the bed quickly in the mornings.  Pt states this morning he missed the bed this morning after returning from the bathroom.  He did "catch himself" so he did not fall but became diaphoretic and clammy.  This was before any medications.  Pt does not have equipment to check BP or HR.  He feels he has been declining since starting Amiodarone and Mexiletine.   Requested pt send a remote transmission for review and discussed scheduling appointment with Dr Olin Pia PA.  Will also discuss with Dr Caryl Comes.  Reviewed ED precautions.  Pt and pt's wife verbalizes understanding and agrees with current plan.

## 2021-06-23 NOTE — Telephone Encounter (Signed)
Lets have him, stop mex and see if he returns to his baseline, then we will have to figure plan 3.4 for his VT :))

## 2021-06-23 NOTE — Telephone Encounter (Signed)
Spoke with pt and pt's wife and advised per Dr Caryl Comes stop Mexiletine and will see if pt returns to baseline and then will formulate a plan from there.  Pt will contact RN on Wednesday.  Pt verbalizes understanding and agrees with current plan.

## 2021-06-24 LAB — CUP PACEART REMOTE DEVICE CHECK
Battery Remaining Longevity: 36 mo
Battery Remaining Percentage: 51 %
Battery Voltage: 2.93 V
Brady Statistic AP VP Percent: 97 %
Brady Statistic AP VS Percent: 1.6 %
Brady Statistic AS VP Percent: 1 %
Brady Statistic AS VS Percent: 1 %
Brady Statistic RA Percent Paced: 99 %
Date Time Interrogation Session: 20221128085828
HighPow Impedance: 78 Ohm
HighPow Impedance: 78 Ohm
Implantable Lead Implant Date: 20130717
Implantable Lead Implant Date: 20130717
Implantable Lead Implant Date: 20130717
Implantable Lead Location: 753858
Implantable Lead Location: 753859
Implantable Lead Location: 753860
Implantable Lead Model: 4396
Implantable Lead Model: 5076
Implantable Lead Model: 6935
Implantable Pulse Generator Implant Date: 20191129
Lead Channel Impedance Value: 400 Ohm
Lead Channel Impedance Value: 510 Ohm
Lead Channel Impedance Value: 690 Ohm
Lead Channel Pacing Threshold Amplitude: 0.75 V
Lead Channel Pacing Threshold Amplitude: 0.75 V
Lead Channel Pacing Threshold Amplitude: 1.25 V
Lead Channel Pacing Threshold Pulse Width: 0.5 ms
Lead Channel Pacing Threshold Pulse Width: 0.5 ms
Lead Channel Pacing Threshold Pulse Width: 0.7 ms
Lead Channel Sensing Intrinsic Amplitude: 11.6 mV
Lead Channel Sensing Intrinsic Amplitude: 2.3 mV
Lead Channel Setting Pacing Amplitude: 2 V
Lead Channel Setting Pacing Amplitude: 2.25 V
Lead Channel Setting Pacing Amplitude: 2.5 V
Lead Channel Setting Pacing Pulse Width: 0.5 ms
Lead Channel Setting Pacing Pulse Width: 0.7 ms
Lead Channel Setting Sensing Sensitivity: 0.5 mV
Pulse Gen Serial Number: 9812589

## 2021-06-25 ENCOUNTER — Ambulatory Visit (INDEPENDENT_AMBULATORY_CARE_PROVIDER_SITE_OTHER): Payer: Medicare HMO

## 2021-06-25 DIAGNOSIS — I428 Other cardiomyopathies: Secondary | ICD-10-CM | POA: Diagnosis not present

## 2021-07-04 NOTE — Progress Notes (Signed)
Remote ICD transmission.   

## 2021-07-14 DIAGNOSIS — I429 Cardiomyopathy, unspecified: Secondary | ICD-10-CM | POA: Diagnosis not present

## 2021-07-14 DIAGNOSIS — R5383 Other fatigue: Secondary | ICD-10-CM | POA: Diagnosis not present

## 2021-07-14 DIAGNOSIS — I472 Ventricular tachycardia, unspecified: Secondary | ICD-10-CM | POA: Diagnosis not present

## 2021-07-15 ENCOUNTER — Telehealth: Payer: Self-pay | Admitting: Internal Medicine

## 2021-07-15 NOTE — Telephone Encounter (Signed)
° °  Pt c/o BP issue: STAT if pt c/o blurred vision, one-sided weakness or slurred speech  1. What are your last 5 BP readings? 98/64  2. Are you having any other symptoms (ex. Dizziness, headache, blurred vision, passed out)?   3. What is your BP issue? Pt said she saw his pcp yesterday because his BP been running low. He said he was told by pcp he needs to be seen by Dr. Caryl Comes for f/u. He made an appt with Renee on 07/24/21. He said he stopped taking his heart meds because he was getting bad side effects but now his BP been running low. He is requesting to speak with a nurse

## 2021-07-15 NOTE — Telephone Encounter (Signed)
Spoke with pt who states he saw his PCP yesterday and BP was 98/64.  He was advised to stop his Mexiletine on 06/23/21 which he did and felt better until last week.  Complaining of fatigue.  Labs were completed at PCP visit yesterday. Requested pt check daily BP and HR and keep a log until he sees Tommye Standard, [place] on 07/24/2021.  Call office if BP consistently below 100/60.  Requested pt send a remote transmission for review.  Pt verbalizes understanding and agrees with current plan.

## 2021-07-15 NOTE — Telephone Encounter (Signed)
Transmission reviewed. Presenting rhythm AP/BP @ 83. 6 NSVT episodes with no EGMs available

## 2021-07-23 ENCOUNTER — Telehealth: Payer: Self-pay

## 2021-07-23 NOTE — Progress Notes (Signed)
Cardiology Office Note Date:  07/24/2021  Patient ID:  Jerry Farley, Jerry Farley 10/28/1945, MRN [id number] PCP:  Baxter Hire, MD  Electrophysiologist:  Dr. Caryl Comes   Chief Complaint:  BP concerns, recurrent VT  History of Present Illness: Jerry Farley is a 75 y.o. male with history of CAD (PCI to 1st OM, last cath July 2020, patent stent, no obstructive CAD), NICM w/ICD, chronic CHF (systolic), HLD, PMVT/MMVT  He comes in today to be seen for Dr. Caryl Comes, last seen by him Oct 2022, discussed vision changes post ablation, as well as some b/l LE weakness, dizziness, seems there was some mention of having been told was dry eyes. Some concern of stroke, I don't see plans for w/u He had more VT was started on mexiletine   The pt called 12/20 with concerns of low BP, PMD advised him (it seems) to stop his cardiac medicines?  Perhaps just his mexiletine and f/u with Dr. Caryl Comes. Notes report improved BP and symptoms off the mexiletine Subsequently device clinic alert for VT treated with ATP  TODAY He is accompanied by his wife.  They are uncertain when his BP started to be low, though mentions his PMD for a few visits now has commented that his BP is low. After being started on the mexiletine though he very mush THEN started feeling poorly, stumbling, lightheaded and weak only after a few doses. Once her stopped he clearly then felt better.  He will intermittently feel lightheaded upon standing He has not fainted Today when they got here and he got out of the car he felt weak/lightheaded and in the elevator had I hold his wife's arm to stabilize himself Seated currently denies any symptom.  No CP, no palpitations or cardiac awareness No SOB or unusual SOB   Device information: MDT single chamber ICD implanted 2006 for PMVT/secondary prevention; upgrade to SJM CRT-D 2013 with capping of 6948 lead and addition of RA lead  History of appropriate therapy: yes  Due to escalation in  frequency of VT and ATP therapies underwent VT ablation 02/13/21, Dr. Lovena Le  History of AAD  amiodarone goes back to 2012 in his chart Appears to have remotely been on Multaq? Mexiletine started Oct 2022 with recurrent VT/ATP >> pt self stopped Dec 2022  Past Medical History:  Diagnosis Date   6948-lead    Arthritis    "maybe a little"   CAD (coronary artery disease) July 2007   CFX stent (patent Sept '07)   Cardiac defibrillator CRT-medtronic 0/01/3709   Chronic systolic congestive heart failure (HCC)    Complication of anesthesia    "problems waking up w/valium"   High cholesterol    Hypertension    Hypothyroidism    ICD (implantable cardiac defibrillator) in place 2006, 2013   Medtronic Protecta XT-DR CRT (01/2012)   NICM (nonischemic cardiomyopathy) ([place])    ejection fraction 40-45% by echo July 2012   Non-ischemic cardiomyopathy- EF 40-45% 2D 7/14 02/11/2012   Obesity (BMI 30-39.9) 08/22/2013   Ventricular tachycardia, non-sustained 2007   Amiodarone    Past Surgical History:  Procedure Laterality Date   BI-VENTRICULAR IMPLANTABLE CARDIOVERTER DEFIBRILLATOR UPGRADE N/A 02/10/2012   Procedure: BI-VENTRICULAR IMPLANTABLE CARDIOVERTER DEFIBRILLATOR UPGRADE;  Surgeon: Deboraha Sprang, MD;  Location: [facility] CATH LAB;  Service: Cardiovascular;  Laterality: N/A;   BIV ICD GENERATOR CHANGEOUT N/A 06/24/2018   Procedure: BIV ICD GENERATOR CHANGEOUT;  Surgeon: Deboraha Sprang, MD;  Location: [facility] CV LAB;  Service: Cardiovascular;  Laterality: N/A;   CARDIAC CATHETERIZATION  11/30/2001   non-ischemic cardiomyopahty with high LV end-diastolic pressure; 03% distal L main stenosis, large osital stenosis 70%, 70% Cfx stenosis (Dr. Domenic Moras)   CARDIAC CATHETERIZATION  9/07   CFX stent patent [facility])   CARDIAC DEFIBRILLATOR PLACEMENT  09/2004; 02/10/12   ICD implanted in 2006 (Dr. Rito Ehrlich); EOL generator change in 01/2012 - CRT (BiV ICD) (Dr. Caryl Comes)   [place]   01/2006   CFX    LEFT HEART CATH AND CORONARY ANGIOGRAPHY N/A 04/13/2019   Procedure: LEFT HEART CATH AND CORONARY ANGIOGRAPHY;  Surgeon: Jettie Booze, MD;  Location: [facility] CV LAB;  Service: Cardiovascular;  Laterality: N/A;   NM MYOCAR PERF EJECTION FRACTION  05/2010   dipyridamole myoview; mod perfusion defect in apical, basal inferior, mid inferior & apical inferior regions (infarct/scar); post-stress 28%, low risk scan   TONSILLECTOMY AND ADENOIDECTOMY     "when I was a child"   TRANSTHORACIC ECHOCARDIOGRAM  01/2013   EF 40-45%, LV mildly dilated, mild LVH, mod hypokinesis of inferior myocardium, grade 1 diastolic dysfunction; mild MR   V TACH ABLATION N/A 02/13/2021   Procedure: V TACH ABLATION;  Surgeon: Evans Lance, MD;  Location: [facility] CV LAB;  Service: Cardiovascular;  Laterality: N/A;    Current Outpatient Medications  Medication Sig Dispense Refill   losartan (COZAAR) 25 MG tablet Take 1 tablet (25 mg total) by mouth daily. 90 tablet 3   acetaminophen (TYLENOL) 325 MG tablet Take 2 tablets (650 mg total) by mouth every 4 (four) hours as needed for headache or mild pain.     amiodarone (PACERONE) 200 MG tablet Take 1 tablet (200 mg total) by mouth daily. 90 tablet 3   aspirin 81 MG tablet Take 81 mg by mouth daily.     carvedilol (COREG) 25 MG tablet TAKE 1 TABLET BY MOUTH TWICE DAILY WITH MEALS 180 tablet 0   clopidogrel (PLAVIX) 75 MG tablet Take 1 tablet by mouth once daily 90 tablet 3   EUTHYROX 125 MCG tablet TAKE 1 TABLET BY MOUTH ONCE DAILY BEFORE BREAKFAST 90 tablet 1   fish oil-omega-3 fatty acids 1000 MG capsule Take 1 g by mouth daily.     Magnesium Oxide 400 MG CAPS Take 400 mg by mouth daily.     Multiple Vitamin (MULTIVITAMIN) tablet Take 1 tablet by mouth daily.     oxymetazoline (AFRIN) 0.05 % nasal spray Place 1 spray into both nostrils 2 (two) times daily as needed for congestion.     rosuvastatin (CRESTOR) 20 MG tablet Take 1  tablet by mouth once daily 90 tablet 3   spironolactone (ALDACTONE) 25 MG tablet Take 1/2 (one-half) tablet by mouth once daily 45 tablet 3   No current facility-administered medications for this visit.    Allergies:   Contrast media [iodinated contrast media]   Social History:  The patient  reports that he quit smoking about 18 years ago. His smoking use included cigarettes. He has a 63.00 pack-year smoking history. He has quit using smokeless tobacco.  His smokeless tobacco use included chew. He reports that he does not drink alcohol and does not use drugs.   Family History:  The patient's family history includes Cancer in his brother and mother.  ROS:  Please see the history of present illness.    All other systems are reviewed and otherwise negative.   PHYSICAL EXAM:  VS:  BP (!) 88/50  Pulse 72    Ht 5\' 6"  (1.676 m)    Wt 181 lb (82.1 kg)    SpO2 98%    BMI 29.21 kg/m  BMI: Body mass index is 29.21 kg/m. Well nourished, well developed, in no acute distress  HEENT: normocephalic, atraumatic  Neck: no JVD, carotid bruits or masses Cardiac:   RRR; no significant murmurs, no rubs, or gallops Lungs:  CTA b/l, no wheezing, rhonchi or rales  Abd: soft, nontender MS: no deformity or atrophy Ext: no edema  Skin: warm and dry, no rash Neuro:  No gross deficits appreciated Psych: euthymic mood, full affect  ICD site is stable, no tethering or discomfort   EKG:  done today and reviewed by myself AV pacedQRS is 173ms, V1+, lead I - Similar to prior  ICD interrogation done today and reviewed by myself:  Battery and lead measurements are good 96% BP No AMS One VT seen by remote, +ATP, was successful, VT rate slow at  129bpm  02/13/21: EPS/Ablation Conclusion: Successful EP study and catheter ablation of sustained monomorphic ventricular tachycardia originating from the superior basal portion of the left ventricle near the aorto mitral continuity.  Following catheter ablation,  there was no inducible ventricular tachycardia with triple extrastimuli  9/17/202020: LHC Previously placed 1st Mrg stent (unknown type) is widely patent. Dist LM lesion is 25% stenosed. There is moderate to severe left ventricular systolic dysfunction. The left ventricular ejection fraction is 25-35% by visual estimate. LV end diastolic pressure is mildly elevated. There is no aortic valve stenosis.   It appears LVEF has dropped since last evaluation several years ago.     Nonobstructive CAD.  Patent stent in the OM.  06/27/15: TTE Study Conclusions - Left ventricle: The cavity size was normal. Wall thickness was   normal. Systolic function was mildly to moderately reduced. The   estimated ejection fraction was in the range of 40% to 45%. There   is inferior and inferoseptal hypokinesis. Doppler parameters are   consistent with abnormal left ventricular relaxation (grade 1   diastolic dysfunction). The E/e&' ratio is between 8-15,   suggesting indeterminate LV filling pressure. - Mitral valve: Mildly thickened leaflets . There was trivial   regurgitation. - Left atrium: The atrium was normal in size. - Right ventricle: The cavity size was normal. Wall thickness was   normal. Pacer wire or catheter noted in right ventricle. Systolic   function is reduced. - Right atrium: The atrium was normal in size. Pacer wire or   catheter noted in right atrium. - Atrial septum: No defect or patent foramen ovale was identified. - Tricuspid valve: There was trivial regurgitation. - Pulmonary arteries: PA peak pressure: 21 mm Hg (S). - Inferior vena cava: The vessel was normal in size. The   respirophasic diameter changes were in the normal range (= 50%),   consistent with normal central venous pressure. Impressions: - Compared to the prior study in 2014, there are no significant   changes.  01/28/06: PCI to LCx with cypher stent 01/18/06: LHC LM 30% eccentric irregularity similar to 2003 LAD  no lesions LCx 90% mid lesion RCA mid 40%, similar to 2003   Recent Labs: 08/07/2020: TSH 3.850 12/13/2020: ALT 40; Magnesium 2.3 01/31/2021: BUN 15; Creatinine, Ser 1.20; Hemoglobin 13.4; Platelets 144; Potassium 4.8; Sodium 141  No results found for requested labs within last 8760 hours.   CrCl cannot be calculated (Patient's most recent lab result is older than the maximum 21 days allowed.).  Wt Readings from Last 3 Encounters:  07/24/21 181 lb (82.1 kg)  05/26/21 190 lb 9.6 oz (86.5 kg)  04/22/21 193 lb (87.5 kg)     Other studies reviewed: Additional studies/records reviewed today include: summarized above  ASSESSMENT AND PLAN:  1. NICM w/ICD, VT     Stable device function     No programming changes made     2. CAD     No symptoms of angina     On  ASA, Plavix, statin, BB  3. chronic CHF     On BB/ACE, aldactone, Entresto      CorVue is down though exam does not support volume OL He drinks a couple sips of water with his pills, a couple more sips with dinner Does not otherwise drink water Occ cup of coffee or hot cocoa, not much else in the way of hydration/fluid intake   Will check BMET given meds, and a mag level since he is on replacement  4. Hx of PMVT 5. VT     Longstanding amiodarone     Seems clear that he was intolerant of  mexiletin     ? If vision issue is the amiodarone, a second eye exam they report being told perhaps/likely was a film on his eyes because of the amiodarone   6. Hypotension' initial BP 88/50 confirmed by myself with manual BP After drinking a couple cups of water, 90/56 Hoe BP readings look better (110s/60's) but they report that his BPs at his PMD for a few visits were "low"  His BP here today clearly to low and symptomatic coming in. Will stop his Ranexa (seems added on for his VT), can contribute to low BP and not likely adding antiarrhythmic affect At least for now, feel like we need to stop his Entresto, replace it with  Losartan  I suspect he is also a bit dry Discussed that he needs to drink more water, start with a few cups a day at least  Update his echo Labs today I don't think more amio will help, and am concerned that his eye issue could be amio? Iwill send my note to Dr. Caryl Comes for his recommendations. He had VT, was slow and asymptomatic, ATP worked   Disposition: have him back in a couple weeks  Current medicines are reviewed at length with the patient today.  The patient did not have any concerns regarding medicines.  Haywood Lasso, PA-C 07/24/2021 12:10 PM     [address] [place] [address] [phone] (office)  [phone] (fax)

## 2021-07-23 NOTE — Telephone Encounter (Signed)
[facility] alert for VT with ATP therapy Event occurred 12/27 @ 11:08 EGM shows sustained VT, rate 129 falling in to the VT-1 zone.  ATP delivered x1 slowing the rhythm to regular AP/BP  Pt has fairly recent history of VT ablation and changes in medications.  Of note, he was on Multaq until the end of November when he stopped it due to adverse effects.    Spoke with patient.  He does not recall what he was doing at time of event, thinks he was sitting on couch watching TV.  He denies any cardiac symptoms or awareness of event.    Pt is scheduled for f/u visit with RU, PA tomorrow.    Current meds include: Amiodarone 200mg  dily, Carvedilol 25mg  BID.  Pt confirmed compliance with meds as ordered.    Educated patient on DMV restrictions, he was already under due to previous events, advised 6 month clock restarts with this episode.

## 2021-07-23 NOTE — Telephone Encounter (Signed)
Spoke with pt who states he remains off of Mexiletine and continues to feel better off medication.  Pt advised will forward information to Dr Caryl Comes.  Pt will plans to keep appointment with Tommye Standard 07/24/2021.

## 2021-07-24 ENCOUNTER — Encounter: Payer: Self-pay | Admitting: Physician Assistant

## 2021-07-24 ENCOUNTER — Other Ambulatory Visit: Payer: Self-pay

## 2021-07-24 ENCOUNTER — Ambulatory Visit: Payer: Medicare HMO | Admitting: Physician Assistant

## 2021-07-24 VITALS — BP 88/50 | HR 72 | Ht 66.0 in | Wt 181.0 lb

## 2021-07-24 DIAGNOSIS — Z79899 Other long term (current) drug therapy: Secondary | ICD-10-CM | POA: Diagnosis not present

## 2021-07-24 DIAGNOSIS — I472 Ventricular tachycardia, unspecified: Secondary | ICD-10-CM

## 2021-07-24 DIAGNOSIS — I426 Alcoholic cardiomyopathy: Secondary | ICD-10-CM | POA: Diagnosis not present

## 2021-07-24 DIAGNOSIS — I5022 Chronic systolic (congestive) heart failure: Secondary | ICD-10-CM | POA: Diagnosis not present

## 2021-07-24 DIAGNOSIS — Z95810 Presence of automatic (implantable) cardiac defibrillator: Secondary | ICD-10-CM | POA: Diagnosis not present

## 2021-07-24 DIAGNOSIS — I428 Other cardiomyopathies: Secondary | ICD-10-CM

## 2021-07-24 DIAGNOSIS — I2510 Atherosclerotic heart disease of native coronary artery without angina pectoris: Secondary | ICD-10-CM

## 2021-07-24 DIAGNOSIS — I952 Hypotension due to drugs: Secondary | ICD-10-CM

## 2021-07-24 LAB — COMPREHENSIVE METABOLIC PANEL
ALT: 50 IU/L — ABNORMAL HIGH (ref 0–44)
AST: 54 IU/L — ABNORMAL HIGH (ref 0–40)
Albumin/Globulin Ratio: 1.7 (ref 1.2–2.2)
Albumin: 3.9 g/dL (ref 3.7–4.7)
Alkaline Phosphatase: 59 IU/L (ref 44–121)
BUN/Creatinine Ratio: 10 (ref 10–24)
BUN: 11 mg/dL (ref 8–27)
Bilirubin Total: 0.5 mg/dL (ref 0.0–1.2)
CO2: 26 mmol/L (ref 20–29)
Calcium: 9.3 mg/dL (ref 8.6–10.2)
Chloride: 102 mmol/L (ref 96–106)
Creatinine, Ser: 1.13 mg/dL (ref 0.76–1.27)
Globulin, Total: 2.3 g/dL (ref 1.5–4.5)
Glucose: 124 mg/dL — ABNORMAL HIGH (ref 70–99)
Potassium: 4.4 mmol/L (ref 3.5–5.2)
Sodium: 139 mmol/L (ref 134–144)
Total Protein: 6.2 g/dL (ref 6.0–8.5)
eGFR: 68 mL/min/{1.73_m2} (ref >59)

## 2021-07-24 LAB — CBC
Hematocrit: 39.2 % (ref 37.5–51.0)
Hemoglobin: 13.5 g/dL (ref 13.0–17.7)
MCH: 33.2 pg — ABNORMAL HIGH (ref 26.6–33.0)
MCHC: 34.4 g/dL (ref 31.5–35.7)
MCV: 96 fL (ref 79–97)
Platelets: 195 10*3/uL (ref 150–450)
RBC: 4.07 x10E6/uL — ABNORMAL LOW (ref 4.14–5.80)
RDW: 12.9 % (ref 11.6–15.4)
WBC: 8.6 10*3/uL (ref 3.4–10.8)

## 2021-07-24 LAB — TSH: TSH: 3.2 u[IU]/mL (ref 0.450–4.500)

## 2021-07-24 LAB — MAGNESIUM: Magnesium: 2.4 mg/dL — ABNORMAL HIGH (ref 1.6–2.3)

## 2021-07-24 MED ORDER — LOSARTAN POTASSIUM 25 MG PO TABS: 25.0000 mg | ORAL_TABLET | Freq: Every day | ORAL | 3 refills | Status: DC

## 2021-07-24 MED ORDER — AMIODARONE HCL 200 MG PO TABS: 200.0000 mg | ORAL_TABLET | Freq: Every day | ORAL | 3 refills | Status: AC

## 2021-07-24 NOTE — Patient Instructions (Addendum)
Medication Instructions:   STOP TAKING: ENTRESTO 24-26 MG    STOP TAKING:  RENEXA 500 MG   START TAKING: LOSARTAN 25 MG ONCE A DAY    *If you need a refill on your cardiac medications before your next appointment, please call your pharmacy*   Lab Work:   CMET MAG CBC AND TSH  TODAY    If you have labs (blood work) drawn today and your tests are completely normal, you will receive your results only by: MyChart Message (if you have MyChart) OR A paper copy in the mail If you have any lab test that is abnormal or we need to change your treatment, we will call you to review the results.   Testing/Procedures: Your physician has requested that you have an echocardiogram. Echocardiography is a painless test that uses sound waves to create images of your heart. It provides your doctor with information about the size and shape of your heart and how well your hearts chambers and valves are working. This procedure takes approximately one hour. There are no restrictions for this procedure.     Follow-Up: At [facility], you and your health needs are our priority.  As part of our continuing mission to provide you with exceptional heart care, we have created designated Provider Care Teams.  These Care Teams include your primary Cardiologist (physician) and Advanced Practice Providers (APPs -  Physician Assistants and Nurse Practitioners) who all work together to provide you with the care you need, when you need it.  We recommend signing up for the patient portal called "MyChart".  Sign up information is provided on this After Visit Summary.  MyChart is used to connect with patients for Virtual Visits (Telemedicine).  Patients are able to view lab/test results, encounter notes, upcoming appointments, etc.  Non-urgent messages can be sent to your provider as well.   To learn more about what you can do with MyChart, go to [email or web address].    Your next appointment:   2 week(s) ( CONTACT  [facility] FOR EP SCHEDULING ISSUES )   The format for your next appointment:   In Person  Provider:   You may see  Dr.Klein or one of the following Advanced Practice Providers on your designated Care Team:   Tommye Standard, [place] Legrand Como "Jonni Sanger" Chalmers Cater, PA-C{    Other Instructions

## 2021-08-03 NOTE — Progress Notes (Addendum)
Cardiology Office Note Date:  08/03/2021  Patient ID:  Jerry Farley, Jerry Farley 1946/06/19, MRN [id number] PCP:  Baxter Hire, MD  Electrophysiologist:  Dr. Caryl Comes   Chief Complaint:  planned follow up  History of Present Illness: Jerry Farley is a 76 y.o. male with history of CAD (PCI to 1st OM, last cath July 2020, patent stent, no obstructive CAD), NICM w/ICD, chronic CHF (systolic), HLD, PMVT/MMVT  He comes in today to be seen for Dr. Caryl Comes, last seen by him Oct 2022, discussed vision changes post ablation, as well as some b/l LE weakness, dizziness, seems there was some mention of having been told was dry eyes. Some concern of stroke, I don't see plans for w/u He had more VT was started on mexiletine   The pt called 12/20 with concerns of low BP, PMD advised him (it seems) to stop his cardiac medicines?  Perhaps just his mexiletine and f/u with Dr. Caryl Comes. Notes report improved BP and symptoms off the mexiletine Subsequently device clinic alert for VT treated with ATP  I saw him 07/24/21 He is accompanied by his wife.  They are uncertain when his BP started to be low, though mentions his PMD for a few visits now has commented that his BP is low. After being started on the mexiletine though he very mush THEN started feeling poorly, stumbling, lightheaded and weak only after a few doses. Once her stopped he clearly then felt better. He will intermittently feel lightheaded upon standing He has not fainted Today when they got here and he got out of the car he felt weak/lightheaded and in the elevator had I hold his wife's arm to stabilize himself Seated currently denies any symptom. No CP, no palpitations or cardiac awareness No SOB or unusual SOB  He has very poor oral hydration and encouraged to drink more water, Ranexa stopped, Entresto changed to losartan as well with symptomatic hypotension with, Corvue was down, but suspected he was actually dry Planned to update his echo,  labs. Reached out to Dr. Caryl Comes for his recommendations  Labs looked OK, LFTs minimally elevated, TSH ok, not anemic, lytes/creat OK Echo is pending  D/w Dr. Caryl Comes, corneal deposits felt/understood to be benign, with plans to reach out to colleagues further.  TODAY He is accompanied by his wife. He feels pretty awful Since our last visit he has seen a walk-in clnic and his PMD His appetite is poor, not eating, in the last week or so nauseous, some retching, no vomiting Steady weight loss His PMD has ordered CT scan NO CP,palpitations or cardiac awareness No SOB No syncope Feels lightheaded sometimes, mostly with standing up No shocks  08/04/21: [facility], walk-in 94/60, 86bpm, 94%, 175lbs Resp panel negative K+ 4.4 BUN/Creat 9/1.0 AST 52 ALT 56 WBC 7.8 H/H 13/40 Plts 186  08/06/21, PMD office 96/62, 76bpm, 96%, 177lbs Planned for CT abdomen and plevis  Device information: MDT single chamber ICD implanted 2006 for PMVT/secondary prevention; upgrade to SJM CRT-D 2013 with capping of 6948 lead and addition of RA lead  History of appropriate therapy: yes  Due to escalation in frequency of VT and ATP therapies underwent VT ablation 02/13/21, Dr. Lovena Le  History of AAD  amiodarone goes back to 2012 in his chart Appears to have remotely been on Multaq? Mexiletine started Oct 2022 with recurrent VT/ATP >> pt self stopped Dec 2022  Past Medical History:  Diagnosis Date   6948-lead    Arthritis    "  maybe a little"   CAD (coronary artery disease) July 2007   CFX stent (patent Sept '07)   Cardiac defibrillator CRT-medtronic 08/31/5807   Chronic systolic congestive heart failure (HCC)    Complication of anesthesia    "problems waking up w/valium"   High cholesterol    Hypertension    Hypothyroidism    ICD (implantable cardiac defibrillator) in place 2006, 2013   Medtronic Protecta XT-DR CRT (01/2012)   NICM (nonischemic cardiomyopathy) ([place])    ejection fraction  40-45% by echo July 2012   Non-ischemic cardiomyopathy- EF 40-45% 2D 7/14 02/11/2012   Obesity (BMI 30-39.9) 08/22/2013   Ventricular tachycardia, non-sustained 2007   Amiodarone    Past Surgical History:  Procedure Laterality Date   BI-VENTRICULAR IMPLANTABLE CARDIOVERTER DEFIBRILLATOR UPGRADE N/A 02/10/2012   Procedure: BI-VENTRICULAR IMPLANTABLE CARDIOVERTER DEFIBRILLATOR UPGRADE;  Surgeon: Deboraha Sprang, MD;  Location: [facility] CATH LAB;  Service: Cardiovascular;  Laterality: N/A;   BIV ICD GENERATOR CHANGEOUT N/A 06/24/2018   Procedure: BIV ICD GENERATOR CHANGEOUT;  Surgeon: Deboraha Sprang, MD;  Location: [place] CV LAB;  Service: Cardiovascular;  Laterality: N/A;   CARDIAC CATHETERIZATION  11/30/2001   non-ischemic cardiomyopahty with high LV end-diastolic pressure; 98% distal L main stenosis, large osital stenosis 70%, 70% Cfx stenosis (Dr. Domenic Moras)   CARDIAC CATHETERIZATION  9/07   CFX stent patent [facility])   CARDIAC DEFIBRILLATOR PLACEMENT  09/2004; 02/10/12   ICD implanted in 2006 (Dr. Rito Ehrlich); EOL generator change in 01/2012 - CRT (BiV ICD) (Dr. Caryl Comes)   [place]   01/2006   CFX    LEFT HEART CATH AND CORONARY ANGIOGRAPHY N/A 04/13/2019   Procedure: LEFT HEART CATH AND CORONARY ANGIOGRAPHY;  Surgeon: Jettie Booze, MD;  Location: [place] CV LAB;  Service: Cardiovascular;  Laterality: N/A;   NM MYOCAR PERF EJECTION FRACTION  05/2010   dipyridamole myoview; mod perfusion defect in apical, basal inferior, mid inferior & apical inferior regions (infarct/scar); post-stress 28%, low risk scan   TONSILLECTOMY AND ADENOIDECTOMY     "when I was a child"   TRANSTHORACIC ECHOCARDIOGRAM  01/2013   EF 40-45%, LV mildly dilated, mild LVH, mod hypokinesis of inferior myocardium, grade 1 diastolic dysfunction; mild MR   V TACH ABLATION N/A 02/13/2021   Procedure: V TACH ABLATION;  Surgeon: Evans Lance, MD;  Location: [place] CV LAB;  Service:  Cardiovascular;  Laterality: N/A;    Current Outpatient Medications  Medication Sig Dispense Refill   acetaminophen (TYLENOL) 325 MG tablet Take 2 tablets (650 mg total) by mouth every 4 (four) hours as needed for headache or mild pain.     amiodarone (PACERONE) 200 MG tablet Take 1 tablet (200 mg total) by mouth daily. 90 tablet 3   aspirin 81 MG tablet Take 81 mg by mouth daily.     carvedilol (COREG) 25 MG tablet TAKE 1 TABLET BY MOUTH TWICE DAILY WITH MEALS 180 tablet 0   clopidogrel (PLAVIX) 75 MG tablet Take 1 tablet by mouth once daily 90 tablet 3   EUTHYROX 125 MCG tablet TAKE 1 TABLET BY MOUTH ONCE DAILY BEFORE BREAKFAST 90 tablet 1   fish oil-omega-3 fatty acids 1000 MG capsule Take 1 g by mouth daily.     losartan (COZAAR) 25 MG tablet Take 1 tablet (25 mg total) by mouth daily. 90 tablet 3   Magnesium Oxide 400 MG CAPS Take 400 mg by mouth daily.     Multiple Vitamin (MULTIVITAMIN) tablet  Take 1 tablet by mouth daily.     oxymetazoline (AFRIN) 0.05 % nasal spray Place 1 spray into both nostrils 2 (two) times daily as needed for congestion.     rosuvastatin (CRESTOR) 20 MG tablet Take 1 tablet by mouth once daily 90 tablet 3   spironolactone (ALDACTONE) 25 MG tablet Take 1/2 (one-half) tablet by mouth once daily 45 tablet 3   No current facility-administered medications for this visit.    Allergies:   Contrast media [iodinated contrast media]   Social History:  The patient  reports that he quit smoking about 18 years ago. His smoking use included cigarettes. He has a 63.00 pack-year smoking history. He has quit using smokeless tobacco.  His smokeless tobacco use included chew. He reports that he does not drink alcohol and does not use drugs.   Family History:  The patient's family history includes Cancer in his brother and mother.  ROS:  Please see the history of present illness.    All other systems are reviewed and otherwise negative.   PHYSICAL EXAM:  VS:  There were no  vitals taken for this visit. BMI: There is no height or weight on file to calculate BMI. Well nourished, well developed, in no acute distress  HEENT: normocephalic, atraumatic  Neck: no JVD, carotid bruits or masses Cardiac:   RRR; no significant murmurs, no rubs, or gallops Lungs:  CTA b/l, no wheezing, rhonchi or rales  Abd: soft, nontender MS: no deformity or atrophy Ext: no edema  Skin: warm and dry, no rash Neuro:  No gross deficits appreciated Psych: euthymic mood, full affect  ICD site is stable, no tethering or discomfort   EKG: done today and reviewed by myself AV paced 79bpm, no changes  ICD interrogation done today and reviewed by myself:  Battery and lead measurements are stable No VT or therapies 32 NSVT episodes, no EGMs Longest 28 seconds, most are 6-8 seconds D/w SJM rep, programmed to collect NSVT EGMs     02/13/21: EPS/Ablation Conclusion: Successful EP study and catheter ablation of sustained monomorphic ventricular tachycardia originating from the superior basal portion of the left ventricle near the aorto mitral continuity.  Following catheter ablation, there was no inducible ventricular tachycardia with triple extrastimuli  9/17/202020: LHC Previously placed 1st Mrg stent (unknown type) is widely patent. Dist LM lesion is 25% stenosed. There is moderate to severe left ventricular systolic dysfunction. The left ventricular ejection fraction is 25-35% by visual estimate. LV end diastolic pressure is mildly elevated. There is no aortic valve stenosis.   It appears LVEF has dropped since last evaluation several years ago.     Nonobstructive CAD.  Patent stent in the OM.  06/27/15: TTE Study Conclusions - Left ventricle: The cavity size was normal. Wall thickness was   normal. Systolic function was mildly to moderately reduced. The   estimated ejection fraction was in the range of 40% to 45%. There   is inferior and inferoseptal hypokinesis. Doppler  parameters are   consistent with abnormal left ventricular relaxation (grade 1   diastolic dysfunction). The E/e&' ratio is between 8-15,   suggesting indeterminate LV filling pressure. - Mitral valve: Mildly thickened leaflets . There was trivial   regurgitation. - Left atrium: The atrium was normal in size. - Right ventricle: The cavity size was normal. Wall thickness was   normal. Pacer wire or catheter noted in right ventricle. Systolic   function is reduced. - Right atrium: The atrium was normal in size.  Pacer wire or   catheter noted in right atrium. - Atrial septum: No defect or patent foramen ovale was identified. - Tricuspid valve: There was trivial regurgitation. - Pulmonary arteries: PA peak pressure: 21 mm Hg (S). - Inferior vena cava: The vessel was normal in size. The   respirophasic diameter changes were in the normal range (= 50%),   consistent with normal central venous pressure. Impressions: - Compared to the prior study in 2014, there are no significant   changes.  01/28/06: PCI to LCx with cypher stent 01/18/06: LHC LM 30% eccentric irregularity similar to 2003 LAD no lesions LCx 90% mid lesion RCA mid 40%, similar to 2003   Recent Labs: 07/24/2021: ALT 50; BUN 11; Creatinine, Ser 1.13; Hemoglobin 13.5; Magnesium 2.4; Platelets 195; Potassium 4.4; Sodium 139; TSH 3.200  No results found for requested labs within last 8760 hours.   Estimated Creatinine Clearance: 56.8 mL/min (by C-G formula based on SCr of 1.13 mg/dL).   Wt Readings from Last 3 Encounters:  07/24/21 181 lb (82.1 kg)  05/26/21 190 lb 9.6 oz (86.5 kg)  04/22/21 193 lb (87.5 kg)     Other studies reviewed: Additional studies/records reviewed today include: summarized above  ASSESSMENT AND PLAN:  1. ICD      Stable device function      No programming changes made     2. CAD     No symptoms of angina     On  ASA, Plavix, statin, BB  3. NICM, chronic CHF     On BB/ARB, aldactone       BP remains low often, manually by myself today is 88/52, 90/52, some home numbers are better, clinic pressures recently also better  4. Hx of PMVT 5. VT     Longstanding amiodarone     Seems clear that he was intolerant of  mexiletine      Amiodarone not felt to contribute to his vison loss Intolerant of mexiletine Ranexa stopped at his last visit (unclear that is was helping and perhaps contributing to his hypotension)  6. Hypotension Persists but maybe a little better Stop losartan for now  7. Marked malaise, poor appetite, new nausea     Unintentional weight loss     14lbs since October     This is worrisome  MILD LFT elevation CHRONICALLY F/u with his PMD, CT findings  He has an echo scheduled  as well He has an already scheduled visit with Dr. Caryl Comes in a month  ?increase amiodarone to BID with multiple NSVT episodes and a treated episode in Dec    Disposition: I will discuss with Dr. Caryl Comes, though for now, keep his visit as scheduled next month   ADDEND:08/08/21 Reviewed the case with Dr. Caryl Comes last evening Brief d/w GI colleague given chronicity of amio  not likely (but coulde be) the etiology of his GI symptoms/anorexia, rec continue w/u with attending for this. Keep amio the same Reduce coreg to 12.5mg  BID to allow better BP, with possibility GI symptoms hypoperfusion 2/2 hypotension. ? Cardiac cachexia ?GI ?malignancy Keep f/u as planned  Current medicines are reviewed at length with the patient today.  The patient did not have any concerns regarding medicines.  Haywood Lasso, PA-C 08/03/2021 3:07 PM     [address] [place] [address] [phone] (office)  [phone] (fax)

## 2021-08-04 ENCOUNTER — Other Ambulatory Visit
Admission: RE | Admit: 2021-08-04 | Discharge: 2021-08-04 | Disposition: A | Discharge status: Home / Self Care | Payer: Medicare HMO | Source: Ambulatory Visit | Attending: Student | Admitting: Student

## 2021-08-04 DIAGNOSIS — R0789 Other chest pain: Secondary | ICD-10-CM | POA: Insufficient documentation

## 2021-08-04 DIAGNOSIS — R110 Nausea: Secondary | ICD-10-CM | POA: Diagnosis not present

## 2021-08-04 DIAGNOSIS — M5489 Other dorsalgia: Secondary | ICD-10-CM | POA: Diagnosis not present

## 2021-08-04 DIAGNOSIS — R5383 Other fatigue: Secondary | ICD-10-CM | POA: Diagnosis not present

## 2021-08-04 DIAGNOSIS — R079 Chest pain, unspecified: Secondary | ICD-10-CM | POA: Diagnosis not present

## 2021-08-04 DIAGNOSIS — I429 Cardiomyopathy, unspecified: Secondary | ICD-10-CM | POA: Diagnosis not present

## 2021-08-04 DIAGNOSIS — Z8679 Personal history of other diseases of the circulatory system: Secondary | ICD-10-CM | POA: Diagnosis not present

## 2021-08-04 DIAGNOSIS — R634 Abnormal weight loss: Secondary | ICD-10-CM | POA: Diagnosis not present

## 2021-08-04 DIAGNOSIS — M549 Dorsalgia, unspecified: Secondary | ICD-10-CM | POA: Insufficient documentation

## 2021-08-04 DIAGNOSIS — E034 Atrophy of thyroid (acquired): Secondary | ICD-10-CM | POA: Diagnosis not present

## 2021-08-04 DIAGNOSIS — Z03818 Encounter for observation for suspected exposure to other biological agents ruled out: Secondary | ICD-10-CM | POA: Diagnosis not present

## 2021-08-04 LAB — D-DIMER, QUANTITATIVE: D-Dimer, Quant: 1.14 ug/mL-FEU — ABNORMAL HIGH (ref 0.00–0.50)

## 2021-08-04 LAB — TROPONIN I (HIGH SENSITIVITY): Troponin I (High Sensitivity): 15 ng/L (ref <18)

## 2021-08-06 DIAGNOSIS — R634 Abnormal weight loss: Secondary | ICD-10-CM | POA: Diagnosis not present

## 2021-08-06 DIAGNOSIS — I429 Cardiomyopathy, unspecified: Secondary | ICD-10-CM | POA: Diagnosis not present

## 2021-08-07 ENCOUNTER — Encounter: Payer: Self-pay | Admitting: Physician Assistant

## 2021-08-07 ENCOUNTER — Ambulatory Visit: Payer: Medicare HMO | Admitting: Physician Assistant

## 2021-08-07 ENCOUNTER — Other Ambulatory Visit: Payer: Self-pay | Admitting: Internal Medicine

## 2021-08-07 ENCOUNTER — Other Ambulatory Visit: Payer: Self-pay

## 2021-08-07 VITALS — BP 82/50 | HR 9 | Ht 67.0 in | Wt 176.0 lb

## 2021-08-07 DIAGNOSIS — I472 Ventricular tachycardia, unspecified: Secondary | ICD-10-CM

## 2021-08-07 DIAGNOSIS — I5022 Chronic systolic (congestive) heart failure: Secondary | ICD-10-CM

## 2021-08-07 DIAGNOSIS — I2510 Atherosclerotic heart disease of native coronary artery without angina pectoris: Secondary | ICD-10-CM

## 2021-08-07 DIAGNOSIS — Z95810 Presence of automatic (implantable) cardiac defibrillator: Secondary | ICD-10-CM | POA: Diagnosis not present

## 2021-08-07 DIAGNOSIS — I428 Other cardiomyopathies: Secondary | ICD-10-CM

## 2021-08-07 DIAGNOSIS — R634 Abnormal weight loss: Secondary | ICD-10-CM

## 2021-08-07 LAB — CUP PACEART INCLINIC DEVICE CHECK
Battery Remaining Longevity: 31 mo
Brady Statistic RA Percent Paced: 92 %
Brady Statistic RV Percent Paced: 89 %
Date Time Interrogation Session: 20230112191354
HighPow Impedance: 76.5 Ohm
Implantable Lead Implant Date: 20130717
Implantable Lead Implant Date: 20130717
Implantable Lead Implant Date: 20130717
Implantable Lead Location: 753858
Implantable Lead Location: 753859
Implantable Lead Location: 753860
Implantable Lead Model: 4396
Implantable Lead Model: 5076
Implantable Lead Model: 6935
Implantable Pulse Generator Implant Date: 20191129
Lead Channel Impedance Value: 375 Ohm
Lead Channel Impedance Value: 525 Ohm
Lead Channel Impedance Value: 750 Ohm
Lead Channel Pacing Threshold Amplitude: 0.75 V
Lead Channel Pacing Threshold Amplitude: 0.75 V
Lead Channel Pacing Threshold Amplitude: 0.75 V
Lead Channel Pacing Threshold Amplitude: 0.75 V
Lead Channel Pacing Threshold Amplitude: 1.25 V
Lead Channel Pacing Threshold Amplitude: 1.25 V
Lead Channel Pacing Threshold Pulse Width: 0.5 ms
Lead Channel Pacing Threshold Pulse Width: 0.5 ms
Lead Channel Pacing Threshold Pulse Width: 0.5 ms
Lead Channel Pacing Threshold Pulse Width: 0.5 ms
Lead Channel Pacing Threshold Pulse Width: 0.7 ms
Lead Channel Pacing Threshold Pulse Width: 0.7 ms
Lead Channel Sensing Intrinsic Amplitude: 1.9 mV
Lead Channel Sensing Intrinsic Amplitude: 12 mV
Lead Channel Setting Pacing Amplitude: 2 V
Lead Channel Setting Pacing Amplitude: 2.5 V
Lead Channel Setting Pacing Amplitude: 2.75 V
Lead Channel Setting Pacing Pulse Width: 0.5 ms
Lead Channel Setting Pacing Pulse Width: 0.7 ms
Lead Channel Setting Sensing Sensitivity: 0.5 mV
Pulse Gen Serial Number: 9812589

## 2021-08-07 NOTE — Patient Instructions (Addendum)
Medication Instructions:    STOP TAKING LOSARTAN 25 MG   *If you need a refill on your cardiac medications before your next appointment, please call your pharmacy*   Lab Work: NONE ORDERED  TODAY   If you have labs (blood work) drawn today and your tests are completely normal, you will receive your results only by: [place] (if you have MyChart) OR A paper copy in the mail If you have any lab test that is abnormal or we need to change your treatment, we will call you to review the results.   Testing/Procedures: NONE ORDERED  TODAY     Follow-Up: At [facility], you and your health needs are our priority.  As part of our continuing mission to provide you with exceptional heart care, we have created designated Provider Care Teams.  These Care Teams include your primary Cardiologist (physician) and Advanced Practice Providers (APPs -  Physician Assistants and Nurse Practitioners) who all work together to provide you with the care you need, when you need it.  We recommend signing up for the patient portal called "MyChart".  Sign up information is provided on this After Visit Summary.  MyChart is used to connect with patients for Virtual Visits (Telemedicine).  Patients are able to view lab/test results, encounter notes, upcoming appointments, etc.  Non-urgent messages can be sent to your provider as well.   To learn more about what you can do with MyChart, go to [email or web address].    Your next appointment:  AS SCHEDULED   The format for your next appointment:     Other Instructions

## 2021-08-08 ENCOUNTER — Other Ambulatory Visit: Payer: Self-pay | Admitting: *Deleted

## 2021-08-08 ENCOUNTER — Telehealth: Payer: Self-pay | Admitting: *Deleted

## 2021-08-08 MED ORDER — CARVEDILOL 12.5 MG PO TABS: 12.5000 mg | ORAL_TABLET | Freq: Two times a day (BID) | ORAL | 1 refills | Status: DC

## 2021-08-08 NOTE — Telephone Encounter (Signed)
Spoke with patient wife about medication changes of Carvedilol 12.5 mg twice a day from Carvedilol 25 mg twice a day. Wife wrote down information and will make sure patient is taking as told.

## 2021-08-08 NOTE — Telephone Encounter (Signed)
-----   Message from [facility], [place] sent at 08/08/2021  8:13 AM EST ----- Please let the patient know I spoke with Dr. Caryl Comes, he would also like [place] to reduce his carvedilol to 12.5mg  BID, keep follow up with him as planned. Continue GI w/u (CT scan) with his PMD.

## 2021-08-11 ENCOUNTER — Other Ambulatory Visit: Payer: Self-pay

## 2021-08-11 ENCOUNTER — Ambulatory Visit (HOSPITAL_COMMUNITY): Payer: Medicare HMO | Attending: Cardiology

## 2021-08-11 DIAGNOSIS — I472 Ventricular tachycardia, unspecified: Secondary | ICD-10-CM | POA: Diagnosis not present

## 2021-08-11 DIAGNOSIS — I428 Other cardiomyopathies: Secondary | ICD-10-CM | POA: Diagnosis not present

## 2021-08-11 DIAGNOSIS — I509 Heart failure, unspecified: Secondary | ICD-10-CM | POA: Insufficient documentation

## 2021-08-11 DIAGNOSIS — I2510 Atherosclerotic heart disease of native coronary artery without angina pectoris: Secondary | ICD-10-CM | POA: Diagnosis not present

## 2021-08-11 DIAGNOSIS — Z95810 Presence of automatic (implantable) cardiac defibrillator: Secondary | ICD-10-CM | POA: Diagnosis not present

## 2021-08-11 DIAGNOSIS — I5022 Chronic systolic (congestive) heart failure: Secondary | ICD-10-CM

## 2021-08-11 DIAGNOSIS — E785 Hyperlipidemia, unspecified: Secondary | ICD-10-CM | POA: Diagnosis not present

## 2021-08-11 DIAGNOSIS — I351 Nonrheumatic aortic (valve) insufficiency: Secondary | ICD-10-CM | POA: Diagnosis not present

## 2021-08-11 LAB — ECHOCARDIOGRAM COMPLETE: S' Lateral: 4.5 cm

## 2021-08-13 ENCOUNTER — Telehealth: Payer: Self-pay | Admitting: Physician Assistant

## 2021-08-13 NOTE — Telephone Encounter (Signed)
In d/w Device RN who reached out to the patient yesterday to schedule device optimization visit, his wife reported they preferred to not have another visit and that he was only eating Ensure now, with ongoing nausea/anorexia. I called to check on the patient given this report Spoke to his wife The patient next to her, he had eaten 1/2 waffel for breakfast though remained weak and tired, continued to lose weight. Neither she of he felt like he was retaining fluid, he denied feeling bloated. She took his BP, 102/60 with a HR 69  I discussed that when I saw him, I did not find any overt exam findings of volume OL, though CHF can cause anorexia and nausea.  His CT scan was moved to be done at [facility] but same date (1/26).  Given his progressive anorexia I urged them to reach back out to the PMD for further recommendations, and if unable, I thought it is reasonable to be seen more urgently at the hospital. His wife was reluctant about that and would try to get in touch with the PMD office.  I recommended the hospital if not improved or unable to see/talk with his PMD.  Tommye Standard, PA-C

## 2021-08-14 ENCOUNTER — Emergency Department
Admission: EM | Admit: 2021-08-14 | Discharge: 2021-08-15 | Disposition: A | Discharge status: Home / Self Care | Payer: Medicare HMO | Attending: Emergency Medicine | Admitting: Emergency Medicine

## 2021-08-14 ENCOUNTER — Encounter: Payer: Self-pay | Admitting: Emergency Medicine

## 2021-08-14 ENCOUNTER — Other Ambulatory Visit: Payer: Self-pay

## 2021-08-14 DIAGNOSIS — I2510 Atherosclerotic heart disease of native coronary artery without angina pectoris: Secondary | ICD-10-CM | POA: Insufficient documentation

## 2021-08-14 DIAGNOSIS — N281 Cyst of kidney, acquired: Secondary | ICD-10-CM | POA: Diagnosis not present

## 2021-08-14 DIAGNOSIS — M47814 Spondylosis without myelopathy or radiculopathy, thoracic region: Secondary | ICD-10-CM | POA: Diagnosis not present

## 2021-08-14 DIAGNOSIS — I509 Heart failure, unspecified: Secondary | ICD-10-CM | POA: Diagnosis not present

## 2021-08-14 DIAGNOSIS — R112 Nausea with vomiting, unspecified: Secondary | ICD-10-CM | POA: Insufficient documentation

## 2021-08-14 DIAGNOSIS — Z950 Presence of cardiac pacemaker: Secondary | ICD-10-CM | POA: Insufficient documentation

## 2021-08-14 DIAGNOSIS — R9431 Abnormal electrocardiogram [ECG] [EKG]: Secondary | ICD-10-CM | POA: Diagnosis not present

## 2021-08-14 DIAGNOSIS — R109 Unspecified abdominal pain: Secondary | ICD-10-CM | POA: Insufficient documentation

## 2021-08-14 DIAGNOSIS — M549 Dorsalgia, unspecified: Secondary | ICD-10-CM | POA: Diagnosis not present

## 2021-08-14 DIAGNOSIS — R110 Nausea: Secondary | ICD-10-CM | POA: Diagnosis not present

## 2021-08-14 DIAGNOSIS — R634 Abnormal weight loss: Secondary | ICD-10-CM | POA: Diagnosis not present

## 2021-08-14 DIAGNOSIS — K5790 Diverticulosis of intestine, part unspecified, without perforation or abscess without bleeding: Secondary | ICD-10-CM | POA: Diagnosis not present

## 2021-08-14 DIAGNOSIS — K8020 Calculus of gallbladder without cholecystitis without obstruction: Secondary | ICD-10-CM | POA: Diagnosis not present

## 2021-08-14 DIAGNOSIS — J439 Emphysema, unspecified: Secondary | ICD-10-CM | POA: Diagnosis not present

## 2021-08-14 LAB — COMPREHENSIVE METABOLIC PANEL
ALT: 72 U/L — ABNORMAL HIGH (ref 0–44)
AST: 63 U/L — ABNORMAL HIGH (ref 15–41)
Albumin: 3.5 g/dL (ref 3.5–5.0)
Alkaline Phosphatase: 43 U/L (ref 38–126)
Anion gap: 9 (ref 5–15)
BUN: 11 mg/dL (ref 8–23)
CO2: 27 mmol/L (ref 22–32)
Calcium: 9.1 mg/dL (ref 8.9–10.3)
Chloride: 100 mmol/L (ref 98–111)
Creatinine, Ser: 1.1 mg/dL (ref 0.61–1.24)
GFR, Estimated: >60 mL/min (ref >60)
Glucose, Bld: 125 mg/dL — ABNORMAL HIGH (ref 70–99)
Potassium: 4.8 mmol/L (ref 3.5–5.1)
Sodium: 136 mmol/L (ref 135–145)
Total Bilirubin: 0.7 mg/dL (ref 0.3–1.2)
Total Protein: 6.3 g/dL — ABNORMAL LOW (ref 6.5–8.1)

## 2021-08-14 LAB — CBC
HCT: 41.2 % (ref 39.0–52.0)
Hemoglobin: 13.8 g/dL (ref 13.0–17.0)
MCH: 32.1 pg (ref 26.0–34.0)
MCHC: 33.5 g/dL (ref 30.0–36.0)
MCV: 95.8 fL (ref 80.0–100.0)
Platelets: 191 10*3/uL (ref 150–400)
RBC: 4.3 MIL/uL (ref 4.22–5.81)
RDW: 13.5 % (ref 11.5–15.5)
WBC: 9.5 10*3/uL (ref 4.0–10.5)
nRBC: 0 % (ref 0.0–0.2)

## 2021-08-14 LAB — LIPASE, BLOOD: Lipase: 28 U/L (ref 11–51)

## 2021-08-14 LAB — CBG MONITORING, ED: Glucose-Capillary: 133 mg/dL — ABNORMAL HIGH (ref 70–99)

## 2021-08-14 LAB — TROPONIN I (HIGH SENSITIVITY)
Troponin I (High Sensitivity): 16 ng/L (ref <18)
Troponin I (High Sensitivity): 16 ng/L (ref <18)

## 2021-08-14 MED ORDER — HYDROCORTISONE SOD SUC (PF) 250 MG IJ SOLR
200.0000 mg | Freq: Once | INTRAMUSCULAR | Status: AC
  Administered 2021-08-14: 200 mg via INTRAVENOUS
  Filled 2021-08-14: qty 200

## 2021-08-14 MED ORDER — DIPHENHYDRAMINE HCL 50 MG/ML IJ SOLN
50.0000 mg | Freq: Once | INTRAMUSCULAR | Status: AC
  Administered 2021-08-15: 50 mg via INTRAVENOUS
  Filled 2021-08-14: qty 1

## 2021-08-14 MED ORDER — DIPHENHYDRAMINE HCL 25 MG PO CAPS: 50.0000 mg | ORAL_CAPSULE | Freq: Once | ORAL | Status: AC

## 2021-08-14 NOTE — ED Notes (Signed)
Lab called to clarify if labs were sent earlier. Lab states has labs from 1400 and will process now.

## 2021-08-14 NOTE — ED Provider Notes (Signed)
[facility] Provider Note    Event Date/Time   First MD Initiated Contact with Patient 08/14/21 2012     (approximate)  History   Chief Complaint: Abdominal pain and weight loss HPI  Jerry Farley is a 76 y.o. male with a past medical history of CAD, pacemaker, CHF, presents emergency department for abdominal pain and weight loss.  According to the patient for the past 3 months or so he has been experiencing abdominal pain with nausea and dry heaves anytime he attempts to eat.  States he has been experiencing some pain in his back as well and having trouble sleeping.  States he has been following up with his doctor and they have scheduled for an abdominal CT on 26 January but wife states the patient symptoms seem to be worsening so he came to the emergency department for more expedited evaluation.  Patient also states over the past 3 months he has lost approximately 40 pounds due to not eating because of the nausea and dry heaving every time he eats.  He describes fairly diffuse abdominal cramping but more so around his umbilicus.  Denies any focal pain in the right upper quadrant.  Patient denies any fever or cough.  No dysuria.  No cancer history.  Patient quit smoking in 2005.  Physical Exam   Triage Vital Signs: ED Triage Vitals [08/14/21 1412]  Enc Vitals Group     BP (!) 92/53     Pulse Rate 72     Resp 17     Temp 97.7 F (36.5 C)     Temp Source Oral     SpO2 100 %     Weight 167 lb (75.8 kg)     Height 5\' 7"  (1.702 m)     Head Circumference      Peak Flow      Pain Score 0     Pain Loc      Pain Edu?      Excl. in [place]?     Most recent vital signs: Vitals:   08/14/21 2100 08/14/21 2130  BP: 113/66 116/68  Pulse: 70 74  Resp: 17 19  Temp:    SpO2: 99% 100%    General: Awake, no distress.  CV:  Good peripheral perfusion.  Regular rate and rhythm  Resp:  Normal effort.  Equal breath sounds bilaterally.  Abd:  No distention.  Soft,  slight periumbilical tenderness to palpation otherwise benign abdomen without rebound guarding or distention. Other:     ED Results / Procedures / Treatments   EKG  EKG read and interpreted by myself shows an AV dual paced rhythm at 72 bpm with a widened QRS, no concerning ST changes occasional PVC.  Largely normal intervals.  RADIOLOGY  CT scan of the chest/abdomen/pelvis pending   MEDICATIONS ORDERED IN ED: Medications  hydrocortisone sodium succinate (SOLU-CORTEF) injection 200 mg (has no administration in time range)  diphenhydrAMINE (BENADRYL) capsule 50 mg (has no administration in time range)    Or  diphenhydrAMINE (BENADRYL) injection 50 mg (has no administration in time range)     IMPRESSION / MDM / ASSESSMENT AND PLAN / ED COURSE  I reviewed the triage vital signs and the nursing notes.  Patient presents to the emergency department for several months of abdominal discomfort that is intermittent.  States frequent episodes of nausea and vomiting after he attempts to eat as well as significant weight loss.  Differential is quite broad but would include  gastritis, gastroenteritis, biliary pathology, gastric obstruction, oncologic cause, metabolic or electrolyte abnormality, dehydration, ACS.  Reassuringly patient's labs are largely within normal limits, normal white blood cell count, negative troponin.  Patient's LFTs are slightly elevated, but no right upper quadrant tenderness on my examination.  Lipase is negative.  Given the patient's abdominal pain and significant weight loss we will obtain CT imaging the abdomen in the emergency department.  Patient has an IV contrast allergy unfortunately so we will do a 4-hour hydrocortisone prep.  I have also added on a CT scan of the chest to rule out oncologic cause for the patient's significant weight loss.  Patient and wife agreeable to plan of care.  Patient care signed out to oncoming provider, CT pending.  FINAL CLINICAL  IMPRESSION(S) / ED DIAGNOSES   Abdominal pain Weight loss Nausea   Note:  This document was prepared using Dragon voice recognition software and may include unintentional dictation errors.   Harvest Dark, MD 08/14/21 2152

## 2021-08-14 NOTE — ED Notes (Signed)
This RN attempted lab draw after NT unsuccessful. Pt and wife speaking very hateful. Pt refused labs. Unhappy with wait time. Apologized and explained wait time.   No labs drawn at this time

## 2021-08-14 NOTE — ED Triage Notes (Addendum)
Pt in via POV, states, "The same thing that's been going on for 2-3 months."  Reports ongoing abdominal pain w/ nausea and dry heaves, denies diarrhea.  Also complains of significant weight loss, difficulty sleeping, back pain, trouble with vision.  Per wife, they had an order to do an outpatient abdominal CT scan on 1/26.  Ambulatory to triage, vitals WDL, NAD noted at this time.

## 2021-08-15 ENCOUNTER — Emergency Department: Payer: Medicare HMO

## 2021-08-15 DIAGNOSIS — R634 Abnormal weight loss: Secondary | ICD-10-CM | POA: Diagnosis not present

## 2021-08-15 DIAGNOSIS — K8020 Calculus of gallbladder without cholecystitis without obstruction: Secondary | ICD-10-CM | POA: Diagnosis not present

## 2021-08-15 DIAGNOSIS — K5790 Diverticulosis of intestine, part unspecified, without perforation or abscess without bleeding: Secondary | ICD-10-CM | POA: Diagnosis not present

## 2021-08-15 DIAGNOSIS — M47814 Spondylosis without myelopathy or radiculopathy, thoracic region: Secondary | ICD-10-CM | POA: Diagnosis not present

## 2021-08-15 DIAGNOSIS — I2510 Atherosclerotic heart disease of native coronary artery without angina pectoris: Secondary | ICD-10-CM | POA: Diagnosis not present

## 2021-08-15 DIAGNOSIS — N281 Cyst of kidney, acquired: Secondary | ICD-10-CM | POA: Diagnosis not present

## 2021-08-15 DIAGNOSIS — J439 Emphysema, unspecified: Secondary | ICD-10-CM | POA: Diagnosis not present

## 2021-08-15 LAB — URINALYSIS, ROUTINE W REFLEX MICROSCOPIC
Bacteria, UA: NONE SEEN
Bilirubin Urine: NEGATIVE
Glucose, UA: NEGATIVE mg/dL
Hgb urine dipstick: NEGATIVE
Ketones, ur: 20 mg/dL — AB
Leukocytes,Ua: NEGATIVE
Nitrite: NEGATIVE
Protein, ur: 30 mg/dL — AB
Specific Gravity, Urine: 1.026 (ref 1.005–1.030)
pH: 5 (ref 5.0–8.0)

## 2021-08-15 MED ORDER — IOHEXOL 300 MG/ML  SOLN
100.0000 mL | Freq: Once | INTRAMUSCULAR | Status: AC | PRN
  Administered 2021-08-15: 100 mL via INTRAVENOUS

## 2021-08-15 NOTE — ED Provider Notes (Signed)
----------------------------------------- 12:00 AM on 08/15/2021 -----------------------------------------  Assuming care from Dr. Kerman Passey.  In short, Jerry Farley is a 76 y.o. male with multiple complaints.  Refer to the original H&P for additional details.  The current plan of care is to follow-up on CT chest/abdomen/pelvis which is requiring IV contrast dye prep.  Anticipation is for discharge after the scan unless an emergent condition requiring intervention or hospitalization is discovered.   ----------------------------------------- 3:21 AM on 08/15/2021 -----------------------------------------  I personally reviewed the patient's CT chest/abdomen/pelvis and was unable to identify a specific mass but recognize there is some inflammatory changes in the lungs.  I also reviewed the written report from the radiologist and I called Dr. Golden Circle by phone to consult him about the significance of the findings.  The patient has a series of lymph nodes that appear to be necrosis.  In short, Dr. Golden Circle feels this may likely be the source of the patient's weight loss and general malaise but it is not clearly identifiable as a specific neoplasm or a neoplastic process at this time.  Additionally, the patient has some very generalized inflammatory or postinflammatory changes in his lungs that could be an atypical pneumonia or could be representative of metastatic disease.  Regardless, Dr. Brain Hilts agreed that this appears to be a chronic and not acute or emergent process requiring hospitalization if he remains clinically stable.  I updated the patient and his wife about the results.  He has remained hemodynamically stable for 13-1/2 hours at this point.  He confirmed to me that he is not having any respiratory symptoms and I do not believe he would benefit from antibiotics and spite of the atypical appearance of his lungs.  I explained that we are not giving him a specific diagnosis but that he would  benefit from follow-up with oncology.  I sent a message to Dr. Rogue Bussing through [facility] to make him aware the patient and I provided outpatient contact information in the AVS.  Patient and his wife understand and agree with the plan.   Radiology CT scan results:  CT CHEST ABDOMEN PELVIS W CONTRAST  Result Date: 08/15/2021 CLINICAL DATA:  Unintended weight loss with nausea, initial encounter EXAM: CT CHEST, ABDOMEN, AND PELVIS WITH CONTRAST TECHNIQUE: Multidetector CT imaging of the chest, abdomen and pelvis was performed following the standard protocol during bolus administration of intravenous contrast. RADIATION DOSE REDUCTION: This exam was performed according to the departmental dose-optimization program which includes automated exposure control, adjustment of the mA and/or kV according to patient size and/or use of iterative reconstruction technique. CONTRAST:  123mL OMNIPAQUE IOHEXOL 300 MG/ML SOLN. Patient was pre-medicated with 4 hour emergency premedication COMPARISON:  None. FINDINGS: CT CHEST FINDINGS Cardiovascular: Thoracic aorta demonstrates atherosclerotic calcifications without aneurysmal dilatation. Coronary calcifications are seen. No cardiac enlargement is noted. Pulmonary artery as visualized is within normal limits. Defibrillator device is noted. Mediastinum/Nodes: Thoracic inlet is within normal limits. There is a 12 mm short axis lymph node identified in the right paratracheal region. No other sizable hilar or mediastinal adenopathy is noted. The esophagus appears within normal limits. Lungs/Pleura: Lungs are well aerated bilaterally and demonstrate emphysematous change. No focal confluent infiltrate or sizable effusion is seen. Mild tree-in-bud changes are noted throughout both lungs likely related to atypical pneumonia. Musculoskeletal: Degenerative changes of the thoracic spine are noted. No acute rib abnormality is seen. CT ABDOMEN PELVIS FINDINGS Hepatobiliary: Liver demonstrates  some scattered calcifications likely related to granulomas. Mild periportal edema is noted which may be  related volume overload or hepatic inflammatory change. Gallbladder is well distended with dependent gallstones. No wall thickening or pericholecystic fluid is noted. Pancreas: Unremarkable. No pancreatic ductal dilatation or surrounding inflammatory changes. Spleen: Normal in size without focal abnormality. Adrenals/Urinary Tract: Adrenal glands are within normal limits. Kidneys demonstrate a normal enhancement pattern bilaterally. Small cyst is noted in the lower pole of right kidney. No calculi or obstructive changes are seen. The bladder is within normal limits. Stomach/Bowel: Diverticular change of the colon is noted. No evidence of diverticulitis is seen. No obstructive or inflammatory changes are noted. The appendix is not well visualized. No inflammatory changes to suggest appendicitis are noted. The stomach small bowel appear within normal limits. Vascular/Lymphatic: Atherosclerotic calcifications are identified. Scattered Peri aortic lymph nodes are noted with central decreased attenuation consistent with necrosis. The largest of these measures 10 mm in short axis. Adjacent to the pancreatic head and extending superiorly towards the gastroesophageal junction there are multiple areas of fluid attenuation with peripheral enhancement likely related to centrally necrotic lymph nodes. Adjacent to the pancreatic head and portacaval space these measure 16 mm in short axis. The total size of the area surrounding the gastroesophageal junction measures 4.8 x 2.5 cm. Additionally some right iliac chain nodes are noted measuring up to 9 mm. Reproductive: Prostate is unremarkable. Other: No abdominal wall hernia or abnormality. No abdominopelvic ascites. Musculoskeletal: No acute or significant osseous findings. IMPRESSION: Multiple lymph nodes are noted surrounding the gastroesophageal junction, extending towards  the pancreatic head, involving the portacaval space and pre portal region as well as the intra-aortocaval and periaortic and right iliac chain. These demonstrate central decreased attenuation suspicious for necrosis. A definitive primary neoplasm is not identified on this exam. PET-CT and tissue sampling would be helpful. Diverticulosis without diverticulitis. Cholelithiasis. Tree-in-bud changes within the lungs bilaterally likely related to atypical pneumonia. Electronically Signed   By: Inez Catalina M.D.   On: 08/15/2021 03:01        Hinda Kehr, MD 08/15/21 [phone]

## 2021-08-15 NOTE — Discharge Instructions (Signed)
As we discussed, you have some nonspecific changes on your CT scan, including some inflammatory changes in your lungs and what appeared to be some necrotic lymph nodes.  You do not need to stay in the hospital at this time, but you would likely benefit from follow-up in the oncology clinic with Dr. Rogue Bussing or one of his colleagues.  Please call the oncology clinic later today if you have not yet heard from them and explain you were in the emergency department and recommended that you follow-up with oncology.  They should be able to help schedule you a follow-up appointment.  Continue taking your regular medications and try to eat and drink as normal.  Return to the emergency department if you develop new or worsening symptoms that concern you.

## 2021-08-18 ENCOUNTER — Other Ambulatory Visit: Payer: Self-pay

## 2021-08-18 ENCOUNTER — Inpatient Hospital Stay: Payer: Medicare HMO | Attending: Internal Medicine | Admitting: Internal Medicine

## 2021-08-18 ENCOUNTER — Inpatient Hospital Stay: Payer: Medicare HMO

## 2021-08-18 ENCOUNTER — Encounter: Payer: Self-pay | Admitting: Internal Medicine

## 2021-08-18 VITALS — BP 78/44 | HR 67 | Temp 97.8°F | Wt 170.6 lb

## 2021-08-18 DIAGNOSIS — R1900 Intra-abdominal and pelvic swelling, mass and lump, unspecified site: Secondary | ICD-10-CM

## 2021-08-18 DIAGNOSIS — E039 Hypothyroidism, unspecified: Secondary | ICD-10-CM

## 2021-08-18 DIAGNOSIS — I428 Other cardiomyopathies: Secondary | ICD-10-CM | POA: Insufficient documentation

## 2021-08-18 DIAGNOSIS — R978 Other abnormal tumor markers: Secondary | ICD-10-CM | POA: Diagnosis not present

## 2021-08-18 DIAGNOSIS — R935 Abnormal findings on diagnostic imaging of other abdominal regions, including retroperitoneum: Secondary | ICD-10-CM | POA: Insufficient documentation

## 2021-08-18 DIAGNOSIS — I472 Ventricular tachycardia, unspecified: Secondary | ICD-10-CM | POA: Insufficient documentation

## 2021-08-18 DIAGNOSIS — Z87891 Personal history of nicotine dependence: Secondary | ICD-10-CM | POA: Diagnosis not present

## 2021-08-18 DIAGNOSIS — R590 Localized enlarged lymph nodes: Secondary | ICD-10-CM | POA: Diagnosis not present

## 2021-08-18 DIAGNOSIS — Z79899 Other long term (current) drug therapy: Secondary | ICD-10-CM | POA: Insufficient documentation

## 2021-08-18 LAB — COMPREHENSIVE METABOLIC PANEL
ALT: 89 U/L — ABNORMAL HIGH (ref 0–44)
AST: 72 U/L — ABNORMAL HIGH (ref 15–41)
Albumin: 3.9 g/dL (ref 3.5–5.0)
Alkaline Phosphatase: 55 U/L (ref 38–126)
Anion gap: 10 (ref 5–15)
BUN: 14 mg/dL (ref 8–23)
CO2: 27 mmol/L (ref 22–32)
Calcium: 9.5 mg/dL (ref 8.9–10.3)
Chloride: 100 mmol/L (ref 98–111)
Creatinine, Ser: 1.16 mg/dL (ref 0.61–1.24)
GFR, Estimated: >60 mL/min (ref >60)
Glucose, Bld: 117 mg/dL — ABNORMAL HIGH (ref 70–99)
Potassium: 5.2 mmol/L — ABNORMAL HIGH (ref 3.5–5.1)
Sodium: 137 mmol/L (ref 135–145)
Total Bilirubin: 0.8 mg/dL (ref 0.3–1.2)
Total Protein: 7 g/dL (ref 6.5–8.1)

## 2021-08-18 LAB — CBC WITH DIFFERENTIAL/PLATELET
Abs Immature Granulocytes: 0.03 10*3/uL (ref 0.00–0.07)
Basophils Absolute: 0 10*3/uL (ref 0.0–0.1)
Basophils Relative: 0 %
Eosinophils Absolute: 0.2 10*3/uL (ref 0.0–0.5)
Eosinophils Relative: 2 %
HCT: 41.7 % (ref 39.0–52.0)
Hemoglobin: 14.4 g/dL (ref 13.0–17.0)
Immature Granulocytes: 0 %
Lymphocytes Relative: 14 %
Lymphs Abs: 1.3 10*3/uL (ref 0.7–4.0)
MCH: 32.9 pg (ref 26.0–34.0)
MCHC: 34.5 g/dL (ref 30.0–36.0)
MCV: 95.2 fL (ref 80.0–100.0)
Monocytes Absolute: 1 10*3/uL (ref 0.1–1.0)
Monocytes Relative: 10 %
Neutro Abs: 7 10*3/uL (ref 1.7–7.7)
Neutrophils Relative %: 74 %
Platelets: 177 10*3/uL (ref 150–400)
RBC: 4.38 MIL/uL (ref 4.22–5.81)
RDW: 13.5 % (ref 11.5–15.5)
WBC: 9.5 10*3/uL (ref 4.0–10.5)
nRBC: 0 % (ref 0.0–0.2)

## 2021-08-18 LAB — LACTATE DEHYDROGENASE: LDH: 245 U/L — ABNORMAL HIGH (ref 98–192)

## 2021-08-18 NOTE — Assessment & Plan Note (Addendum)
#   Abdominal Lymphadenopathy--gastro esophagus/pancreas/portocaval space/intra-aortocaval and periaortic and right iliac chain-suspicious for necrosis based on imaging.  Highly concerning for malignancy.  I would recommend a PET scan stat for further evaluation.  #I discussed the broad differential diagnosis of abdominal lymphadenopathy including but not limited to benign [less likely; given ongoing symptoms of back pain]-more likely malignant causes [including solid tumors like pancreas/abdominal organs; versus lymphoma].   #Nonischemic cardiomyopathy /status post defibrillator/ablation.  Hypotension: 73G to 68D systolic/diastolic 59E to 70R [chronic]-likely cardiac etiology/question symptoms [extreme fatigue; no dizziness or falls].  Patient not on diuretics.  Aspirin plus Plavix.  Will discuss with cardiology.  Thank you Dr.Forbach for allowing me to participate in the care of your pleasant patient. Please do not hesitate to contact me with questions or concerns in the interim.  # DISPOSITION: # PET scan STAT # labs- ordered today # follow up 1-2 days after the PET scan- MD; no labs-Dr.B  # I reviewed the blood work- with the patient in detail; also reviewed the imaging independently [as summarized above]; and with the patient in detail.    Cc; Dr.Johnston/Dr.Kline

## 2021-08-18 NOTE — Progress Notes (Signed)
[place] NOTE  Patient Care Team: Baxter Hire, MD as PCP - General (Internal Medicine)  CHIEF COMPLAINTS/PURPOSE OF CONSULTATION: Abdominal Lymphadenopathy  # JAN 2023 ER - [BACK PAIN- ]Abdominal Lymphadenopathy --gastro esophagus/pancreas/portocaval space/intra-aortocaval and periaortic and right iliac chain-suspicious for necrosis based on imaging.  Highly concerning for malignancy.    # Nonischemic cardiomyopathy /status post defibrillator/-polymorphic ventricular tachycardia ablation-amiodarone [Dr.Klein]; CHF/ Chronic hypotension: 43X to 54M systolic/diastolic 08Q to 76P [chronic]-NO  diuretics.  Aspirin plus Plavix.   Hypothyroidism- on synthroid    Oncology History   No history exists.     HISTORY OF PRESENTING ILLNESS: Ambulating independently.  Accompanied by his wife. Jerry Farley 52 y.o.  Lorna Few been referred to [place] for further evaluation/management of Lymphdenoapthy  diagnosed on CT scan.   Patient had ablation surgery on his heart in July 2022.  States the patient has had ongoing pain in his mid back for the last many months.  Progressive wakes the patient up multiple times at nighttime.  He takes Tylenol as needed for any symptom relief.  Positive for poor appetite.  Positive for weight loss.  Positive for nausea.  Patient is not on diuretics.  Denies any swelling in the legs.  Review of Systems  Constitutional:  Positive for malaise/fatigue and weight loss. Negative for chills, diaphoresis and fever.  HENT:  Negative for nosebleeds and sore throat.   Eyes:  Negative for double vision.  Respiratory:  Negative for cough, hemoptysis, sputum production, shortness of breath and wheezing.   Cardiovascular:  Negative for chest pain, palpitations, orthopnea and leg swelling.  Gastrointestinal:  Positive for nausea. Negative for abdominal pain, blood in stool, constipation, diarrhea, heartburn, melena and vomiting.  Genitourinary:  Negative for  dysuria, frequency and urgency.  Musculoskeletal:  Negative for back pain and joint pain.  Skin: Negative.  Negative for itching and rash.  Neurological:  Negative for dizziness, tingling, focal weakness, weakness and headaches.  Endo/Heme/Allergies:  Does not bruise/bleed easily.  Psychiatric/Behavioral:  Negative for depression. The patient is not nervous/anxious and does not have insomnia.     MEDICAL HISTORY:  Past Medical History:  Diagnosis Date   6948-lead    Arthritis    "maybe a little"   CAD (coronary artery disease) July 2007   CFX stent (patent Sept '07)   Cardiac defibrillator CRT-medtronic 04/01/931   Chronic systolic congestive heart failure (HCC)    Complication of anesthesia    "problems waking up w/valium"   High cholesterol    Hypertension    Hypothyroidism    ICD (implantable cardiac defibrillator) in place 2006, 2013   Medtronic Protecta XT-DR CRT (01/2012)   NICM (nonischemic cardiomyopathy) ([place])    ejection fraction 40-45% by echo July 2012   Non-ischemic cardiomyopathy- EF 40-45% 2D 7/14 02/11/2012   Obesity (BMI 30-39.9) 08/22/2013   Ventricular tachycardia, non-sustained 2007   Amiodarone    SURGICAL HISTORY: Past Surgical History:  Procedure Laterality Date   BI-VENTRICULAR IMPLANTABLE CARDIOVERTER DEFIBRILLATOR UPGRADE N/A 02/10/2012   Procedure: BI-VENTRICULAR IMPLANTABLE CARDIOVERTER DEFIBRILLATOR UPGRADE;  Surgeon: Deboraha Sprang, MD;  Location: [facility] CATH LAB;  Service: Cardiovascular;  Laterality: N/A;   BIV ICD GENERATOR CHANGEOUT N/A 06/24/2018   Procedure: BIV ICD GENERATOR CHANGEOUT;  Surgeon: Deboraha Sprang, MD;  Location: [place] CV LAB;  Service: Cardiovascular;  Laterality: N/A;   CARDIAC CATHETERIZATION  11/30/2001   non-ischemic cardiomyopahty with high LV end-diastolic pressure; 67% distal L main stenosis, large osital stenosis 70%,  70% Cfx stenosis (Dr. Domenic Moras)   CARDIAC CATHETERIZATION  9/07   CFX stent patent [facility])    CARDIAC DEFIBRILLATOR PLACEMENT  09/2004; 02/10/12   ICD implanted in 2006 (Dr. Rito Ehrlich); EOL generator change in 01/2012 - CRT (BiV ICD) (Dr. Caryl Comes)   CORONARY ANGIOPLASTY WITH STENT PLACEMENT   01/2006   CFX    LEFT HEART CATH AND CORONARY ANGIOGRAPHY N/A 04/13/2019   Procedure: LEFT HEART CATH AND CORONARY ANGIOGRAPHY;  Surgeon: Jettie Booze, MD;  Location: [place] CV LAB;  Service: Cardiovascular;  Laterality: N/A;   NM MYOCAR PERF EJECTION FRACTION  05/2010   dipyridamole myoview; mod perfusion defect in apical, basal inferior, mid inferior & apical inferior regions (infarct/scar); post-stress 28%, low risk scan   TONSILLECTOMY AND ADENOIDECTOMY     "when I was a child"   TRANSTHORACIC ECHOCARDIOGRAM  01/2013   EF 40-45%, LV mildly dilated, mild LVH, mod hypokinesis of inferior myocardium, grade 1 diastolic dysfunction; mild MR   V TACH ABLATION N/A 02/13/2021   Procedure: V TACH ABLATION;  Surgeon: Evans Lance, MD;  Location: [place] CV LAB;  Service: Cardiovascular;  Laterality: N/A;    SOCIAL HISTORY: Social History   Socioeconomic History   Marital status: Married    Spouse name: Not on file   Number of children: Not on file   Years of education: Not on file   Highest education level: Not on file  Occupational History   Occupation: retired -- Armed forces operational officer  Tobacco Use   Smoking status: Former    Packs/day: 1.50    Years: 42.00    Pack years: 63.00    Types: Cigarettes    Quit date: 07/28/2003    Years since quitting: 18.0   Smokeless tobacco: Former    Types: Nurse, children's Use: Never used  Substance and Sexual Activity   Alcohol use: No   Drug use: No   Sexual activity: Never  Other Topics Concern   Not on file  Social History Narrative   Not on file   Social Determinants of Health   Financial Resource Strain: Not on file  Food Insecurity: Not on file  Transportation Needs: Not on file  Physical Activity: Not on file  Stress: Not on  file  Social Connections: Not on file  Intimate Partner Violence: Not on file    FAMILY HISTORY: Family History  Problem Relation Age of Onset   Cancer Mother    Cancer Brother     ALLERGIES:  is allergic to contrast media [iodinated contrast media].  MEDICATIONS:  Current Outpatient Medications  Medication Sig Dispense Refill   acetaminophen (TYLENOL) 325 MG tablet Take 2 tablets (650 mg total) by mouth every 4 (four) hours as needed for headache or mild pain.     amiodarone (PACERONE) 200 MG tablet Take 1 tablet (200 mg total) by mouth daily. 90 tablet 3   aspirin 81 MG tablet Take 81 mg by mouth daily.     carvedilol (COREG) 12.5 MG tablet Take 1 tablet (12.5 mg total) by mouth 2 (two) times daily with a meal. 180 tablet 1   clopidogrel (PLAVIX) 75 MG tablet Take 1 tablet by mouth once daily 90 tablet 3   EUTHYROX 125 MCG tablet TAKE 1 TABLET BY MOUTH ONCE DAILY BEFORE BREAKFAST 90 tablet 1   fish oil-omega-3 fatty acids 1000 MG capsule Take 1 g by mouth daily.     Magnesium Oxide 400 MG CAPS  Take 400 mg by mouth daily.     Multiple Vitamin (MULTIVITAMIN) tablet Take 1 tablet by mouth daily.     oxymetazoline (AFRIN) 0.05 % nasal spray Place 1 spray into both nostrils 2 (two) times daily as needed for congestion.     rosuvastatin (CRESTOR) 20 MG tablet Take 1 tablet by mouth once daily 90 tablet 3   spironolactone (ALDACTONE) 25 MG tablet Take 1/2 (one-half) tablet by mouth once daily 45 tablet 3   No current facility-administered medications for this visit.    PHYSICAL EXAMINATION: ECOG PERFORMANCE STATUS: 2 - Symptomatic, <50% confined to bed  Vitals:   08/18/21 0901  BP: (!) 78/44  Pulse: 67  Temp: 97.8 F (36.6 C)   Filed Weights   08/18/21 0901  Weight: 170 lb 9.6 oz (77.4 kg)    Physical Exam Vitals and nursing note reviewed.  HENT:     Head: Normocephalic and atraumatic.     Mouth/Throat:     Pharynx: Oropharynx is clear.  Eyes:     Extraocular  Movements: Extraocular movements intact.     Pupils: Pupils are equal, round, and reactive to light.  Cardiovascular:     Rate and Rhythm: Normal rate and regular rhythm.  Pulmonary:     Comments: Decreased breath sounds bilaterally.  Abdominal:     Palpations: Abdomen is soft.  Musculoskeletal:        General: Normal range of motion.     Cervical back: Normal range of motion.  Skin:    General: Skin is warm.  Neurological:     General: No focal deficit present.     Mental Status: He is alert and oriented to person, place, and time.  Psychiatric:        Behavior: Behavior normal.        Judgment: Judgment normal.     LABORATORY DATA:  I have reviewed the data as listed Lab Results  Component Value Date   WBC 9.5 08/18/2021   HGB 14.4 08/18/2021   HCT 41.7 08/18/2021   MCV 95.2 08/18/2021   PLT 177 08/18/2021   Recent Labs    09/25/20 1141 12/13/20 1309 01/31/21 0909 07/24/21 0937 08/14/21 1415 08/18/21 1005  NA  --    < > 141 139 136 137  K  --    < > 4.8 4.4 4.8 5.2*  CL  --    < > 105 102 100 100  CO2  --    < > 29 26 27 27   GLUCOSE  --    < > 121* 124* 125* 117*  BUN  --    < > 15 11 11 14   CREATININE  --    < > 1.20 1.13 1.10 1.16  CALCIUM  --    < > 9.2 9.3 9.1 9.5  GFRNONAA  --    < > >60  --  >60 >60  PROT 6.5   < >  --  6.2 6.3* 7.0  ALBUMIN 4.2   < >  --  3.9 3.5 3.9  AST 70*   < >  --  54* 63* 72*  ALT 67*   < >  --  50* 72* 89*  ALKPHOS 61   < >  --  59 43 55  BILITOT 0.6   < >  --  0.5 0.7 0.8  BILIDIR 0.17  --   --   --   --   --    < > = values  in this interval not displayed.    RADIOGRAPHIC STUDIES: I have personally reviewed the radiological images as listed and agreed with the findings in the report. CT CHEST ABDOMEN PELVIS W CONTRAST  Result Date: 08/15/2021 CLINICAL DATA:  Unintended weight loss with nausea, initial encounter EXAM: CT CHEST, ABDOMEN, AND PELVIS WITH CONTRAST TECHNIQUE: Multidetector CT imaging of the chest, abdomen and  pelvis was performed following the standard protocol during bolus administration of intravenous contrast. RADIATION DOSE REDUCTION: This exam was performed according to the departmental dose-optimization program which includes automated exposure control, adjustment of the mA and/or kV according to patient size and/or use of iterative reconstruction technique. CONTRAST:  163mL OMNIPAQUE IOHEXOL 300 MG/ML SOLN. Patient was pre-medicated with 4 hour emergency premedication COMPARISON:  None. FINDINGS: CT CHEST FINDINGS Cardiovascular: Thoracic aorta demonstrates atherosclerotic calcifications without aneurysmal dilatation. Coronary calcifications are seen. No cardiac enlargement is noted. Pulmonary artery as visualized is within normal limits. Defibrillator device is noted. Mediastinum/Nodes: Thoracic inlet is within normal limits. There is a 12 mm short axis lymph node identified in the right paratracheal region. No other sizable hilar or mediastinal adenopathy is noted. The esophagus appears within normal limits. Lungs/Pleura: Lungs are well aerated bilaterally and demonstrate emphysematous change. No focal confluent infiltrate or sizable effusion is seen. Mild tree-in-bud changes are noted throughout both lungs likely related to atypical pneumonia. Musculoskeletal: Degenerative changes of the thoracic spine are noted. No acute rib abnormality is seen. CT ABDOMEN PELVIS FINDINGS Hepatobiliary: Liver demonstrates some scattered calcifications likely related to granulomas. Mild periportal edema is noted which may be related volume overload or hepatic inflammatory change. Gallbladder is well distended with dependent gallstones. No wall thickening or pericholecystic fluid is noted. Pancreas: Unremarkable. No pancreatic ductal dilatation or surrounding inflammatory changes. Spleen: Normal in size without focal abnormality. Adrenals/Urinary Tract: Adrenal glands are within normal limits. Kidneys demonstrate a normal  enhancement pattern bilaterally. Small cyst is noted in the lower pole of right kidney. No calculi or obstructive changes are seen. The bladder is within normal limits. Stomach/Bowel: Diverticular change of the colon is noted. No evidence of diverticulitis is seen. No obstructive or inflammatory changes are noted. The appendix is not well visualized. No inflammatory changes to suggest appendicitis are noted. The stomach small bowel appear within normal limits. Vascular/Lymphatic: Atherosclerotic calcifications are identified. Scattered Peri aortic lymph nodes are noted with central decreased attenuation consistent with necrosis. The largest of these measures 10 mm in short axis. Adjacent to the pancreatic head and extending superiorly towards the gastroesophageal junction there are multiple areas of fluid attenuation with peripheral enhancement likely related to centrally necrotic lymph nodes. Adjacent to the pancreatic head and portacaval space these measure 16 mm in short axis. The total size of the area surrounding the gastroesophageal junction measures 4.8 x 2.5 cm. Additionally some right iliac chain nodes are noted measuring up to 9 mm. Reproductive: Prostate is unremarkable. Other: No abdominal wall hernia or abnormality. No abdominopelvic ascites. Musculoskeletal: No acute or significant osseous findings. IMPRESSION: Multiple lymph nodes are noted surrounding the gastroesophageal junction, extending towards the pancreatic head, involving the portacaval space and pre portal region as well as the intra-aortocaval and periaortic and right iliac chain. These demonstrate central decreased attenuation suspicious for necrosis. A definitive primary neoplasm is not identified on this exam. PET-CT and tissue sampling would be helpful. Diverticulosis without diverticulitis. Cholelithiasis. Tree-in-bud changes within the lungs bilaterally likely related to atypical pneumonia. Electronically Signed   By: Linus Mako.D.  On: 08/15/2021 03:01   ECHOCARDIOGRAM COMPLETE  Result Date: 08/11/2021    ECHOCARDIOGRAM REPORT   Patient Name:   Jerry Farley Date of Exam: 08/11/2021 Medical Rec #:  [id number]       Height:       67.0 in Accession #:    [id number]      Weight:       176.0 lb Date of Birth:  June 26, 1946       BSA:          1.915 m Patient Age:    76 years        BP:           101/61 mmHg Patient Gender: M               HR:           75 bpm. Exam Location:  [place] Procedure: 2D Echo, 3D Echo, Cardiac Doppler and Color Doppler Indications:    I47.20 Ventricular tachycardia  History:        Patient has prior history of Echocardiogram examinations, most                 recent 06/27/2015. NICM and CHF, CAD, Defibrillator; Risk                 Factors:HLD.  Sonographer:    Marygrace Drought RCS Referring Phys: [id number] [place]  1. Left ventricular ejection fraction, by estimation, is 20 to 25%. The left ventricle has severely decreased function. The left ventricle demonstrates global hypokinesis. Left ventricular diastolic parameters are consistent with Grade I diastolic dysfunction (impaired relaxation).  2. Right ventricular systolic function is mildly reduced. The right ventricular size is normal. There is normal pulmonary artery systolic pressure.  3. The mitral valve is normal in structure. Mild mitral valve regurgitation. No evidence of mitral stenosis.  4. The aortic valve is tricuspid. Aortic valve regurgitation is mild. Aortic valve sclerosis/calcification is present, without any evidence of aortic stenosis.  5. The inferior vena cava is normal in size with greater than 50% respiratory variability, suggesting right atrial pressure of 3 mmHg. FINDINGS  Left Ventricle: Left ventricular ejection fraction, by estimation, is 20 to 25%. The left ventricle has severely decreased function. The left ventricle demonstrates global hypokinesis. The left ventricular internal cavity size was normal in size.  There is no left ventricular hypertrophy. Left ventricular diastolic parameters are consistent with Grade I diastolic dysfunction (impaired relaxation). Right Ventricle: The right ventricular size is normal. Right vetricular wall thickness was not well visualized. Right ventricular systolic function is mildly reduced. There is normal pulmonary artery systolic pressure. The tricuspid regurgitant velocity is 1.94 m/s, and with an assumed right atrial pressure of 3 mmHg, the estimated right ventricular systolic pressure is 29.9 mmHg. Left Atrium: Left atrial size was normal in size. Right Atrium: Right atrial size was normal in size. Pericardium: There is no evidence of pericardial effusion. Mitral Valve: The mitral valve is normal in structure. Mild mitral valve regurgitation. No evidence of mitral valve stenosis. Tricuspid Valve: The tricuspid valve is normal in structure. Tricuspid valve regurgitation is trivial. Aortic Valve: The aortic valve is tricuspid. Aortic valve regurgitation is mild. Aortic valve sclerosis/calcification is present, without any evidence of aortic stenosis. Pulmonic Valve: The pulmonic valve was not well visualized. Pulmonic valve regurgitation is trivial. Aorta: The aortic root and ascending aorta are structurally normal, with no evidence of dilitation. Venous: The inferior vena cava is normal in  size with greater than 50% respiratory variability, suggesting right atrial pressure of 3 mmHg. IAS/Shunts: The interatrial septum was not well visualized.  LEFT VENTRICLE PLAX 2D LVIDd:         5.00 cm LVIDs:         4.50 cm LV PW:         0.80 cm LV IVS:        0.70 cm LVOT diam:     1.90 cm LV SV:         195 LV SV Index:   102 LVOT Area:     2.84 cm  RIGHT VENTRICLE RV Basal diam:  2.50 cm LEFT ATRIUM         Index LA diam:    4.30 cm 2.25 cm/m  AORTIC VALVE LVOT VTI:    0.688 m MV E velocity: 30.90 cm/s    TRICUSPID VALVE MV A velocity: 7280.00 cm/s  TR Peak grad:   15.1 mmHg MV E/A ratio:   0.00          TR Vmax:        194.00 cm/s                               SHUNTS                              Systemic VTI:  0.69 m                              Systemic Diam: 1.90 cm Oswaldo Milian MD Electronically signed by Oswaldo Milian MD Signature Date/Time: 08/11/2021/4:32:35 PM    Final    CUP PACEART INCLINIC DEVICE CHECK  Result Date: 08/07/2021 CRT-D device check in office. Thresholds and sensing consistent with previous device measurements. Lead impedance trends stable over time. No mode switch episodes recorded. No ventricular arrhythmia episodes recorded. Patient bi-ventricularly pacing 89% of the time. Device programmed with appropriate safety margins. Heart failure diagnostics reviewed and trends are stable for patient. No changes made this session. Estimated longevity _2.6 years__.  Patient enrolled in remote follow up. Plan to check device remotely in 3 months se note for full discussion suspect the NSVT events are what is reducing his BP%   ASSESSMENT & PLAN:   Lymphadenopathy, abdominal # Abdominal Lymphadenopathy--gastro esophagus/pancreas/portocaval space/intra-aortocaval and periaortic and right iliac chain-suspicious for necrosis based on imaging.  Highly concerning for malignancy.  I would recommend a PET scan stat for further evaluation.  #I discussed the broad differential diagnosis of abdominal lymphadenopathy including but not limited to benign [less likely; given ongoing symptoms of back pain]-more likely malignant causes [including solid tumors like pancreas/abdominal organs; versus lymphoma].   #Nonischemic cardiomyopathy /status post defibrillator/ablation.  Hypotension: 61W to 43X systolic/diastolic 54M to 08Q [chronic]-likely cardiac etiology/question symptoms [extreme fatigue; no dizziness or falls].  Patient not on diuretics.  Aspirin plus Plavix.  Will discuss with cardiology.  Thank you Dr.Forbach for allowing me to participate in the care of your  pleasant patient. Please do not hesitate to contact me with questions or concerns in the interim.  # DISPOSITION: # PET scan STAT # labs- ordered today # follow up 1-2 days after the PET scan- MD; no labs-Dr.B  # I reviewed the blood work- with the patient in detail; also reviewed the imaging independently [as summarized  above]; and with the patient in detail.    Cc; Dr.Johnston/Dr.Kline    All questions were answered. The patient knows to call the clinic with any problems, questions or concerns.       Cammie Sickle, MD 08/18/2021 2:26 PM

## 2021-08-19 LAB — CEA: CEA: 4.5 ng/mL (ref 0.0–4.7)

## 2021-08-19 LAB — CANCER ANTIGEN 19-9: CA 19-9: 765 U/mL — ABNORMAL HIGH (ref 0–35)

## 2021-08-20 NOTE — Progress Notes (Signed)
Jerry Farley and spoke patients wife Will decrease carvedilol to 3.125 bid and stop aldactone

## 2021-08-20 NOTE — Progress Notes (Signed)
Called patient following his diagnosis No answer

## 2021-08-21 ENCOUNTER — Inpatient Hospital Stay: Admission: RE | Admit: 2021-08-21 | Payer: Medicare HMO | Source: Ambulatory Visit

## 2021-08-21 ENCOUNTER — Ambulatory Visit: Admission: RE | Admit: 2021-08-21 | Payer: Medicare HMO | Source: Ambulatory Visit

## 2021-08-22 ENCOUNTER — Encounter: Payer: Self-pay | Admitting: Internal Medicine

## 2021-08-22 ENCOUNTER — Telehealth: Payer: Self-pay | Admitting: Internal Medicine

## 2021-08-22 ENCOUNTER — Other Ambulatory Visit: Payer: Self-pay

## 2021-08-22 DIAGNOSIS — R630 Anorexia: Secondary | ICD-10-CM | POA: Diagnosis not present

## 2021-08-22 DIAGNOSIS — Z5321 Procedure and treatment not carried out due to patient leaving prior to being seen by health care provider: Secondary | ICD-10-CM | POA: Diagnosis not present

## 2021-08-22 LAB — URINALYSIS, MICROSCOPIC (REFLEX)

## 2021-08-22 LAB — URINALYSIS, ROUTINE W REFLEX MICROSCOPIC
Glucose, UA: NEGATIVE mg/dL
Hgb urine dipstick: NEGATIVE
Ketones, ur: 15 mg/dL — AB
Leukocytes,Ua: NEGATIVE
Nitrite: NEGATIVE
Specific Gravity, Urine: 1.025 (ref 1.005–1.030)
pH: 5.5 (ref 5.0–8.0)

## 2021-08-22 LAB — COMPREHENSIVE METABOLIC PANEL
ALT: 83 U/L — ABNORMAL HIGH (ref 0–44)
AST: 66 U/L — ABNORMAL HIGH (ref 15–41)
Albumin: 3.3 g/dL — ABNORMAL LOW (ref 3.5–5.0)
Alkaline Phosphatase: 53 U/L (ref 38–126)
Anion gap: 7 (ref 5–15)
BUN: 11 mg/dL (ref 8–23)
CO2: 29 mmol/L (ref 22–32)
Calcium: 9.1 mg/dL (ref 8.9–10.3)
Chloride: 100 mmol/L (ref 98–111)
Creatinine, Ser: 1.07 mg/dL (ref 0.61–1.24)
GFR, Estimated: >60 mL/min (ref >60)
Glucose, Bld: 116 mg/dL — ABNORMAL HIGH (ref 70–99)
Potassium: 5.3 mmol/L — ABNORMAL HIGH (ref 3.5–5.1)
Sodium: 136 mmol/L (ref 135–145)
Total Bilirubin: 0.9 mg/dL (ref 0.3–1.2)
Total Protein: 6.2 g/dL — ABNORMAL LOW (ref 6.5–8.1)

## 2021-08-22 LAB — CBC
HCT: 41.2 % (ref 39.0–52.0)
Hemoglobin: 13.7 g/dL (ref 13.0–17.0)
MCH: 32.2 pg (ref 26.0–34.0)
MCHC: 33.3 g/dL (ref 30.0–36.0)
MCV: 96.7 fL (ref 80.0–100.0)
Platelets: 189 10*3/uL (ref 150–400)
RBC: 4.26 MIL/uL (ref 4.22–5.81)
RDW: 13.6 % (ref 11.5–15.5)
WBC: 8.5 10*3/uL (ref 4.0–10.5)
nRBC: 0 % (ref 0.0–0.2)

## 2021-08-22 LAB — LIPASE, BLOOD: Lipase: 31 U/L (ref 11–51)

## 2021-08-22 MED ORDER — CARVEDILOL 3.125 MG PO TABS: 3.1250 mg | ORAL_TABLET | Freq: Two times a day (BID) | ORAL | 3 refills | Status: DC

## 2021-08-22 NOTE — Telephone Encounter (Signed)
Attempted phone call to pt's wife, DPR and left voicemail message to contact RN at [phone].

## 2021-08-22 NOTE — Progress Notes (Unsigned)
MRobertson RN and I have both spoken with the patient's wife today and earlier this week.  Continues to struggle with severe back pain, unable to eat, a few bites of a sandwich yesterday half a glass of milk today are his caloric intake apart from some Pedialyte.  We have had to down titrate his medications because of hypotension. I reviewed the situation with Dr. Janese Banks this evening; she has not seen the patient but concurred that with the aforementioned symptoms that it was reasonable that he go to the hospital and we both think that admission is appropriate given the ongoing and worsening problems following his ER visit earlier this week.  I have spoken with charge RN at [facility] ER.

## 2021-08-22 NOTE — Telephone Encounter (Signed)
Spoke with pt's wife who reports pt's BP today is 98/58.  Pt continues to have low BP, is not eating and drinking very little but has had some Pedialyte.  She reports pt continues to complain of pain in back and hip.  He is waiting for Insurance to approve PET scan that was ordered as STAT on 08/18/2021 by oncology.  She would like clarification re: pt's Carvedilol.  She states Dr Caryl Comes instructed her on 08/20/2021 to stop Carvedilol.   Pt's wife advised according to Dr Olin Pia note pt was to stop Spironolactone and decrease Carvedilol to 3.125 bid.  She reports pt has not stopped Spironolactone but she will stop this medication.  Discussed dehydration can contribute to low BP.  Advised to encourage fluids as much as possible. Will confirm Carvedilol dose with Dr Caryl Comes and contact pt's wife.  Pt's wife verbalizes understanding and agrees with current plan.

## 2021-08-22 NOTE — Addendum Note (Signed)
Addended by: Thora Lance on: 08/22/2021 06:05 PM   Modules accepted: Orders

## 2021-08-22 NOTE — Addendum Note (Signed)
Addended by: Thora Lance on: 08/22/2021 05:18 PM   Modules accepted: Orders

## 2021-08-22 NOTE — ED Triage Notes (Addendum)
Per pt dr. Caryl Comes states pt needs to be admitted. Per pt has been having loss of appetitie. Pt is cussing and yelling at this RN in triage about wait and spouse requesting certain ED staff not in room for triage. Per spouse pt has been able to drink. Per spouse pt has a suspicous PET scan. Pt states symptoms began in July after heart "was cleaned out". Pt's spouse states pt's blood pressure has been low for several weeks.

## 2021-08-22 NOTE — Telephone Encounter (Signed)
Dr Caryl Comes has contacted pt's wife and the Oncology on call provider.  Both providers feel pt should present to the ED.  Dr Caryl Comes has contacted the triage nurse at [facility].  Pt's wife verbalizes understanding and agrees with current plan.  Per Dr Olin Pia note on 08/18/2021: Will decrease carvedilol to 3.125 bid and stop aldactone

## 2021-08-22 NOTE — Telephone Encounter (Signed)
Pt c/o BP issue: STAT if pt c/o blurred vision, one-sided weakness or slurred speech  1. What are your last 5 BP readings? 90's/50's  2. Are you having any other symptoms (ex. Dizziness, headache, blurred vision, passed out)? Sick, not eating, back hurts, hip hurts, gets dizzy  3. What is your BP issue? Patient's wife states the patient was told to stop his carvedilol. She says she took the patient off the medication, but his BP is still low. She states he won't eat. She says she has given his chicken noodle soups and broths. She states she will be at a funeral until at least 4:30 pm.

## 2021-08-23 ENCOUNTER — Emergency Department
Admission: EM | Admit: 2021-08-23 | Discharge: 2021-08-23 | Disposition: A | Discharge status: Home / Self Care | Payer: Medicare HMO | Attending: Emergency Medicine | Admitting: Emergency Medicine

## 2021-08-24 ENCOUNTER — Other Ambulatory Visit: Payer: Self-pay

## 2021-08-24 ENCOUNTER — Inpatient Hospital Stay (HOSPITAL_COMMUNITY)
Admission: EM | Admit: 2021-08-24 | Discharge: 2021-09-01 | DRG: 374 | Disposition: A | Discharge status: Home Health | Payer: Medicare HMO | Attending: Internal Medicine | Admitting: Internal Medicine

## 2021-08-24 ENCOUNTER — Encounter (HOSPITAL_COMMUNITY): Payer: Self-pay | Admitting: Emergency Medicine

## 2021-08-24 DIAGNOSIS — E46 Unspecified protein-calorie malnutrition: Secondary | ICD-10-CM

## 2021-08-24 DIAGNOSIS — D649 Anemia, unspecified: Secondary | ICD-10-CM | POA: Diagnosis present

## 2021-08-24 DIAGNOSIS — R933 Abnormal findings on diagnostic imaging of other parts of digestive tract: Secondary | ICD-10-CM

## 2021-08-24 DIAGNOSIS — I110 Hypertensive heart disease with heart failure: Secondary | ICD-10-CM | POA: Diagnosis present

## 2021-08-24 DIAGNOSIS — Z20822 Contact with and (suspected) exposure to covid-19: Secondary | ICD-10-CM | POA: Diagnosis present

## 2021-08-24 DIAGNOSIS — Z95810 Presence of automatic (implantable) cardiac defibrillator: Secondary | ICD-10-CM

## 2021-08-24 DIAGNOSIS — K8020 Calculus of gallbladder without cholecystitis without obstruction: Secondary | ICD-10-CM | POA: Diagnosis present

## 2021-08-24 DIAGNOSIS — D696 Thrombocytopenia, unspecified: Secondary | ICD-10-CM | POA: Diagnosis present

## 2021-08-24 DIAGNOSIS — Z87891 Personal history of nicotine dependence: Secondary | ICD-10-CM

## 2021-08-24 DIAGNOSIS — R6881 Early satiety: Secondary | ICD-10-CM | POA: Diagnosis present

## 2021-08-24 DIAGNOSIS — Z8673 Personal history of transient ischemic attack (TIA), and cerebral infarction without residual deficits: Secondary | ICD-10-CM | POA: Diagnosis not present

## 2021-08-24 DIAGNOSIS — R1314 Dysphagia, pharyngoesophageal phase: Secondary | ICD-10-CM | POA: Diagnosis present

## 2021-08-24 DIAGNOSIS — R970 Elevated carcinoembryonic antigen [CEA]: Secondary | ICD-10-CM | POA: Diagnosis present

## 2021-08-24 DIAGNOSIS — K5790 Diverticulosis of intestine, part unspecified, without perforation or abscess without bleeding: Secondary | ICD-10-CM | POA: Diagnosis present

## 2021-08-24 DIAGNOSIS — R978 Other abnormal tumor markers: Secondary | ICD-10-CM | POA: Diagnosis not present

## 2021-08-24 DIAGNOSIS — T18128A Food in esophagus causing other injury, initial encounter: Secondary | ICD-10-CM | POA: Diagnosis present

## 2021-08-24 DIAGNOSIS — R531 Weakness: Secondary | ICD-10-CM

## 2021-08-24 DIAGNOSIS — I428 Other cardiomyopathies: Secondary | ICD-10-CM | POA: Diagnosis present

## 2021-08-24 DIAGNOSIS — C160 Malignant neoplasm of cardia: Principal | ICD-10-CM | POA: Diagnosis present

## 2021-08-24 DIAGNOSIS — K2270 Barrett's esophagus without dysplasia: Secondary | ICD-10-CM | POA: Diagnosis not present

## 2021-08-24 DIAGNOSIS — R918 Other nonspecific abnormal finding of lung field: Secondary | ICD-10-CM | POA: Diagnosis present

## 2021-08-24 DIAGNOSIS — Z6825 Body mass index (BMI) 25.0-25.9, adult: Secondary | ICD-10-CM

## 2021-08-24 DIAGNOSIS — Z803 Family history of malignant neoplasm of breast: Secondary | ICD-10-CM | POA: Diagnosis not present

## 2021-08-24 DIAGNOSIS — R634 Abnormal weight loss: Secondary | ICD-10-CM | POA: Diagnosis present

## 2021-08-24 DIAGNOSIS — R1319 Other dysphagia: Secondary | ICD-10-CM

## 2021-08-24 DIAGNOSIS — Z79899 Other long term (current) drug therapy: Secondary | ICD-10-CM

## 2021-08-24 DIAGNOSIS — I2510 Atherosclerotic heart disease of native coronary artery without angina pectoris: Secondary | ICD-10-CM | POA: Diagnosis present

## 2021-08-24 DIAGNOSIS — I509 Heart failure, unspecified: Secondary | ICD-10-CM | POA: Diagnosis not present

## 2021-08-24 DIAGNOSIS — E782 Mixed hyperlipidemia: Secondary | ICD-10-CM | POA: Diagnosis present

## 2021-08-24 DIAGNOSIS — I472 Ventricular tachycardia, unspecified: Secondary | ICD-10-CM | POA: Diagnosis present

## 2021-08-24 DIAGNOSIS — K3189 Other diseases of stomach and duodenum: Secondary | ICD-10-CM | POA: Diagnosis not present

## 2021-08-24 DIAGNOSIS — E039 Hypothyroidism, unspecified: Secondary | ICD-10-CM | POA: Diagnosis present

## 2021-08-24 DIAGNOSIS — R1013 Epigastric pain: Secondary | ICD-10-CM | POA: Diagnosis not present

## 2021-08-24 DIAGNOSIS — R627 Adult failure to thrive: Secondary | ICD-10-CM | POA: Diagnosis present

## 2021-08-24 DIAGNOSIS — I5042 Chronic combined systolic (congestive) and diastolic (congestive) heart failure: Secondary | ICD-10-CM | POA: Diagnosis present

## 2021-08-24 DIAGNOSIS — Z79890 Hormone replacement therapy (postmenopausal): Secondary | ICD-10-CM

## 2021-08-24 DIAGNOSIS — R1310 Dysphagia, unspecified: Secondary | ICD-10-CM | POA: Diagnosis not present

## 2021-08-24 DIAGNOSIS — Z7902 Long term (current) use of antithrombotics/antiplatelets: Secondary | ICD-10-CM

## 2021-08-24 DIAGNOSIS — Z91041 Radiographic dye allergy status: Secondary | ICD-10-CM

## 2021-08-24 DIAGNOSIS — Z7982 Long term (current) use of aspirin: Secondary | ICD-10-CM

## 2021-08-24 DIAGNOSIS — Z955 Presence of coronary angioplasty implant and graft: Secondary | ICD-10-CM

## 2021-08-24 DIAGNOSIS — E43 Unspecified severe protein-calorie malnutrition: Secondary | ICD-10-CM | POA: Diagnosis present

## 2021-08-24 DIAGNOSIS — R110 Nausea: Secondary | ICD-10-CM | POA: Diagnosis not present

## 2021-08-24 DIAGNOSIS — K319 Disease of stomach and duodenum, unspecified: Secondary | ICD-10-CM | POA: Diagnosis not present

## 2021-08-24 DIAGNOSIS — I959 Hypotension, unspecified: Secondary | ICD-10-CM | POA: Diagnosis present

## 2021-08-24 DIAGNOSIS — R591 Generalized enlarged lymph nodes: Secondary | ICD-10-CM | POA: Diagnosis present

## 2021-08-24 DIAGNOSIS — M1990 Unspecified osteoarthritis, unspecified site: Secondary | ICD-10-CM | POA: Diagnosis present

## 2021-08-24 DIAGNOSIS — R0600 Dyspnea, unspecified: Secondary | ICD-10-CM | POA: Diagnosis not present

## 2021-08-24 DIAGNOSIS — D490 Neoplasm of unspecified behavior of digestive system: Secondary | ICD-10-CM | POA: Diagnosis not present

## 2021-08-24 LAB — URINALYSIS, ROUTINE W REFLEX MICROSCOPIC
Bilirubin Urine: NEGATIVE
Glucose, UA: NEGATIVE mg/dL
Hgb urine dipstick: NEGATIVE
Ketones, ur: 5 mg/dL — AB
Leukocytes,Ua: NEGATIVE
Nitrite: NEGATIVE
Protein, ur: NEGATIVE mg/dL
Specific Gravity, Urine: 1.014 (ref 1.005–1.030)
pH: 6 (ref 5.0–8.0)

## 2021-08-24 LAB — COMPREHENSIVE METABOLIC PANEL
ALT: 74 U/L — ABNORMAL HIGH (ref 0–44)
AST: 57 U/L — ABNORMAL HIGH (ref 15–41)
Albumin: 3.1 g/dL — ABNORMAL LOW (ref 3.5–5.0)
Alkaline Phosphatase: 55 U/L (ref 38–126)
Anion gap: 8 (ref 5–15)
BUN: 10 mg/dL (ref 8–23)
CO2: 26 mmol/L (ref 22–32)
Calcium: 8.7 mg/dL — ABNORMAL LOW (ref 8.9–10.3)
Chloride: 103 mmol/L (ref 98–111)
Creatinine, Ser: 0.96 mg/dL (ref 0.61–1.24)
GFR, Estimated: >60 mL/min (ref >60)
Glucose, Bld: 96 mg/dL (ref 70–99)
Potassium: 4.6 mmol/L (ref 3.5–5.1)
Sodium: 137 mmol/L (ref 135–145)
Total Bilirubin: 0.7 mg/dL (ref 0.3–1.2)
Total Protein: 5.6 g/dL — ABNORMAL LOW (ref 6.5–8.1)

## 2021-08-24 LAB — CBC WITH DIFFERENTIAL/PLATELET
Abs Immature Granulocytes: 0.04 10*3/uL (ref 0.00–0.07)
Basophils Absolute: 0 10*3/uL (ref 0.0–0.1)
Basophils Relative: 0 %
Eosinophils Absolute: 0.2 10*3/uL (ref 0.0–0.5)
Eosinophils Relative: 2 %
HCT: 39.6 % (ref 39.0–52.0)
Hemoglobin: 13.6 g/dL (ref 13.0–17.0)
Immature Granulocytes: 1 %
Lymphocytes Relative: 17 %
Lymphs Abs: 1.4 10*3/uL (ref 0.7–4.0)
MCH: 32.5 pg (ref 26.0–34.0)
MCHC: 34.3 g/dL (ref 30.0–36.0)
MCV: 94.5 fL (ref 80.0–100.0)
Monocytes Absolute: 0.9 10*3/uL (ref 0.1–1.0)
Monocytes Relative: 11 %
Neutro Abs: 5.7 10*3/uL (ref 1.7–7.7)
Neutrophils Relative %: 69 %
Platelets: 147 10*3/uL — ABNORMAL LOW (ref 150–400)
RBC: 4.19 MIL/uL — ABNORMAL LOW (ref 4.22–5.81)
RDW: 13.7 % (ref 11.5–15.5)
WBC: 8.2 10*3/uL (ref 4.0–10.5)
nRBC: 0 % (ref 0.0–0.2)

## 2021-08-24 LAB — LIPASE, BLOOD: Lipase: 29 U/L (ref 11–51)

## 2021-08-24 LAB — RESP PANEL BY RT-PCR (FLU A&B, COVID) ARPGX2
Influenza A by PCR: NEGATIVE
Influenza B by PCR: NEGATIVE
SARS Coronavirus 2 by RT PCR: NEGATIVE

## 2021-08-24 MED ORDER — LACTATED RINGERS IV BOLUS
1000.0000 mL | Freq: Once | INTRAVENOUS | Status: AC
  Administered 2021-08-24: 1000 mL via INTRAVENOUS

## 2021-08-24 MED ORDER — LACTATED RINGERS IV SOLN: INTRAVENOUS | Status: DC

## 2021-08-24 MED ORDER — CARVEDILOL 3.125 MG PO TABS
3.1250 mg | ORAL_TABLET | Freq: Two times a day (BID) | ORAL | Status: DC
  Filled 2021-08-24: qty 1

## 2021-08-24 MED ORDER — ROSUVASTATIN CALCIUM 20 MG PO TABS
20.0000 mg | ORAL_TABLET | Freq: Every day | ORAL | Status: DC
  Administered 2021-08-24 – 2021-09-01 (×9): 20 mg via ORAL
  Filled 2021-08-24 (×10): qty 1

## 2021-08-24 MED ORDER — ACETAMINOPHEN 650 MG RE SUPP: 650.0000 mg | Freq: Four times a day (QID) | RECTAL | Status: DC | PRN

## 2021-08-24 MED ORDER — ACETAMINOPHEN 325 MG PO TABS: 650.0000 mg | ORAL_TABLET | ORAL | Status: DC | PRN

## 2021-08-24 MED ORDER — SODIUM CHLORIDE 0.9 % IV SOLN: INTRAVENOUS | Status: DC

## 2021-08-24 MED ORDER — ACETAMINOPHEN 325 MG PO TABS
650.0000 mg | ORAL_TABLET | Freq: Four times a day (QID) | ORAL | Status: DC | PRN
  Administered 2021-08-25 – 2021-09-01 (×6): 650 mg via ORAL
  Filled 2021-08-24 (×7): qty 2

## 2021-08-24 MED ORDER — OXYMETAZOLINE HCL 0.05 % NA SOLN
1.0000 | Freq: Two times a day (BID) | NASAL | Status: AC | PRN
  Administered 2021-08-25: 1 via NASAL
  Filled 2021-08-24 (×2): qty 30

## 2021-08-24 MED ORDER — OXYCODONE-ACETAMINOPHEN 5-325 MG PO TABS
1.0000 | ORAL_TABLET | ORAL | Status: DC | PRN
  Administered 2021-08-24 – 2021-08-25 (×3): 1 via ORAL
  Filled 2021-08-24 (×4): qty 1

## 2021-08-24 MED ORDER — AMIODARONE HCL 200 MG PO TABS
200.0000 mg | ORAL_TABLET | Freq: Every day | ORAL | Status: DC
  Administered 2021-08-24 – 2021-09-01 (×9): 200 mg via ORAL
  Filled 2021-08-24 (×9): qty 1

## 2021-08-24 MED ORDER — PANTOPRAZOLE SODIUM 40 MG IV SOLR
40.0000 mg | Freq: Every day | INTRAVENOUS | Status: DC
  Administered 2021-08-24 – 2021-08-28 (×5): 40 mg via INTRAVENOUS
  Filled 2021-08-24 (×5): qty 40

## 2021-08-24 MED ORDER — ONDANSETRON HCL 4 MG PO TABS: 4.0000 mg | ORAL_TABLET | Freq: Four times a day (QID) | ORAL | Status: DC | PRN

## 2021-08-24 MED ORDER — MAGNESIUM OXIDE -MG SUPPLEMENT 400 (240 MG) MG PO TABS
400.0000 mg | ORAL_TABLET | Freq: Every day | ORAL | Status: DC
  Administered 2021-08-24 – 2021-09-01 (×9): 400 mg via ORAL
  Filled 2021-08-24 (×9): qty 1

## 2021-08-24 MED ORDER — ASPIRIN EC 81 MG PO TBEC
81.0000 mg | DELAYED_RELEASE_TABLET | Freq: Every day | ORAL | Status: DC
  Administered 2021-08-24 – 2021-09-01 (×9): 81 mg via ORAL
  Filled 2021-08-24 (×9): qty 1

## 2021-08-24 MED ORDER — ONDANSETRON HCL 4 MG/2ML IJ SOLN
4.0000 mg | Freq: Four times a day (QID) | INTRAMUSCULAR | Status: DC | PRN
  Administered 2021-08-27 – 2021-09-01 (×5): 4 mg via INTRAVENOUS
  Filled 2021-08-24 (×4): qty 2

## 2021-08-24 MED ORDER — LEVOTHYROXINE SODIUM 25 MCG PO TABS
125.0000 ug | ORAL_TABLET | Freq: Every day | ORAL | Status: DC
  Administered 2021-08-26 – 2021-09-01 (×7): 125 ug via ORAL
  Filled 2021-08-24 (×6): qty 1

## 2021-08-24 MED ORDER — POLYETHYLENE GLYCOL 3350 17 G PO PACK: 17.0000 g | PACK | Freq: Every day | ORAL | Status: DC | PRN

## 2021-08-24 NOTE — ED Provider Notes (Signed)
[facility] Provider Note   CSN: [id number] Arrival date & time: 08/24/21  0111     History  Chief Complaint  Patient presents with   Weakness   Nausea    Jerry Farley is a 76 y.o. male.   Weakness Severity:  Moderate Duration:  10 weeks Timing:  Constant Progression:  Worsening Chronicity:  New Context: not alcohol use, not change in medication and not dehydration   Relieved by:  None tried Worsened by:  Nothing Ineffective treatments:  None tried Associated symptoms: dizziness, nausea and vomiting   Associated symptoms: no syncope       Home Medications Prior to Admission medications   Medication Sig Start Date End Date Taking? Authorizing Provider  acetaminophen (TYLENOL) 500 MG tablet Take 500 mg by mouth every 6 (six) hours as needed for headache or moderate pain.   Yes [provider]  amiodarone (PACERONE) 200 MG tablet Take 1 tablet (200 mg total) by mouth daily. 07/24/21  Yes Baldwin Jamaica, PA-C  aspirin 81 MG tablet Take 81 mg by mouth daily.   Yes [provider]  carvedilol (COREG) 3.125 MG tablet Take 1 tablet (3.125 mg total) by mouth 2 (two) times daily. 08/22/21  Yes Deboraha Sprang, MD  clopidogrel (PLAVIX) 75 MG tablet Take 1 tablet by mouth once daily 05/20/21  Yes Deboraha Sprang, MD  EUTHYROX 125 MCG tablet TAKE 1 TABLET BY MOUTH ONCE DAILY BEFORE BREAKFAST Patient taking differently: Take 125 mcg by mouth daily before breakfast. 03/20/21  Yes Deboraha Sprang, MD  fish oil-omega-3 fatty acids 1000 MG capsule Take 1 g by mouth daily.   Yes [provider]  Magnesium Oxide 400 MG CAPS Take 400 mg by mouth daily.   Yes [provider]  Multiple Vitamin (MULTIVITAMIN) tablet Take 1 tablet by mouth daily.   Yes [provider]  oxymetazoline (AFRIN) 0.05 % nasal spray Place 1 spray into both nostrils 2 (two) times daily as needed for congestion.   Yes [provider]   rosuvastatin (CRESTOR) 20 MG tablet Take 1 tablet by mouth once daily 11/19/20  Yes Deboraha Sprang, MD  acetaminophen (TYLENOL) 325 MG tablet Take 2 tablets (650 mg total) by mouth every 4 (four) hours as needed for headache or mild pain. Patient not taking: Reported on 08/24/2021 02/14/21   Shirley Friar, PA-C      Allergies    Contrast media [iodinated contrast media]    Review of Systems   Review of Systems  Cardiovascular:  Negative for syncope.  Gastrointestinal:  Positive for nausea and vomiting.  Neurological:  Positive for dizziness and weakness.   Physical Exam Updated Vital Signs BP 117/65    Pulse 70    Temp 97.9 F (36.6 C) (Oral)    Resp 20    Ht 5\' 7"  (1.702 m)    Wt 74.8 kg    SpO2 96%    BMI 25.84 kg/m  Physical Exam Vitals and nursing note reviewed.  Constitutional:      Appearance: He is well-developed.  HENT:     Head: Normocephalic and atraumatic.     Mouth/Throat:     Mouth: Mucous membranes are moist.     Pharynx: Oropharynx is clear.  Eyes:     Pupils: Pupils are equal, round, and reactive to light.  Cardiovascular:     Rate and Rhythm: Normal rate.  Pulmonary:     Effort: Pulmonary effort is  normal. No respiratory distress.  Abdominal:     General: Abdomen is flat. There is no distension.  Musculoskeletal:        General: No swelling or tenderness. Normal range of motion.     Cervical back: Normal range of motion.  Skin:    General: Skin is warm and dry.     Coloration: Skin is not jaundiced or pale.  Neurological:     General: No focal deficit present.     Mental Status: He is alert.    ED Results / Procedures / Treatments   Labs (all labs ordered are listed, but only abnormal results are displayed) Labs Reviewed  CBC WITH DIFFERENTIAL/PLATELET - Abnormal; Notable for the following components:      Result Value   RBC 4.19 (*)    Platelets 147 (*)    All other components within normal limits  COMPREHENSIVE METABOLIC PANEL -  Abnormal; Notable for the following components:   Calcium 8.7 (*)    Total Protein 5.6 (*)    Albumin 3.1 (*)    AST 57 (*)    ALT 74 (*)    All other components within normal limits  RESP PANEL BY RT-PCR (FLU A&B, COVID) ARPGX2  LIPASE, BLOOD  URINALYSIS, ROUTINE W REFLEX MICROSCOPIC    EKG None  Radiology No results found.  Procedures Procedures    Medications Ordered in ED Medications  oxyCODONE-acetaminophen (PERCOCET/ROXICET) 5-325 MG per tablet 1 tablet (1 tablet Oral Given 08/24/21 0456)  amiodarone (PACERONE) tablet 200 mg (has no administration in time range)  carvedilol (COREG) tablet 3.125 mg (has no administration in time range)  rosuvastatin (CRESTOR) tablet 20 mg (has no administration in time range)  levothyroxine (SYNTHROID) tablet 125 mcg (has no administration in time range)  magnesium oxide (MAG-OX) tablet 400 mg (has no administration in time range)  oxymetazoline (AFRIN) 0.05 % nasal spray 1 spray (has no administration in time range)  acetaminophen (TYLENOL) tablet 650 mg (has no administration in time range)    Or  acetaminophen (TYLENOL) suppository 650 mg (has no administration in time range)  polyethylene glycol (MIRALAX / GLYCOLAX) packet 17 g (has no administration in time range)  ondansetron (ZOFRAN) tablet 4 mg (has no administration in time range)    Or  ondansetron (ZOFRAN) injection 4 mg (has no administration in time range)  lactated ringers bolus 1,000 mL (0 mLs Intravenous Stopped 08/24/21 0259)  lactated ringers bolus 1,000 mL (0 mLs Intravenous Stopped 08/24/21 0404)    ED Course/ Medical Decision Making/ A&P                           Medical Decision Making Amount and/or Complexity of Data Reviewed Labs: ordered.  Risk Decision regarding hospitalization.   Reviewing the notes in epic and lab results from most recent oncology visit appears the patient either has very severe gastritis but more likely has some type of malignancy  causing worsening dysphagia, malnutrition, failure to thrive and 50 pound weight loss in the last few months.  Also with soft pressures but are responsive to fluids. Discussed with hospitalist for admission.  Consult for gastroenterology placed per institutional protocol.  Final Clinical Impression(s) / ED Diagnoses Final diagnoses:  Weakness  Malnutrition, unspecified type ([place])  Failure to thrive in adult    Rx / DC Orders ED Discharge Orders     None         Janari Gagner, Corene Cornea, MD 08/24/21 [phone]

## 2021-08-24 NOTE — Assessment & Plan Note (Signed)
·   Patient presenting with a several month history of epigastric pain that radiates to the back, early satiety, generalized weakness and unintentional weight loss  CT imaging revealing necrotic lymphadenopathy at the GE junction as well as near the head of the pancreas with markedly elevated CA 19-9 level  Concerned that patient is suffering from either gastric or pancreatic cancer  Case discussed with Drs. Ardis Hughs and Conde with gastroenterology who have graciously agreed to evaluate the patient today in consultation  Patient will likely require endoscopic work-up either via EUS or ERCP, will keep patient n.p.o. in the meantime  Providing patient with intravenous proton pump inhibitor as well as as needed analgesics for associated discomfort  Patient will need close coordination with oncology after biopsy has been acquired and diagnosis confirmed

## 2021-08-24 NOTE — Consult Note (Signed)
Referring Provider: Dr. Cyd Silence Primary Care Physician:  Baxter Hire, MD Primary Gastroenterologist:  Althia Forts  Reason for Consultation:  Dysphagia and weight loss  HPI: Jerry Farley is a 76 y.o. male with past medical history of coronary artery disease (PCI to 1st OM, last cath 01/2019) on Plavix, ICD placement (originally medtronic, St. Jude replacement 58/5277), systolic and diastolic congestive heart failure (Echo 07/2021 EF 20-25% with G1DD), hyperlipidemia , hypothyroidism and ventricular tachycardia status post RFCA 01/2021 who presented to [facility] emergency department with inability to tolerate oral intake.  He initially presented to [facility] earlier this week where CT scan was performed as follows:  CT of the chest/abdomen/pelvis with contrast:  IMPRESSION: Multiple lymph nodes are noted surrounding the gastroesophageal junction, extending towards the pancreatic head, involving the portacaval space and pre portal region as well as the intra-aortocaval and periaortic and right iliac chain. These demonstrate central decreased attenuation suspicious for necrosis. A definitive primary neoplasm is not identified on this exam. PET-CT and tissue sampling would be helpful.   Diverticulosis without diverticulitis.   Cholelithiasis.   Tree-in-bud changes within the lungs bilaterally likely related to atypical pneumonia.   He was discharged and followed up with oncology who ordered a PET scan.  Due to worsening and ongoing symptoms it is recommended that he come to the hospital at [facility] for expedited evaluation.  They tell me that he has been having a lot of nausea, every time he tries to eat or drink he gets very full and has heaving.  Taking very little by mouth.  Probably about a 40 pound weight loss over the past few months.  Symptoms have been much worse over the past few weeks.  Has been having pain in his upper abdomen that  radiates to his back.  Reports dysphagia to pills, etc. as well, but he expresses that that tends to occur more in his throat.  Normal CEA and markedly elevated CA 19-9 of 765.  No GI procedures seen in Epic, but I actually did not ask if he has ever seen GI in the past.  Past Medical History:  Diagnosis Date   6948-lead    Arthritis    "maybe a little"   CAD (coronary artery disease) July 2007   CFX stent (patent Sept '07)   Cardiac defibrillator CRT-medtronic 02/25/4234   Chronic systolic congestive heart failure ([place])    Complication of anesthesia    "problems waking up w/valium"   High cholesterol    Hypertension    Hypothyroidism    ICD (implantable cardiac defibrillator) in place 2006, 2013   Medtronic Protecta XT-DR CRT (01/2012)   NICM (nonischemic cardiomyopathy) ([place])    ejection fraction 40-45% by echo July 2012   Non-ischemic cardiomyopathy- EF 40-45% 2D 7/14 02/11/2012   Obesity (BMI 30-39.9) 08/22/2013   Ventricular tachycardia, non-sustained 2007   Amiodarone    Past Surgical History:  Procedure Laterality Date   BI-VENTRICULAR IMPLANTABLE CARDIOVERTER DEFIBRILLATOR UPGRADE N/A 02/10/2012   Procedure: BI-VENTRICULAR IMPLANTABLE CARDIOVERTER DEFIBRILLATOR UPGRADE;  Surgeon: Deboraha Sprang, MD;  Location: [facility] CATH LAB;  Service: Cardiovascular;  Laterality: N/A;   BIV ICD GENERATOR CHANGEOUT N/A 06/24/2018   Procedure: BIV ICD GENERATOR CHANGEOUT;  Surgeon: Deboraha Sprang, MD;  Location: [place] CV LAB;  Service: Cardiovascular;  Laterality: N/A;   CARDIAC CATHETERIZATION  11/30/2001   non-ischemic cardiomyopahty with high LV end-diastolic pressure; 36% distal L main stenosis, large osital stenosis 70%, 70%  Cfx stenosis (Dr. Domenic Moras)   CARDIAC CATHETERIZATION  9/07   CFX stent patent [facility])   CARDIAC DEFIBRILLATOR PLACEMENT  09/2004; 02/10/12   ICD implanted in 2006 (Dr. Rito Ehrlich); EOL generator change in 01/2012 - CRT (BiV ICD) (Dr. Caryl Comes)   CORONARY ANGIOPLASTY  WITH STENT PLACEMENT   01/2006   CFX    LEFT HEART CATH AND CORONARY ANGIOGRAPHY N/A 04/13/2019   Procedure: LEFT HEART CATH AND CORONARY ANGIOGRAPHY;  Surgeon: Jettie Booze, MD;  Location: [place] CV LAB;  Service: Cardiovascular;  Laterality: N/A;   NM MYOCAR PERF EJECTION FRACTION  05/2010   dipyridamole myoview; mod perfusion defect in apical, basal inferior, mid inferior & apical inferior regions (infarct/scar); post-stress 28%, low risk scan   TONSILLECTOMY AND ADENOIDECTOMY     "when I was a child"   TRANSTHORACIC ECHOCARDIOGRAM  01/2013   EF 40-45%, LV mildly dilated, mild LVH, mod hypokinesis of inferior myocardium, grade 1 diastolic dysfunction; mild MR   V TACH ABLATION N/A 02/13/2021   Procedure: V TACH ABLATION;  Surgeon: Evans Lance, MD;  Location: [place] CV LAB;  Service: Cardiovascular;  Laterality: N/A;    Prior to Admission medications   Medication Sig Start Date End Date Taking? Authorizing Provider  acetaminophen (TYLENOL) 500 MG tablet Take 500 mg by mouth every 6 (six) hours as needed for headache or moderate pain.   Yes [provider]  amiodarone (PACERONE) 200 MG tablet Take 1 tablet (200 mg total) by mouth daily. 07/24/21  Yes Baldwin Jamaica, PA-C  aspirin 81 MG tablet Take 81 mg by mouth daily.   Yes [provider]  carvedilol (COREG) 3.125 MG tablet Take 1 tablet (3.125 mg total) by mouth 2 (two) times daily. 08/22/21  Yes Deboraha Sprang, MD  clopidogrel (PLAVIX) 75 MG tablet Take 1 tablet by mouth once daily 05/20/21  Yes Deboraha Sprang, MD  EUTHYROX 125 MCG tablet TAKE 1 TABLET BY MOUTH ONCE DAILY BEFORE BREAKFAST Patient taking differently: Take 125 mcg by mouth daily before breakfast. 03/20/21  Yes Deboraha Sprang, MD  fish oil-omega-3 fatty acids 1000 MG capsule Take 1 g by mouth daily.   Yes [provider]  Magnesium Oxide 400 MG CAPS Take 400 mg by mouth daily.   Yes [provider]  Multiple Vitamin  (MULTIVITAMIN) tablet Take 1 tablet by mouth daily.   Yes [provider]  oxymetazoline (AFRIN) 0.05 % nasal spray Place 1 spray into both nostrils 2 (two) times daily as needed for congestion.   Yes [provider]  rosuvastatin (CRESTOR) 20 MG tablet Take 1 tablet by mouth once daily 11/19/20  Yes Deboraha Sprang, MD  acetaminophen (TYLENOL) 325 MG tablet Take 2 tablets (650 mg total) by mouth every 4 (four) hours as needed for headache or mild pain. Patient not taking: Reported on 08/24/2021 02/14/21   Shirley Friar, PA-C    Current Facility-Administered Medications  Medication Dose Route Frequency Provider Last Rate Last Admin   0.9 %  sodium chloride infusion   Intravenous Continuous Gwynne Edinger, MD 75 mL/hr at 08/24/21 0817 New Bag at 08/24/21 0817   acetaminophen (TYLENOL) tablet 650 mg  650 mg Oral Q6H PRN Vernelle Emerald, MD       Or   acetaminophen (TYLENOL) suppository 650 mg  650 mg Rectal Q6H PRN Shalhoub, Sherryll Burger, MD       amiodarone (PACERONE) tablet 200 mg  200 mg Oral  Daily Shalhoub, Sherryll Burger, MD       aspirin EC tablet 81 mg  81 mg Oral Daily Wouk, Ailene Rud, MD       carvedilol (COREG) tablet 3.125 mg  3.125 mg Oral BID Vernelle Emerald, MD       levothyroxine (SYNTHROID) tablet 125 mcg  125 mcg Oral Q0600 Vernelle Emerald, MD       magnesium oxide (MAG-OX) tablet 400 mg  400 mg Oral Daily Shalhoub, Sherryll Burger, MD       ondansetron [facility]) tablet 4 mg  4 mg Oral Q6H PRN Shalhoub, Sherryll Burger, MD       Or   ondansetron [facility]) injection 4 mg  4 mg Intravenous Q6H PRN Shalhoub, Sherryll Burger, MD       oxyCODONE-acetaminophen (PERCOCET/ROXICET) 5-325 MG per tablet 1 tablet  1 tablet Oral Q4H PRN Shalhoub, Sherryll Burger, MD   1 tablet at 08/24/21 0456   oxymetazoline (AFRIN) 0.05 % nasal spray 1 spray  1 spray Each Nare BID PRN Vernelle Emerald, MD       pantoprazole (PROTONIX) injection 40 mg  40 mg Intravenous Daily Shalhoub, Sherryll Burger, MD        polyethylene glycol (MIRALAX / GLYCOLAX) packet 17 g  17 g Oral Daily PRN Shalhoub, Sherryll Burger, MD       rosuvastatin (CRESTOR) tablet 20 mg  20 mg Oral Daily Shalhoub, Sherryll Burger, MD        Allergies as of 08/24/2021 - Review Complete 08/24/2021  Allergen Reaction Noted   Contrast media [iodinated contrast media] Other (See Comments) 04/12/2019    Family History  Problem Relation Age of Onset   Cancer Mother    Cancer Brother     Social History   Socioeconomic History   Marital status: Married    Spouse name: Not on file   Number of children: Not on file   Years of education: Not on file   Highest education level: Not on file  Occupational History   Occupation: retired -- [employer]  Tobacco Use   Smoking status: Former    Packs/day: 1.50    Years: 42.00    Pack years: 63.00    Types: Cigarettes    Quit date: 07/28/2003    Years since quitting: 18.0   Smokeless tobacco: Former    Types: Nurse, children's Use: Never used  Substance and Sexual Activity   Alcohol use: No   Drug use: No   Sexual activity: Never  Other Topics Concern   Not on file  Social History Narrative   Not on file   Social Determinants of Health   Financial Resource Strain: Not on file  Food Insecurity: Not on file  Transportation Needs: Not on file  Physical Activity: Not on file  Stress: Not on file  Social Connections: Not on file  Intimate Partner Violence: Not on file    Review of Systems: ROS is O/W negative except as mentioned in HPI.  Physical Exam: Vital signs in last 24 hours: Temp:  [97.9 F (36.6 C)] 97.9 F (36.6 C) (01/29 0123) Pulse Rate:  [70-79] 70 (01/29 0400) Resp:  [15-29] 20 (01/29 0400) BP: (88-119)/(50-65) 117/65 (01/29 0400) SpO2:  [96 %-99 %] 96 % (01/29 0400) Weight:  [74.8 kg] 74.8 kg (01/29 0124)   General:  Alert but sleepy, chronically ill-appearing, ooperative in NAD Head:  Normocephalic and atraumatic. Eyes:  Sclera clear, no icterus.  Conjunctiva pink. Ears:  Normal auditory acuity. Mouth:  No deformity or lesions.   Lungs:  Clear throughout to auscultation.  No wheezes, crackles, or rhonchi.  Heart:  Regular rate and rhythm; no murmurs, clicks, rubs, or gallops. Abdomen:  Soft, non-distended.  BS present.  Non-tender. Msk:  Symmetrical without gross deformities.  Pulses:  Normal pulses noted. Extremities:  Without clubbing or edema. Neurologic:  Alert and oriented x 4;  grossly normal neurologically. Skin:  Intact without significant lesions or rashes. Psych:  Alert and cooperative. Normal mood and affect.  Intake/Output from previous day: 01/28 0701 - 01/29 0700 In: 2175 [I.V.:175; IV Piggyback:2000] Out: 200 [Urine:200]  Lab Results: Recent Labs    08/22/21 1912 08/24/21 0212  WBC 8.5 8.2  HGB 13.7 13.6  HCT 41.2 39.6  PLT 189 147*   BMET Recent Labs    08/22/21 1912 08/24/21 0212  NA 136 137  K 5.3* 4.6  CL 100 103  CO2 29 26  GLUCOSE 116* 96  BUN 11 10  CREATININE 1.07 0.96  CALCIUM 9.1 8.7*   LFT Recent Labs    08/24/21 0212  PROT 5.6*  ALBUMIN 3.1*  AST 57*  ALT 74*  ALKPHOS 55  BILITOT 0.7   IMPRESSION:  *Dysphagia, nausea, inability to take PO and weight loss of 40 pounds: CT scan showing multiple lymph nodes surrounding the GE junction extending towards pancreatic head and involving portacaval space and preportal region as well as a intra aortocaval and periaortic and right iliac chain with suspicion for necrosis.  Primary lesion not identified.  Question GE junction malignancy versus proximal gastric cancer versus other. *History of CAD on Plavix:  Last dose 1/28 AM.   PLAN: -Will need EGD at some point.  Timing TBD due to plavix and may need extensive biopsies.  I have placed him on clear liquids for now and will place him NPO after midnight in case our team is comfortable with EGD for tomorrow, but they may not be.   Laban Emperor. Kell Ferris  08/24/2021, 8:56 AM

## 2021-08-24 NOTE — ED Triage Notes (Signed)
Pt arrives POV from home. Per wife at bedside, pt had a procedure in July to remove scar tissue from his heart and has progressively worsened over time. Per pt's wife, pt is not eating well and has been unable to "hold food down." Pt states that he is currently in no acute pain.

## 2021-08-24 NOTE — H&P (Addendum)
History and Physical    Jerry Farley:[id number] DOB: 1946/06/22 DOA: 08/24/2021  PCP: Baxter Hire, MD  Patient coming from: Home   Chief Complaint:  Chief Complaint  Patient presents with   Weakness   Nausea     HPI:    76 year old male with past medical history of coronary artery disease (PCI to 1st OM, last cath 01/2019), ICD placement (originally medtronic, St. Jude replacement 03/3234), systolic and diastolic congestive heart failure (Echo 07/2021 EF 20-25% with G1DD), hyperlipidemia , hypothyroidism and ventricular tachycardia status post RFCA 01/2021 who presents to [facility] emergency department with inability to tolerate oral intake.  Patient explains that for the past several months he has been experiencing difficulty with tolerating oral intake.  Patient complains of associated nausea and lack of appetite as well as ongoing vague abdominal and mid back discomfort that he has been treating with as needed Tylenol.  Patient seems to attribute the onset of these progressive symptoms to when he was hospitalized in July 2022 for radiofrequency catheter ablation for ventricular tachycardia.  As the patient's symptoms have persisted over the past several months patient has experienced ongoing unintentional weight loss in excess of 40 pounds and progressively worsening generalized weakness with difficulty ambulating.  Patient denies fevers, chest pain, abdominal pain, diarrhea, dysuria, recent travel or sick contacts.  As patient is progressively lost weight patient's outpatient cardiologist has been reducing or discontinuing multiple medications due to progressively worsening hypotension presumably secondary to weight loss.  Patient eventually presented to [facility] emergency department 1/19 for evaluation.    During that emergency department stay CT imaging the abdomen and pelvis was performed with contrast revealing multiple enlarged lymph nodes surrounding the  gastroesophageal junction extending towards the pancreatic head with central decreased attenuation suspicious for necrosis.  ER provider at the time arrange for patient to follow-up as an outpatient with oncology whom the patient saw on 1/23.  At that time a PET scan was recommended which has been ordered but has yet to be completed.  Furthermore tumor markers were also ordered revealing normal CEA and markedly elevated CA 19-9 of 765.  Despite this outpatient work-up due to patient's worsening symptoms patient was encouraged by his cardiologist to return to the emergency department for further expedited work-up    Review of Systems:   Review of Systems  Constitutional:  Positive for malaise/fatigue and weight loss.  Gastrointestinal:  Positive for abdominal pain.  Musculoskeletal:  Positive for back pain.  Neurological:  Positive for weakness.  All other systems reviewed and are negative.  Past Medical History:  Diagnosis Date   6948-lead    Arthritis    "maybe a little"   CAD (coronary artery disease) July 2007   CFX stent (patent Sept '07)   Cardiac defibrillator CRT-medtronic 12/01/3218   Chronic systolic congestive heart failure (HCC)    Complication of anesthesia    "problems waking up w/valium"   High cholesterol    Hypertension    Hypothyroidism    ICD (implantable cardiac defibrillator) in place 2006, 2013   Medtronic Protecta XT-DR CRT (01/2012)   NICM (nonischemic cardiomyopathy) ([place])    ejection fraction 40-45% by echo July 2012   Non-ischemic cardiomyopathy- EF 40-45% 2D 7/14 02/11/2012   Obesity (BMI 30-39.9) 08/22/2013   Ventricular tachycardia, non-sustained 2007   Amiodarone    Past Surgical History:  Procedure Laterality Date   BI-VENTRICULAR IMPLANTABLE CARDIOVERTER DEFIBRILLATOR UPGRADE N/A 02/10/2012   Procedure: BI-VENTRICULAR IMPLANTABLE CARDIOVERTER DEFIBRILLATOR UPGRADE;  Surgeon: Deboraha Sprang, MD;  Location: [facility] CATH LAB;  Service: Cardiovascular;   Laterality: N/A;   BIV ICD GENERATOR CHANGEOUT N/A 06/24/2018   Procedure: BIV ICD GENERATOR CHANGEOUT;  Surgeon: Deboraha Sprang, MD;  Location: [place] CV LAB;  Service: Cardiovascular;  Laterality: N/A;   CARDIAC CATHETERIZATION  11/30/2001   non-ischemic cardiomyopahty with high LV end-diastolic pressure; 10% distal L main stenosis, large osital stenosis 70%, 70% Cfx stenosis (Dr. Domenic Moras)   CARDIAC CATHETERIZATION  9/07   CFX stent patent [facility])   CARDIAC DEFIBRILLATOR PLACEMENT  09/2004; 02/10/12   ICD implanted in 2006 (Dr. Rito Ehrlich); EOL generator change in 01/2012 - CRT (BiV ICD) (Dr. Caryl Comes)   [place]   01/2006   CFX    LEFT HEART CATH AND CORONARY ANGIOGRAPHY N/A 04/13/2019   Procedure: LEFT HEART CATH AND CORONARY ANGIOGRAPHY;  Surgeon: Jettie Booze, MD;  Location: [place] CV LAB;  Service: Cardiovascular;  Laterality: N/A;   NM MYOCAR PERF EJECTION FRACTION  05/2010   dipyridamole myoview; mod perfusion defect in apical, basal inferior, mid inferior & apical inferior regions (infarct/scar); post-stress 28%, low risk scan   TONSILLECTOMY AND ADENOIDECTOMY     "when I was a child"   TRANSTHORACIC ECHOCARDIOGRAM  01/2013   EF 40-45%, LV mildly dilated, mild LVH, mod hypokinesis of inferior myocardium, grade 1 diastolic dysfunction; mild MR   V TACH ABLATION N/A 02/13/2021   Procedure: V TACH ABLATION;  Surgeon: Evans Lance, MD;  Location: [place] CV LAB;  Service: Cardiovascular;  Laterality: N/A;     reports that he quit smoking about 18 years ago. His smoking use included cigarettes. He has a 63.00 pack-year smoking history. He has quit using smokeless tobacco.  His smokeless tobacco use included chew. He reports that he does not drink alcohol and does not use drugs.  Allergies  Allergen Reactions   Contrast Media [Iodinated Contrast Media] Other (See Comments)    Patient states "I don't remember exactly what happens but I  know I cannot have contrast for testing."     Family History  Problem Relation Age of Onset   Cancer Mother    Cancer Brother      Prior to Admission medications   Medication Sig Start Date End Date Taking? Authorizing Provider  acetaminophen (TYLENOL) 500 MG tablet Take 500 mg by mouth every 6 (six) hours as needed for headache or moderate pain.   Yes [provider]  amiodarone (PACERONE) 200 MG tablet Take 1 tablet (200 mg total) by mouth daily. 07/24/21  Yes Baldwin Jamaica, PA-C  aspirin 81 MG tablet Take 81 mg by mouth daily.   Yes [provider]  carvedilol (COREG) 3.125 MG tablet Take 1 tablet (3.125 mg total) by mouth 2 (two) times daily. 08/22/21  Yes Deboraha Sprang, MD  clopidogrel (PLAVIX) 75 MG tablet Take 1 tablet by mouth once daily 05/20/21  Yes Deboraha Sprang, MD  EUTHYROX 125 MCG tablet TAKE 1 TABLET BY MOUTH ONCE DAILY BEFORE BREAKFAST Patient taking differently: Take 125 mcg by mouth daily before breakfast. 03/20/21  Yes Deboraha Sprang, MD  fish oil-omega-3 fatty acids 1000 MG capsule Take 1 g by mouth daily.   Yes [provider]  Magnesium Oxide 400 MG CAPS Take 400 mg by mouth daily.   Yes [provider]  Multiple Vitamin (MULTIVITAMIN) tablet Take 1 tablet by mouth daily.   Yes [provider]  oxymetazoline (AFRIN) 0.05 % nasal spray Place 1 spray into both nostrils 2 (two) times daily as needed for congestion.   Yes [provider]  rosuvastatin (CRESTOR) 20 MG tablet Take 1 tablet by mouth once daily 11/19/20  Yes Deboraha Sprang, MD  acetaminophen (TYLENOL) 325 MG tablet Take 2 tablets (650 mg total) by mouth every 4 (four) hours as needed for headache or mild pain. Patient not taking: Reported on 08/24/2021 02/14/21   Shirley Friar, PA-C    Physical Exam: Vitals:   08/24/21 0230 08/24/21 0300 08/24/21 0330 08/24/21 0400  BP: (!) 103/50 (!) 111/58 119/65 117/65  Pulse: 75 73 77 70  Resp: (!)  29 (!) 25 (!) 29 20  Temp:      TempSrc:      SpO2: 97% 97% 96% 96%  Weight:      Height:        Constitutional: Awake alert and oriented x3, no associated distress.   Skin: no rashes, no lesions, poor skin turgor noted. Eyes: Pupils are equally reactive to light.  No evidence of scleral icterus or conjunctival pallor.  ENMT: Moist mucous membranes noted.  Posterior pharynx clear of any exudate or lesions.   Neck: normal, supple, no masses, no thyromegaly.  No evidence of jugular venous distension.   Respiratory: clear to auscultation bilaterally, no wheezing, no crackles. Normal respiratory effort. No accessory muscle use.  Cardiovascular: Regular rate and rhythm, no murmurs / rubs / gallops. No extremity edema. 2+ pedal pulses. No carotid bruits.  Chest:   Nontender without crepitus or deformity.   Back:   Nontender without crepitus or deformity. Abdomen: Mild epigastric tenderness noted.  Abdomen is soft.  No evidence of intra-abdominal masses.  Positive bowel sounds noted in all quadrants.   Musculoskeletal: No joint deformity upper and lower extremities. Good ROM, no contractures. Normal muscle tone.  Neurologic: CN 2-12 grossly intact. Sensation intact.  Patient moving all 4 extremities spontaneously.  Patient is following all commands.  Patient is responsive to verbal stimuli.   Psychiatric: Patient exhibits normal mood with appropriate affect.  Patient seems to possess insight as to their current situation.     Labs on Admission: I have personally reviewed following labs and imaging studies -   CBC: Recent Labs  Lab 08/18/21 1005 08/22/21 1912 08/24/21 0212  WBC 9.5 8.5 8.2  NEUTROABS 7.0  --  5.7  HGB 14.4 13.7 13.6  HCT 41.7 41.2 39.6  MCV 95.2 96.7 94.5  PLT 177 189 976*   Basic Metabolic Panel: Recent Labs  Lab 08/18/21 1005 08/22/21 1912 08/24/21 0212  NA 137 136 137  K 5.2* 5.3* 4.6  CL 100 100 103  CO2 27 29 26   GLUCOSE 117* 116* 96  BUN 14 11 10    CREATININE 1.16 1.07 0.96  CALCIUM 9.5 9.1 8.7*   GFR: Estimated Creatinine Clearance: 62.2 mL/min (by C-G formula based on SCr of 0.96 mg/dL). Liver Function Tests: Recent Labs  Lab 08/18/21 1005 08/22/21 1912 08/24/21 0212  AST 72* 66* 57*  ALT 89* 83* 74*  ALKPHOS 55 53 55  BILITOT 0.8 0.9 0.7  PROT 7.0 6.2* 5.6*  ALBUMIN 3.9 3.3* 3.1*   Recent Labs  Lab 08/22/21 1912 08/24/21 0212  LIPASE 31 29   No results for input(s): AMMONIA in the last 168 hours. Coagulation Profile: No results for input(s): INR, PROTIME in the last 168 hours. Cardiac Enzymes: No results for input(s): CKTOTAL, CKMB, CKMBINDEX, TROPONINI in the  last 168 hours. BNP (last 3 results) No results for input(s): PROBNP in the last 8760 hours. HbA1C: No results for input(s): HGBA1C in the last 72 hours. CBG: No results for input(s): GLUCAP in the last 168 hours. Lipid Profile: No results for input(s): CHOL, HDL, LDLCALC, TRIG, CHOLHDL, LDLDIRECT in the last 72 hours. Thyroid Function Tests: No results for input(s): TSH, T4TOTAL, FREET4, T3FREE, THYROIDAB in the last 72 hours. Anemia Panel: No results for input(s): VITAMINB12, FOLATE, FERRITIN, TIBC, IRON, RETICCTPCT in the last 72 hours. Urine analysis:    Component Value Date/Time   COLORURINE YELLOW 08/24/2021 0529   APPEARANCEUR CLEAR 08/24/2021 0529   LABSPEC 1.014 08/24/2021 0529   PHURINE 6.0 08/24/2021 0529   GLUCOSEU NEGATIVE 08/24/2021 0529   HGBUR NEGATIVE 08/24/2021 0529   BILIRUBINUR NEGATIVE 08/24/2021 0529   KETONESUR 5 (A) 08/24/2021 0529   PROTEINUR NEGATIVE 08/24/2021 0529   NITRITE NEGATIVE 08/24/2021 0529   LEUKOCYTESUR NEGATIVE 08/24/2021 0529    Radiological Exams on Admission - Personally Reviewed: No results found.  EKG: Personally reviewed.  Rhythm is atrial fibrillation with evidence of pacemaker spikes with heart rate of 69 and Qtc of 568.    Assessment/Plan  * Unintentional weight loss- (present on  admission) Patient presenting with a several month history of epigastric pain that radiates to the back, early satiety, generalized weakness and unintentional weight loss CT imaging revealing necrotic lymphadenopathy at the GE junction as well as near the head of the pancreas with markedly elevated CA 19-9 level Concerned that patient is suffering from either gastric or pancreatic cancer Case discussed with Drs. Ardis Hughs and Tarri Glenn with gastroenterology who have graciously agreed to evaluate the patient today in consultation Patient will likely require endoscopic work-up either via EUS or ERCP, will keep patient n.p.o. in the meantime Providing patient with intravenous proton pump inhibitor as well as as needed analgesics for associated discomfort Patient will need close coordination with oncology after biopsy has been acquired and diagnosis confirmed  Epigastric pain- (present on admission) Please see assessment and plan above  Elevated CA 19-9 level- (present on admission) Please see assessment and plan above  Chronic combined systolic and diastolic congestive heart failure ([place])- (present on admission) No clinical evidence of cardiogenic volume overload -if patient does exhibit any symptoms related to CHF will consider discussing with advanced heart failure team while here Recent echocardiogram performed in early January revealing downtrending ejection fraction Continuing home regimen of amiodarone and newly reduced regimen of Coreg    Coronary artery disease involving native coronary artery of native heart without angina pectoris- (present on admission) Patient is currently chest pain free Monitoring patient on telemetry Holding home regimen of antiplatelet therapy for now Continue home regimen of Crestor   Mixed hyperlipidemia- (present on admission) Continuing home regimen of lipid lowering therapy.   Hypothyroidism Resume home regimen of Synthroid        Code Status:   Full code  code status decision has been confirmed with: patient and wife Family Communication: Wife is at bedside who has been updated on plan of care.  Status is: Observation  The patient remains OBS appropriate and will d/c before 2 midnights.       Vernelle Emerald MD [facility] Pager [phone]  If 7PM-7AM, please contact night-coverage [email or web address] Use universal [place] password for that web site. If you do not have the password, please call the hospital operator.  08/24/2021, 7:24 AM

## 2021-08-24 NOTE — Progress Notes (Addendum)
New Admission Note:  Arrival Method: By stretcher from ED Mental Orientation: Alert and oriented x 4 Telemetry: Box 3, CCMD notified Assessment: Completed Skin: Completed, refer to flowsheets IV: L upper arm Pain: 10/10 back pain Tubes: None Safety Measures: Safety Fall Prevention Plan was given, discussed and signed. Admission: Completed [facility] Orientation: Patient has been orientated to the room, unit and the staff. Family: Wife at bedside  Orders have been reviewed and implemented. Will continue to monitor the patient. Call light has been placed within reach and bed alarm has been activated.   Fara Olden, RN  Phone Number: [phone]

## 2021-08-24 NOTE — Assessment & Plan Note (Signed)
.   Continuing home regimen of lipid lowering therapy.  

## 2021-08-24 NOTE — Progress Notes (Addendum)
Pt just arrived to the floor and is c/o back pain at 10/10, no prn orders in chart. Also need a diet order. RN messaged MD on call.   [place]   Reviewed chart and no note from admitting physician yet.  Dr. Cyd Silence assigned to admit pt and will be writing orders and H&P. RN notified

## 2021-08-24 NOTE — Assessment & Plan Note (Signed)
·   Please see assessment and plan above °

## 2021-08-24 NOTE — Plan of Care (Signed)
  Problem: Education: Goal: Knowledge of General Education information will improve Description: Including pain rating scale, medication(s)/side effects and non-pharmacologic comfort measures Outcome: Progressing   Problem: Clinical Measurements: Goal: Diagnostic test results will improve Outcome: Progressing   Problem: Activity: Goal: Risk for activity intolerance will decrease Outcome: Progressing   Problem: Pain Managment: Goal: General experience of comfort will improve Outcome: Progressing   

## 2021-08-24 NOTE — Assessment & Plan Note (Signed)
•   No clinical evidence of cardiogenic volume overload -if patient does exhibit any symptoms related to CHF will consider discussing with advanced heart failure team while here  Recent echocardiogram performed in early January revealing downtrending ejection fraction  Continuing home regimen of amiodarone and newly reduced regimen of Coreg

## 2021-08-24 NOTE — Assessment & Plan Note (Signed)
.   Resume home regimen of Synthroid 

## 2021-08-24 NOTE — Progress Notes (Addendum)
Interim progress note not for billing  Briefly, patient is a 76M hx hfref, CAD, has defibrilator, who has experienced several months vague generalized abdominal pain, decreased appetite, and early satiety. Recent CT shows multiple lymph nodes in the abdominal and mediastinal space. Ca 19-9 level elevated. Recently saw oncology who advised PET scan to further evaluate. Now admitted for expedited work-up. Initial labs showed mild hyperkalemia that has resolved with IVF. Does not appear to be in heart failure exacerbation. Admitting provider spoke with GI who will see today, likely will need EUS vs ERCP. Thus will maintain NPO for now. Home meds have been continued though I will continue to hold home plavix.

## 2021-08-24 NOTE — Assessment & Plan Note (Addendum)
•   Patient is currently chest pain free  Monitoring patient on telemetry  Holding home regimen of antiplatelet therapy for now  Continue home regimen of Crestor

## 2021-08-25 DIAGNOSIS — R970 Elevated carcinoembryonic antigen [CEA]: Secondary | ICD-10-CM | POA: Diagnosis present

## 2021-08-25 DIAGNOSIS — K8020 Calculus of gallbladder without cholecystitis without obstruction: Secondary | ICD-10-CM | POA: Diagnosis present

## 2021-08-25 DIAGNOSIS — Z8673 Personal history of transient ischemic attack (TIA), and cerebral infarction without residual deficits: Secondary | ICD-10-CM | POA: Diagnosis not present

## 2021-08-25 DIAGNOSIS — Z95810 Presence of automatic (implantable) cardiac defibrillator: Secondary | ICD-10-CM | POA: Diagnosis not present

## 2021-08-25 DIAGNOSIS — Z20822 Contact with and (suspected) exposure to covid-19: Secondary | ICD-10-CM | POA: Diagnosis present

## 2021-08-25 DIAGNOSIS — R0600 Dyspnea, unspecified: Secondary | ICD-10-CM | POA: Diagnosis not present

## 2021-08-25 DIAGNOSIS — Z87891 Personal history of nicotine dependence: Secondary | ICD-10-CM | POA: Diagnosis not present

## 2021-08-25 DIAGNOSIS — C160 Malignant neoplasm of cardia: Secondary | ICD-10-CM | POA: Diagnosis present

## 2021-08-25 DIAGNOSIS — Z955 Presence of coronary angioplasty implant and graft: Secondary | ICD-10-CM | POA: Diagnosis not present

## 2021-08-25 DIAGNOSIS — D696 Thrombocytopenia, unspecified: Secondary | ICD-10-CM | POA: Diagnosis present

## 2021-08-25 DIAGNOSIS — R634 Abnormal weight loss: Secondary | ICD-10-CM | POA: Diagnosis present

## 2021-08-25 DIAGNOSIS — K5790 Diverticulosis of intestine, part unspecified, without perforation or abscess without bleeding: Secondary | ICD-10-CM | POA: Diagnosis present

## 2021-08-25 DIAGNOSIS — R6881 Early satiety: Secondary | ICD-10-CM | POA: Diagnosis present

## 2021-08-25 DIAGNOSIS — E039 Hypothyroidism, unspecified: Secondary | ICD-10-CM | POA: Diagnosis present

## 2021-08-25 DIAGNOSIS — I428 Other cardiomyopathies: Secondary | ICD-10-CM | POA: Diagnosis present

## 2021-08-25 DIAGNOSIS — D490 Neoplasm of unspecified behavior of digestive system: Secondary | ICD-10-CM | POA: Diagnosis not present

## 2021-08-25 DIAGNOSIS — R1310 Dysphagia, unspecified: Secondary | ICD-10-CM | POA: Diagnosis not present

## 2021-08-25 DIAGNOSIS — D649 Anemia, unspecified: Secondary | ICD-10-CM | POA: Diagnosis present

## 2021-08-25 DIAGNOSIS — I472 Ventricular tachycardia, unspecified: Secondary | ICD-10-CM | POA: Diagnosis present

## 2021-08-25 DIAGNOSIS — I5042 Chronic combined systolic (congestive) and diastolic (congestive) heart failure: Secondary | ICD-10-CM | POA: Diagnosis present

## 2021-08-25 DIAGNOSIS — R627 Adult failure to thrive: Secondary | ICD-10-CM | POA: Diagnosis present

## 2021-08-25 DIAGNOSIS — R110 Nausea: Secondary | ICD-10-CM | POA: Diagnosis not present

## 2021-08-25 DIAGNOSIS — E43 Unspecified severe protein-calorie malnutrition: Secondary | ICD-10-CM | POA: Diagnosis present

## 2021-08-25 DIAGNOSIS — I2510 Atherosclerotic heart disease of native coronary artery without angina pectoris: Secondary | ICD-10-CM | POA: Diagnosis present

## 2021-08-25 DIAGNOSIS — E782 Mixed hyperlipidemia: Secondary | ICD-10-CM | POA: Diagnosis present

## 2021-08-25 DIAGNOSIS — Z803 Family history of malignant neoplasm of breast: Secondary | ICD-10-CM | POA: Diagnosis not present

## 2021-08-25 DIAGNOSIS — I110 Hypertensive heart disease with heart failure: Secondary | ICD-10-CM | POA: Diagnosis present

## 2021-08-25 DIAGNOSIS — T18128A Food in esophagus causing other injury, initial encounter: Secondary | ICD-10-CM | POA: Diagnosis not present

## 2021-08-25 DIAGNOSIS — R1314 Dysphagia, pharyngoesophageal phase: Secondary | ICD-10-CM | POA: Diagnosis present

## 2021-08-25 LAB — CBC WITH DIFFERENTIAL/PLATELET
Abs Immature Granulocytes: 0.04 10*3/uL (ref 0.00–0.07)
Basophils Absolute: 0 10*3/uL (ref 0.0–0.1)
Basophils Relative: 0 %
Eosinophils Absolute: 0.3 10*3/uL (ref 0.0–0.5)
Eosinophils Relative: 3 %
HCT: 34 % — ABNORMAL LOW (ref 39.0–52.0)
Hemoglobin: 11.6 g/dL — ABNORMAL LOW (ref 13.0–17.0)
Immature Granulocytes: 1 %
Lymphocytes Relative: 14 %
Lymphs Abs: 1.1 10*3/uL (ref 0.7–4.0)
MCH: 32.5 pg (ref 26.0–34.0)
MCHC: 34.1 g/dL (ref 30.0–36.0)
MCV: 95.2 fL (ref 80.0–100.0)
Monocytes Absolute: 0.7 10*3/uL (ref 0.1–1.0)
Monocytes Relative: 9 %
Neutro Abs: 5.6 10*3/uL (ref 1.7–7.7)
Neutrophils Relative %: 73 %
Platelets: 136 10*3/uL — ABNORMAL LOW (ref 150–400)
RBC: 3.57 MIL/uL — ABNORMAL LOW (ref 4.22–5.81)
RDW: 13.5 % (ref 11.5–15.5)
WBC: 7.7 10*3/uL (ref 4.0–10.5)
nRBC: 0 % (ref 0.0–0.2)

## 2021-08-25 LAB — COMPREHENSIVE METABOLIC PANEL
ALT: 58 U/L — ABNORMAL HIGH (ref 0–44)
AST: 44 U/L — ABNORMAL HIGH (ref 15–41)
Albumin: 2.5 g/dL — ABNORMAL LOW (ref 3.5–5.0)
Alkaline Phosphatase: 44 U/L (ref 38–126)
Anion gap: 9 (ref 5–15)
BUN: 7 mg/dL — ABNORMAL LOW (ref 8–23)
CO2: 22 mmol/L (ref 22–32)
Calcium: 8 mg/dL — ABNORMAL LOW (ref 8.9–10.3)
Chloride: 106 mmol/L (ref 98–111)
Creatinine, Ser: 0.85 mg/dL (ref 0.61–1.24)
GFR, Estimated: >60 mL/min (ref >60)
Glucose, Bld: 78 mg/dL (ref 70–99)
Potassium: 3.8 mmol/L (ref 3.5–5.1)
Sodium: 137 mmol/L (ref 135–145)
Total Bilirubin: 0.7 mg/dL (ref 0.3–1.2)
Total Protein: 4.8 g/dL — ABNORMAL LOW (ref 6.5–8.1)

## 2021-08-25 LAB — MAGNESIUM: Magnesium: 2 mg/dL (ref 1.7–2.4)

## 2021-08-25 MED ORDER — ENSURE ENLIVE PO LIQD: 237.0000 mL | Freq: Two times a day (BID) | ORAL | Status: DC

## 2021-08-25 MED ORDER — ENSURE ENLIVE PO LIQD
237.0000 mL | Freq: Three times a day (TID) | ORAL | Status: DC
  Administered 2021-08-25: 237 mL via ORAL

## 2021-08-25 MED ORDER — ENOXAPARIN SODIUM 40 MG/0.4ML IJ SOSY
40.0000 mg | PREFILLED_SYRINGE | INTRAMUSCULAR | Status: DC
  Administered 2021-08-25 – 2021-08-31 (×7): 40 mg via SUBCUTANEOUS
  Filled 2021-08-25 (×8): qty 0.4

## 2021-08-25 NOTE — Progress Notes (Signed)
° ° °  Gastroenterology Inpatient Follow Up    Subjective: - Continues to have dysphagia and nausea. Does not have much of an appetite. Has mostly been taking in liquids.   Objective: Vital signs in last 24 hours: Temp:  [97.8 F (36.6 C)-98.3 F (36.8 C)] 97.8 F (36.6 C) (01/30 1639) Pulse Rate:  [69-78] 78 (01/30 1639) Resp:  [16-19] 19 (01/30 1639) BP: (82-105)/(46-69) 98/58 (01/30 1639) SpO2:  [93 %-100 %] 100 % (01/30 1639) Last BM Date: 08/24/21  Intake/Output from previous day: 01/29 0701 - 01/30 0700 In: 1305.7 [P.O.:840; I.V.:465.7] Out: 1100 [Urine:1100] Intake/Output this shift: Total I/O In: 600 [P.O.:600] Out: 300 [Urine:300]  General appearance: alert and cooperative Resp: clear to auscultation bilaterally Cardio: regular rate and rhythm, S1, S2 normal, no murmur, click, rub or gallop GI: soft, non-tender; bowel sounds normal; no masses,  no organomegaly Extremities: extremities normal, atraumatic, no cyanosis or edema  Lab Results: Recent Labs    08/22/21 1912 08/24/21 0212 08/25/21 0350  WBC 8.5 8.2 7.7  HGB 13.7 13.6 11.6*  HCT 41.2 39.6 34.0*  PLT 189 147* 136*   BMET Recent Labs    08/22/21 1912 08/24/21 0212 08/25/21 0350  NA 136 137 137  K 5.3* 4.6 3.8  CL 100 103 106  CO2 29 26 22   GLUCOSE 116* 96 78  BUN 11 10 7*  CREATININE 1.07 0.96 0.85  CALCIUM 9.1 8.7* 8.0*   LFT Recent Labs    08/25/21 0350  PROT 4.8*  ALBUMIN 2.5*  AST 44*  ALT 58*  ALKPHOS 44  BILITOT 0.7   PT/INR No results for input(s): LABPROT, INR in the last 72 hours. Hepatitis Panel No results for input(s): HEPBSAG, HCVAB, HEPAIGM, HEPBIGM in the last 72 hours. C-Diff No results for input(s): CDIFFTOX in the last 72 hours.  Studies/Results: No results found.  Medications: I have reviewed the patient's current medications. Scheduled:  amiodarone  200 mg Oral Daily   aspirin EC  81 mg Oral Daily   enoxaparin (LOVENOX) injection  40 mg Subcutaneous  Q24H   feeding supplement  237 mL Oral TID BM   levothyroxine  125 mcg Oral Q0600   magnesium oxide  400 mg Oral Daily   pantoprazole (PROTONIX) IV  40 mg Intravenous Daily   rosuvastatin  20 mg Oral Daily   Continuous: FHL:[id number] **OR** acetaminophen, ondansetron **OR** ondansetron (ZOFRAN) IV, oxyCODONE-acetaminophen, oxymetazoline, polyethylene glycol  Assessment/Plan: 52M w/hx of CAD s/p PCI on Plavix, HFrEF (EF 20-25%) s/p ICD, V-tach p/w weight loss and dysphagia. CT concerning for GE junction vs gastric cancer. Last dose of Plavix was 1/28.  - EGD 2/2 after 5 day Plavix washout   LOS: 0 days   Sharyn Creamer 08/25/2021, 6:39 PM

## 2021-08-25 NOTE — Plan of Care (Signed)
  Problem: Education: Goal: Knowledge of General Education information will improve Description Including pain rating scale, medication(s)/side effects and non-pharmacologic comfort measures Outcome: Progressing   

## 2021-08-25 NOTE — TOC Initial Note (Signed)
Transition of Care [facility]) - Initial/Assessment Note    Patient Details  Name: Jerry Farley MRN: [id number] Date of Birth: 11/01/1945  [facility] CM/SW Contact:    Tom-Johnson, Renea Ee, RN Phone Number: 08/25/2021, 2:41 PM  Clinical Narrative:                  CM spoke with patient at bedside about needs for post hospital transition. Admitted for Unintentional weight loss. Patient states he lives at home with his wife and son. States his health has been declining for months now and has lost over 40lbs. Does not have any DME's at home. PCP is Baxter Hire, MD and uses [facility]. No recommendations noted at this time. MD notified for PT eval for discharge disposition. CM will continue to follow with needs.   Barriers to Discharge: Continued Medical Work up   Patient Goals and CMS Choice Patient states their goals for this hospitalization and ongoing recovery are:: To return home CMS Medicare.gov Compare Post Acute Care list provided to:: Patient    Expected Discharge Plan and Services     Discharge Planning Services: CM Consult   Living arrangements for the past 2 months: Single Family Home                                      Prior Living Arrangements/Services Living arrangements for the past 2 months: Single Family Home Lives with:: Spouse, Adult Children (Son) Patient language and need for interpreter reviewed:: Yes Do you feel safe going back to the place where you live?: Yes      Need for Family Participation in Patient Care: Yes (Comment) Care giver support system in place?: Yes (comment)   Criminal Activity/Legal Involvement Pertinent to Current Situation/Hospitalization: No - Comment as needed  Activities of Daily Living      Permission Sought/Granted Permission sought to share information with : Case Manager, Family Supports, Customer service manager Permission granted to share information with :  Yes, Verbal Permission Granted              Emotional Assessment Appearance:: Appears stated age Attitude/Demeanor/Rapport: Engaged, Gracious Affect (typically observed): Accepting, Appropriate, Calm, Hopeful Orientation: : Oriented to Self, Oriented to Place, Oriented to  Time, Oriented to Situation Alcohol / Substance Use: Not Applicable Psych Involvement: No (comment)  Admission diagnosis:  Weakness [R53.1] Failure to thrive in adult [R62.7] Malnutrition, unspecified type ([place]) [E46] Patient Active Problem List   Diagnosis Date Noted   Failure to thrive in adult 08/25/2021   Unintentional weight loss 08/24/2021   Elevated CA 19-9 level 08/24/2021   Epigastric pain 08/24/2021   Mixed hyperlipidemia 08/24/2021   Hypothyroidism 08/24/2021   Abnormal CT scan, gastrointestinal tract    Esophageal dysphagia    Lymphadenopathy, abdominal 08/18/2021   Obesity (BMI 30-39.9) 08/22/2013   Non-ischemic cardiomyopathy- EF 40-45% 2D 7/14 02/11/2012   Ventricular tachycardia-polymorphic 01/26/2011   Cardiac defibrillator CRT-medtronic 01/26/2011   Coronary artery disease involving native coronary artery of native heart without angina pectoris    Chronic combined systolic and diastolic congestive heart failure ([place])    PCP:  Baxter Hire, MD Pharmacy:   [place], [place] - [address] [place] [address] Phone: [phone] Fax: [phone]     Social Determinants of Health (SDOH) Interventions  Readmission Risk Interventions No flowsheet data found.

## 2021-08-25 NOTE — Progress Notes (Signed)
PROGRESS NOTE    Jerry Farley  WER:[id number] DOB: [birth year] DOA: 08/24/2021 PCP: Baxter Hire, MD   Brief Narrative: 76 year old past medical history significant for CAD status post PCI cath 02/13/2019, ICD placement, systolic and diastolic heart failure ejection fraction 20 to 25% echo 1/95 grade 1 diastolic dysfunction, hypothyroidism, ventricular tachycardia status post RF CVA 01/2021 who presents to [facility], ED with inability to tolerate oral intake.  Patient has been experiencing difficulty tolerating oral intake over the last several month.  Associated nausea and lack of appetite, ongoing vague abdominal and mid back discomfort.  Report ongoing unintentional 40 pound weight loss.  Patient had a CT scan 1/19 at [facility] ED which revealed multiple enlarged lymph nodes surrounding the gastroesophageal junction extending toward the pancreatic head with central decreased at admission suspicious for necrosis.  Had arrangement to follow-up outpatient with oncology whom the patient saw 1/23 at that time PET scan was recommended which has been ordered but has not yet to be completed.  Tumor marker CEA: normal, markedly elevated CEA 19-9: 765.  Patient presented to the ED for further evaluation due to worsening symptoms.   Assessment & Plan:   Principal Problem:   Unintentional weight loss Active Problems:   Coronary artery disease involving native coronary artery of native heart without angina pectoris   Chronic combined systolic and diastolic congestive heart failure (HCC)   Elevated CA 19-9 level   Epigastric pain   Mixed hyperlipidemia   Hypothyroidism   Abnormal CT scan, gastrointestinal tract   Esophageal dysphagia  1-Unintentional Weight loss//Epigastric pain.  -Patient presented with several month history of epigastric pain radiating to the back, early satiety, generalized weakness and unintentional weight loss. -CT revealing necrotic lymphadenopathy at the GE junction as well  as near the head of the pancreas with markedly elevated CA 19-9 level -GI has been consulted plan for endoscopy after Plavix washout for 5 days -Patient continues to have poor oral intake. Hold IV fluids today to avoid pulmonary edema -Start Clear diet. Start Ensure.  -IV Protonix.  -Last dose of plavix was Saturday 1/28 per patient.   2-Chronic combined systolic and diastolic heart failure: Compensated Continue amiodarone and newly reduced regimen of Coreg ECHO; EF 20-25 % Hold IV fluids today, monitor volume status.   3-CAD, involving native arteries: -Chest pain-free -Holding Plavix in anticipation of endoscopy -Crestor  4-Mixed Hyperlipidemia: Continue with  5-Hypothyroidism: Continue with Synthroid  6-Hypotension;  Received fluids.  Recently stop spironolactone.  Carvedilol dose reduce to 3.1 out patient.  SBP in the 80--98. Holding carvedilol. If BP remain stable will resume tomorrow.   Mild transaminases;  Monitor.   Estimated body mass index is 25.84 kg/m as calculated from the following:   Height as of this encounter: 5\' 7"  (1.702 m).   Weight as of this encounter: 74.8 kg.   DVT prophylaxis: Lovenox, hold prior to procedure.  Code Status: Full code Family Communication: Care discussed with patient.  Disposition Plan:  Status is: Inpatient.   The patient will require care spanning > 2 midnights and should be moved to inpatient because: needs evaluation of weight loss, FTT getting worse.        Consultants:  GI  Procedures:  None  Antimicrobials:    Subjective: He report abdominal pain, starting to drink some fluids. He has not been able to eat a lot.    Objective: Vitals:   08/24/21 0937 08/24/21 1648 08/24/21 2025 08/25/21 0542  BP: (!) 95/54 (!) 102/52  94/63 105/69  Pulse: 73 78 77 69  Resp: 18 17 17 16   Temp: 98.4 F (36.9 C) 98.2 F (36.8 C) 98.3 F (36.8 C) 97.9 F (36.6 C)  TempSrc: Oral Oral Oral Axillary  SpO2: 96% 93% 96%  96%  Weight:      Height:        Intake/Output Summary (Last 24 hours) at 08/25/2021 0755 Last data filed at 08/25/2021 0036 Gross per 24 hour  Intake 1305.65 ml  Output 1100 ml  Net 205.65 ml   Filed Weights   08/24/21 0124  Weight: 74.8 kg    Examination:  General exam: Appears calm and comfortable , thin appearing Respiratory system: Clear to auscultation. Respiratory effort normal. Cardiovascular system: S1 & S2 heard, RRR. No JVD, murmurs, rubs, gallops or clicks. No pedal edema. Gastrointestinal system: Abdomen is non distended, mild tender, soft.  Central nervous system: Alert and oriented.  Extremities: Symmetric 5 x 5 power.   Data Reviewed: I have personally reviewed following labs and imaging studies  CBC: Recent Labs  Lab 08/18/21 1005 08/22/21 1912 08/24/21 0212 08/25/21 0350  WBC 9.5 8.5 8.2 7.7  NEUTROABS 7.0  --  5.7 5.6  HGB 14.4 13.7 13.6 11.6*  HCT 41.7 41.2 39.6 34.0*  MCV 95.2 96.7 94.5 95.2  PLT 177 189 147* 277*   Basic Metabolic Panel: Recent Labs  Lab 08/18/21 1005 08/22/21 1912 08/24/21 0212 08/25/21 0350  NA 137 136 137 137  K 5.2* 5.3* 4.6 3.8  CL 100 100 103 106  CO2 27 29 26 22   GLUCOSE 117* 116* 96 78  BUN 14 11 10  7*  CREATININE 1.16 1.07 0.96 0.85  CALCIUM 9.5 9.1 8.7* 8.0*  MG  --   --   --  2.0   GFR: Estimated Creatinine Clearance: 70.2 mL/min (by C-G formula based on SCr of 0.85 mg/dL). Liver Function Tests: Recent Labs  Lab 08/18/21 1005 08/22/21 1912 08/24/21 0212 08/25/21 0350  AST 72* 66* 57* 44*  ALT 89* 83* 74* 58*  ALKPHOS 55 53 55 44  BILITOT 0.8 0.9 0.7 0.7  PROT 7.0 6.2* 5.6* 4.8*  ALBUMIN 3.9 3.3* 3.1* 2.5*   Recent Labs  Lab 08/22/21 1912 08/24/21 0212  LIPASE 31 29   No results for input(s): AMMONIA in the last 168 hours. Coagulation Profile: No results for input(s): INR, PROTIME in the last 168 hours. Cardiac Enzymes: No results for input(s): CKTOTAL, CKMB, CKMBINDEX, TROPONINI in  the last 168 hours. BNP (last 3 results) No results for input(s): PROBNP in the last 8760 hours. HbA1C: No results for input(s): HGBA1C in the last 72 hours. CBG: No results for input(s): GLUCAP in the last 168 hours. Lipid Profile: No results for input(s): CHOL, HDL, LDLCALC, TRIG, CHOLHDL, LDLDIRECT in the last 72 hours. Thyroid Function Tests: No results for input(s): TSH, T4TOTAL, FREET4, T3FREE, THYROIDAB in the last 72 hours. Anemia Panel: No results for input(s): VITAMINB12, FOLATE, FERRITIN, TIBC, IRON, RETICCTPCT in the last 72 hours. Sepsis Labs: No results for input(s): PROCALCITON, LATICACIDVEN in the last 168 hours.  Recent Results (from the past 240 hour(s))  Resp Panel by RT-PCR (Flu A&B, Covid) Nasopharyngeal Swab     Status: None   Collection Time: 08/24/21  2:56 AM   Specimen: Nasopharyngeal Swab; Nasopharyngeal(NP) swabs in vial transport medium  Result Value Ref Range Status   SARS Coronavirus 2 by RT PCR NEGATIVE NEGATIVE Final    Comment: (NOTE) SARS-CoV-2 target nucleic acids are NOT  DETECTED.  The SARS-CoV-2 RNA is generally detectable in upper respiratory specimens during the acute phase of infection. The lowest concentration of SARS-CoV-2 viral copies this assay can detect is 138 copies/mL. A negative result does not preclude SARS-Cov-2 infection and should not be used as the sole basis for treatment or other patient management decisions. A negative result may occur with  improper specimen collection/handling, submission of specimen other than nasopharyngeal swab, presence of viral mutation(s) within the areas targeted by this assay, and inadequate number of viral copies(<138 copies/mL). A negative result must be combined with clinical observations, patient history, and epidemiological information. The expected result is Negative.  Fact Sheet for Patients:  EntrepreneurPulse.com.au  Fact Sheet for Healthcare Providers:   IncredibleEmployment.be  This test is no t yet approved or cleared by the [place] FDA and  has been authorized for detection and/or diagnosis of SARS-CoV-2 by FDA under an Emergency Use Authorization (EUA). This EUA will remain  in effect (meaning this test can be used) for the duration of the COVID-19 declaration under Section 564(b)(1) of the Act, 21 U.S.C.section 360bbb-3(b)(1), unless the authorization is terminated  or revoked sooner.       Influenza A by PCR NEGATIVE NEGATIVE Final   Influenza B by PCR NEGATIVE NEGATIVE Final    Comment: (NOTE) The Xpert Xpress SARS-CoV-2/FLU/RSV plus assay is intended as an aid in the diagnosis of influenza from Nasopharyngeal swab specimens and should not be used as a sole basis for treatment. Nasal washings and aspirates are unacceptable for Xpert Xpress SARS-CoV-2/FLU/RSV testing.  Fact Sheet for Patients: EntrepreneurPulse.com.au  Fact Sheet for Healthcare Providers: IncredibleEmployment.be  This test is not yet approved or cleared by the [place] FDA and has been authorized for detection and/or diagnosis of SARS-CoV-2 by FDA under an Emergency Use Authorization (EUA). This EUA will remain in effect (meaning this test can be used) for the duration of the COVID-19 declaration under Section 564(b)(1) of the Act, 21 U.S.C. section 360bbb-3(b)(1), unless the authorization is terminated or revoked.  Performed at [facility], [place] [address], [place], [place] [address]          Radiology Studies: No results found.      Scheduled Meds:  amiodarone  200 mg Oral Daily   aspirin EC  81 mg Oral Daily   levothyroxine  125 mcg Oral Q0600   magnesium oxide  400 mg Oral Daily   pantoprazole (PROTONIX) IV  40 mg Intravenous Daily   rosuvastatin  20 mg Oral Daily   Continuous Infusions:  sodium chloride 75 mL/hr at 08/24/21 1430     LOS: 0 days    Time  spent: 35 minutes     Slayter Moorhouse A Hailly Fess, MD [facility]   If 7PM-7AM, please contact night-coverage [email or web address]  08/25/2021, 7:55 AM

## 2021-08-25 NOTE — Care Management Obs Status (Signed)
[employer] NOTIFICATION   Patient Details  Name: Jerry Farley MRN: [id number] Date of Birth: Jun 16, 1946   Medicare Observation Status Notification Given:  Yes    Tom-Johnson, Renea Ee, RN 08/25/2021, 10:07 AM

## 2021-08-26 ENCOUNTER — Inpatient Hospital Stay (HOSPITAL_COMMUNITY): Payer: Medicare HMO

## 2021-08-26 ENCOUNTER — Telehealth: Payer: Self-pay | Admitting: Internal Medicine

## 2021-08-26 ENCOUNTER — Other Ambulatory Visit: Payer: Self-pay

## 2021-08-26 DIAGNOSIS — R634 Abnormal weight loss: Secondary | ICD-10-CM | POA: Diagnosis not present

## 2021-08-26 LAB — CBC
HCT: 34.5 % — ABNORMAL LOW (ref 39.0–52.0)
Hemoglobin: 12 g/dL — ABNORMAL LOW (ref 13.0–17.0)
MCH: 33.1 pg (ref 26.0–34.0)
MCHC: 34.8 g/dL (ref 30.0–36.0)
MCV: 95 fL (ref 80.0–100.0)
Platelets: 139 10*3/uL — ABNORMAL LOW (ref 150–400)
RBC: 3.63 MIL/uL — ABNORMAL LOW (ref 4.22–5.81)
RDW: 13.5 % (ref 11.5–15.5)
WBC: 7.6 10*3/uL (ref 4.0–10.5)
nRBC: 0 % (ref 0.0–0.2)

## 2021-08-26 LAB — BRAIN NATRIURETIC PEPTIDE: B Natriuretic Peptide: 491.1 pg/mL — ABNORMAL HIGH (ref 0.0–100.0)

## 2021-08-26 LAB — BASIC METABOLIC PANEL
Anion gap: 10 (ref 5–15)
BUN: 7 mg/dL — ABNORMAL LOW (ref 8–23)
CO2: 25 mmol/L (ref 22–32)
Calcium: 8.4 mg/dL — ABNORMAL LOW (ref 8.9–10.3)
Chloride: 104 mmol/L (ref 98–111)
Creatinine, Ser: 0.86 mg/dL (ref 0.61–1.24)
GFR, Estimated: >60 mL/min (ref >60)
Glucose, Bld: 96 mg/dL (ref 70–99)
Potassium: 3.8 mmol/L (ref 3.5–5.1)
Sodium: 139 mmol/L (ref 135–145)

## 2021-08-26 MED ORDER — ALBUMIN HUMAN 25 % IV SOLN: 25.0000 g | Freq: Once | INTRAVENOUS | Status: DC

## 2021-08-26 MED ORDER — NEPRO/CARBSTEADY PO LIQD
237.0000 mL | Freq: Two times a day (BID) | ORAL | Status: DC
  Administered 2021-08-26: 237 mL via ORAL

## 2021-08-26 MED ORDER — POTASSIUM CHLORIDE CRYS ER 20 MEQ PO TBCR
40.0000 meq | EXTENDED_RELEASE_TABLET | Freq: Once | ORAL | Status: AC
  Administered 2021-08-26: 40 meq via ORAL
  Filled 2021-08-26: qty 2

## 2021-08-26 MED ORDER — FUROSEMIDE 10 MG/ML IJ SOLN
20.0000 mg | Freq: Once | INTRAMUSCULAR | Status: AC
  Administered 2021-08-26: 20 mg via INTRAVENOUS
  Filled 2021-08-26: qty 2

## 2021-08-26 NOTE — Progress Notes (Signed)
EGD set for 0730 on Thursday 2/2 for eval dysphagia and abnormal CT concerning for GE jx cancer.  Will have been off plavix for 5 d.   Azucena Freed PA-C

## 2021-08-26 NOTE — Evaluation (Signed)
Occupational Therapy Evaluation Patient Details Name: Jerry Farley MRN: [id number] DOB: 07/16/1946 Today's Date: 08/26/2021   History of Present Illness 76 year old male who presents to [facility] emergency department with inability to tolerate oral intake, and unintentional weight loss;  with past medical history of coronary artery disease (PCI to 1st OM, last cath 01/2019), ICD placement (originally medtronic, St. Jude replacement [id number]/6213), systolic and diastolic congestive heart failure (Echo 07/2021 EF 20-25% with G1DD), hyperlipidemia , hypothyroidism and ventricular tachycardia status post RFCA 01/2021   Clinical Impression   Pt at PLOF lives with wife and son but limited in mobility prior to admission. Pt's son would be close when completion of steps in/out of home and had one dizzy episode about 1 month ago. Pt in session on RA was able to remain at 93-97% o2. Pt completed LB dressing post set up while sitting at EOB. Pt noted in session increase in balance/posture with FW use at this time but reported unsure with walker use at this time. Pt currently with functional limitations due to the deficits listed below (see OT Problem List).  Pt will benefit from skilled OT to increase their safety and independence with ADL and functional mobility for ADL to facilitate discharge to venue listed below.        Recommendations for follow up therapy are one component of a multi-disciplinary discharge planning process, led by the attending physician.  Recommendations may be updated based on patient status, additional functional criteria and insurance authorization.   Follow Up Recommendations  Home health OT    Assistance Recommended at Discharge Intermittent Supervision/Assistance  Patient can return home with the following A little help with walking and/or transfers;A little help with bathing/dressing/bathroom;Assistance with cooking/housework;Assist for transportation    Functional  Status Assessment  Patient has had a recent decline in their functional status and demonstrates the ability to make significant improvements in function in a reasonable and predictable amount of time.  Equipment Recommendations  Tub/shower seat    Recommendations for Other Services Speech consult     Precautions / Restrictions Precautions Precautions: Fall (reported 1 dizzy episode in the past) Precaution Comments: watch BP Restrictions Weight Bearing Restrictions: No      Mobility Bed Mobility Overal bed mobility: Modified Independent             General bed mobility comments: HOB elevated and use of bed rail    Transfers Overall transfer level: Needs assistance Equipment used: Rolling walker (2 wheels), 1 person hand held assist Transfers: Sit to/from Stand Sit to Stand: Supervision           General transfer comment: cues on pacing self      Balance Overall balance assessment: Needs assistance Sitting-balance support: No upper extremity supported Sitting balance-Leahy Scale: Good     Standing balance support: No upper extremity supported, Bilateral upper extremity supported Standing balance-Leahy Scale: Fair Standing balance comment: Pt improved posture with walker use                           ADL either performed or assessed with clinical judgement   ADL Overall ADL's : Needs assistance/impaired Eating/Feeding: Independent   Grooming: Wash/dry hands;Wash/dry face;Min guard;Standing   Upper Body Bathing: Set up;Sitting   Lower Body Bathing: Set up;Cueing for safety;Cueing for sequencing;Sit to/from stand   Upper Body Dressing : Set up;Cueing for safety;Cueing for sequencing;Sitting   Lower Body Dressing: Set up;Cueing for safety;Cueing for sequencing;Sit  to/from stand   Toilet Transfer: Min guard;Cueing for safety;Cueing for sequencing   Toileting- Water quality scientist and Hygiene: Min guard;Cueing for safety;Cueing for  sequencing;Sit to/from stand   Tub/ Shower Transfer: Minimal assistance;Cueing for sequencing;Rolling walker (2 wheels);Tub bench   Functional mobility during ADLs: Min guard;Rolling walker (2 wheels) General ADL Comments: Pt trialed with walker and without at the time of visit and increase in stability with walker use, discussed with PT     Vision         Perception     Praxis      Pertinent Vitals/Pain Pain Assessment Pain Assessment: No/denies pain     Hand Dominance Right   Extremity/Trunk Assessment Upper Extremity Assessment Upper Extremity Assessment: Overall WFL for tasks assessed   Lower Extremity Assessment Lower Extremity Assessment: Defer to PT evaluation   Cervical / Trunk Assessment Cervical / Trunk Assessment: Kyphotic   Communication Communication Communication: No difficulties   Cognition Arousal/Alertness: Awake/alert Behavior During Therapy: WFL for tasks assessed/performed Overall Cognitive Status: Within Functional Limits for tasks assessed                                       General Comments       Exercises     Shoulder Instructions      Home Living Family/patient expects to be discharged to:: Private residence Living Arrangements: Spouse/significant other Available Help at Discharge: Family Type of Home: House Home Access: Stairs to enter Technical brewer of Steps: 2   Home Layout: One level     Bathroom Shower/Tub: Corporate investment banker: Standard     Home Equipment: Shower seat          Prior Functioning/Environment Prior Level of Function : Independent/Modified Independent             Mobility Comments: started to decline leading up to ED visit as only going out for MD visists          OT Problem List: Decreased strength;Decreased activity tolerance;Impaired balance (sitting and/or standing);Decreased safety awareness;Decreased knowledge of use of DME or  AE;Cardiopulmonary status limiting activity      OT Treatment/Interventions: Self-care/ADL training;Therapeutic exercise;Therapeutic activities;Patient/family education;Balance training    OT Goals(Current goals can be found in the care plan section) Acute Rehab OT Goals Patient Stated Goal: to get stronger OT Goal Formulation: With patient Time For Goal Achievement: 09/09/21 Potential to Achieve Goals: Good ADL Goals Pt Will Perform Grooming: with modified independence;standing Pt Will Perform Upper Body Bathing: with modified independence;sitting Pt Will Perform Lower Body Bathing: with modified independence;sit to/from stand Pt Will Perform Tub/Shower Transfer: with modified independence;ambulating  OT Frequency: Min 2X/week    Co-evaluation              AM-PAC OT "6 Clicks" Daily Activity     Outcome Measure Help from another person eating meals?: None Help from another person taking care of personal grooming?: A Little Help from another person toileting, which includes using toliet, bedpan, or urinal?: A Little Help from another person bathing (including washing, rinsing, drying)?: A Little Help from another person to put on and taking off regular upper body clothing?: A Little Help from another person to put on and taking off regular lower body clothing?: A Little 6 Click Score: 19   End of Session Equipment Utilized During Treatment: Gait belt;Rolling walker (2 wheels) Nurse Communication: Mobility status (o2  status)  Activity Tolerance: Patient tolerated treatment well Patient left: in chair;with call bell/phone within reach;with chair alarm set;with family/visitor present  OT Visit Diagnosis: Unsteadiness on feet (R26.81);Other abnormalities of gait and mobility (R26.89);Muscle weakness (generalized) (M62.81)                Time: 5449-2010 OT Time Calculation (min): 36 min Charges:  OT General Charges $OT Visit: 1 Visit OT Evaluation $OT Eval Low Complexity: 1  Low OT Treatments $Self Care/Home Management : 8-22 mins  Joeseph Amor OTR/L  Acute Rehab Services  [phone] office number [phone] pager number   Joeseph Amor 08/26/2021, 10:38 AM

## 2021-08-26 NOTE — Telephone Encounter (Signed)
I called patient's wife-unable to reach; left a voicemail -noted the work-up being done at [facility].  Recommend PET scan to be scheduled outpatient/when the patient is discharged from the hospital.  GB

## 2021-08-26 NOTE — Progress Notes (Signed)
Notified by RN that pt is SOB and has O2 sat of 93% with RR of 24. Has had medication changes recently. Was taken off spironolactone. Has hx of CHF.  Check CXR, BNP.  Continue telemetry monitoring

## 2021-08-26 NOTE — Evaluation (Signed)
Physical Therapy Evaluation Patient Details Name: Jerry Farley MRN: [id number] DOB: 11/07/1945 Today's Date: 08/26/2021  History of Present Illness  76 year old male who presents to [facility] emergency department with inability to tolerate oral intake, and unintentional weight loss;  with past medical history of coronary artery disease (PCI to 1st OM, last cath 01/2019), ICD placement (originally medtronic, St. Jude replacement 09/5007), systolic and diastolic congestive heart failure (Echo 07/2021 EF 20-25% with G1DD), hyperlipidemia , hypothyroidism and ventricular tachycardia status post RFCA 01/2021  Clinical Impression   Pt admitted with above diagnosis. Lives at home with wife; independent at baseline until mid to late 2022, when he began experiencing functional decline; Wife reports recent near falls, preceded by lightheadedness; Presents to PT with decr activity tolerance, likely related to BP drop with upright activity; Overall Min assist/supervision for transfers and short distance amb; Reported dizziness after walkign to bathroom, and tehn walking to and standing at sink; BP sitting  78/54, tehn feet up and reclined 96/55; RN and MD aware;  Pt currently with functional limitations due to the deficits listed below (see PT Problem List). Pt will benefit from skilled PT to increase their independence and safety with mobility to allow discharge to the venue listed below.          Recommendations for follow up therapy are one component of a multi-disciplinary discharge planning process, led by the attending physician.  Recommendations may be updated based on patient status, additional functional criteria and insurance authorization.  Follow Up Recommendations Home health PT (May progress well enough to not need PT)    Assistance Recommended at Discharge Intermittent Supervision/Assistance  Patient can return home with the following  A little help with walking and/or  transfers;Assistance with cooking/housework    Equipment Recommendations Rolling walker (2 wheels);BSC/3in1  Recommendations for Other Services       Functional Status Assessment Patient has had a recent decline in their functional status and demonstrates the ability to make significant improvements in function in a reasonable and predictable amount of time.     Precautions / Restrictions Precautions Precautions: Fall (reported 1 dizzy episode in the past) Precaution Comments: watch BP Restrictions Weight Bearing Restrictions: No      Mobility  Bed Mobility                    Transfers Overall transfer level: Needs assistance Equipment used: Rolling walker (2 wheels), 1 person hand held assist Transfers: Sit to/from Stand Sit to Stand: Min guard           General transfer comment: Cues for hand placment    Ambulation/Gait Ambulation/Gait assistance: Min guard (with and without physical contact ) Gait Distance (Feet): 50 Feet Assistive device: Rolling walker (2 wheels) Gait Pattern/deviations: Step-through pattern       General Gait Details: Cues to self-monitor for activity tolerance; dizziness reported at end of walk  Stairs            Wheelchair Mobility    Modified Rankin (Stroke Patients Only)       Balance     Sitting balance-Leahy Scale: Good       Standing balance-Leahy Scale: Fair                               Pertinent Vitals/Pain Pain Assessment Pain Assessment: No/denies pain    Home Living Family/patient expects to be discharged to:: Private residence Living Arrangements:  Spouse/significant other Available Help at Discharge: Family Type of Home: House Home Access: Stairs to enter   Technical brewer of Steps: 2   Home Layout: One level Home Equipment: Shower seat      Prior Function Prior Level of Function : Independent/Modified Independent             Mobility Comments: started to  decline leading up to ED visit as only going out for MD visists       Hand Dominance   Dominant Hand: Right    Extremity/Trunk Assessment   Upper Extremity Assessment Upper Extremity Assessment: Defer to OT evaluation    Lower Extremity Assessment Lower Extremity Assessment: Generalized weakness    Cervical / Trunk Assessment Cervical / Trunk Assessment: Kyphotic  Communication   Communication: No difficulties  Cognition Arousal/Alertness: Awake/alert Behavior During Therapy: WFL for tasks assessed/performed Overall Cognitive Status: Within Functional Limits for tasks assessed                                          General Comments General comments (skin integrity, edema, etc.): Reports dizziness with increased time participating in standing/upright activities; See vitals flow sheet.     Exercises     Assessment/Plan    PT Assessment Patient needs continued PT services  PT Problem List Decreased strength;Decreased activity tolerance;Decreased balance;Decreased mobility;Decreased coordination;Decreased knowledge of use of DME;Decreased safety awareness;Decreased knowledge of precautions       PT Treatment Interventions DME instruction;Gait training;Stair training;Functional mobility training;Therapeutic activities;Therapeutic exercise;Balance training;Patient/family education    PT Goals (Current goals can be found in the Care Plan section)  Acute Rehab PT Goals Patient Stated Goal: Figure out the nature of his recent medical problems PT Goal Formulation: With patient Time For Goal Achievement: 09/09/21 Potential to Achieve Goals: Good    Frequency Min 3X/week     Co-evaluation               AM-PAC PT "6 Clicks" Mobility  Outcome Measure Help needed turning from your back to your side while in a flat bed without using bedrails?: None Help needed moving from lying on your back to sitting on the side of a flat bed without using  bedrails?: None Help needed moving to and from a bed to a chair (including a wheelchair)?: A Little Help needed standing up from a chair using your arms (e.g., wheelchair or bedside chair)?: A Little Help needed to walk in hospital room?: A Little Help needed climbing 3-5 steps with a railing? : A Little 6 Click Score: 20    End of Session   Activity Tolerance: Other (comment) (limited by BP drop with incr time standing) Patient left: in chair;with call bell/phone within reach;with family/visitor present Nurse Communication: Mobility status;Other (comment) (BPs) PT Visit Diagnosis: Unsteadiness on feet (R26.81);Other abnormalities of gait and mobility (R26.89);Dizziness and giddiness (R42)    Time: 1010-1030 PT Time Calculation (min) (ACUTE ONLY): 20 min   Charges:   PT Evaluation $PT Eval Moderate Complexity: 1 Mod          Roney [place], [place]  Acute Rehabilitation Services Pager [phone] Office [phone]   Colletta [place] 08/26/2021, 1:31 PM

## 2021-08-26 NOTE — Progress Notes (Signed)
° ° °  Gastroenterology Inpatient Follow Up    Subjective: - Attempting to take in PO, eating [place] ice  Objective: Vital signs in last 24 hours: Temp:  [97.8 F (36.6 C)-98 F (36.7 C)] 97.9 F (36.6 C) (01/31 2101) Pulse Rate:  [69-78] 73 (01/31 2101) Resp:  [16-18] 18 (01/31 2101) BP: (78-113)/(46-60) 106/60 (01/31 2101) SpO2:  [94 %-100 %] 98 % (01/31 2101) Last BM Date: 08/24/21  Intake/Output from previous day: 01/30 0701 - 01/31 0700 In: 840 [P.O.:840] Out: 600 [Urine:600] Intake/Output this shift: No intake/output data recorded.  General appearance: alert and cooperative Resp: no increased WOB Cardio: regular rate GI: soft, non-tender Extremities: extremities normal, atraumatic, no cyanosis or edema  Lab Results: Recent Labs    08/24/21 0212 08/25/21 0350 08/26/21 0656  WBC 8.2 7.7 7.6  HGB 13.6 11.6* 12.0*  HCT 39.6 34.0* 34.5*  PLT 147* 136* 139*   BMET Recent Labs    08/24/21 0212 08/25/21 0350 08/26/21 0656  NA 137 137 139  K 4.6 3.8 3.8  CL 103 106 104  CO2 26 22 25   GLUCOSE 96 78 96  BUN 10 7* 7*  CREATININE 0.96 0.85 0.86  CALCIUM 8.7* 8.0* 8.4*   LFT Recent Labs    08/25/21 0350  PROT 4.8*  ALBUMIN 2.5*  AST 44*  ALT 58*  ALKPHOS 44  BILITOT 0.7   PT/INR No results for input(s): LABPROT, INR in the last 72 hours. Hepatitis Panel No results for input(s): HEPBSAG, HCVAB, HEPAIGM, HEPBIGM in the last 72 hours. C-Diff No results for input(s): CDIFFTOX in the last 72 hours.  Studies/Results: DG CHEST PORT 1 VIEW  Result Date: 08/26/2021 CLINICAL DATA:  Dyspnea, nausea EXAM: PORTABLE CHEST 1 VIEW COMPARISON:  CT chest dated August 15, 2021 FINDINGS: The heart size and mediastinal contours are within normal limits. Atherosclerotic calcification of aortic arch. Pacemaker leads terminating in the right atrium, right ventricle and coronary sinus. Low lung volumes. Bilateral hazy lung opacities, which may be secondary to prominence  of the vessels due to low lung volumes are infectious/inflammatory process. No focal consolidation or pleural effusion. The visualized skeletal structures are unremarkable. IMPRESSION: Low lung volumes with hazy lung opacities bilaterally, which may represent prominent vessels due to low lung volumes. Differential includes infectious/inflammatory process. No focal consolidation or large pleural effusion. Electronically Signed   By: Keane Police D.O.   On: 08/26/2021 08:08    Medications: I have reviewed the patient's current medications. Scheduled:  amiodarone  200 mg Oral Daily   aspirin EC  81 mg Oral Daily   enoxaparin (LOVENOX) injection  40 mg Subcutaneous Q24H   feeding supplement (NEPRO CARB STEADY)  237 mL Oral BID BM   levothyroxine  125 mcg Oral Q0600   magnesium oxide  400 mg Oral Daily   pantoprazole (PROTONIX) IV  40 mg Intravenous Daily   rosuvastatin  20 mg Oral Daily   Continuous:  albumin human Stopped (08/26/21 1359)   YCX:[id number] **OR** acetaminophen, ondansetron **OR** ondansetron (ZOFRAN) IV, oxyCODONE-acetaminophen, oxymetazoline, polyethylene glycol  Assessment/Plan: 32M w/hx of CAD s/p PCI on Plavix, HFrEF (EF 20-25%) s/p ICD, V-tach p/w weight loss and dysphagia. CT concerning for GE junction vs gastric cancer. Last dose of Plavix was 1/28.  - EGD 2/2 after 5 day Plavix washout   LOS: 1 day   Sharyn Creamer 08/26/2021, 9:23 PM

## 2021-08-26 NOTE — H&P (View-Only) (Signed)
° ° °  Gastroenterology Inpatient Follow Up    Subjective: - Attempting to take in PO, eating [place] ice  Objective: Vital signs in last 24 hours: Temp:  [97.8 F (36.6 C)-98 F (36.7 C)] 97.9 F (36.6 C) (01/31 2101) Pulse Rate:  [69-78] 73 (01/31 2101) Resp:  [16-18] 18 (01/31 2101) BP: (78-113)/(46-60) 106/60 (01/31 2101) SpO2:  [94 %-100 %] 98 % (01/31 2101) Last BM Date: 08/24/21  Intake/Output from previous day: 01/30 0701 - 01/31 0700 In: 840 [P.O.:840] Out: 600 [Urine:600] Intake/Output this shift: No intake/output data recorded.  General appearance: alert and cooperative Resp: no increased WOB Cardio: regular rate GI: soft, non-tender Extremities: extremities normal, atraumatic, no cyanosis or edema  Lab Results: Recent Labs    08/24/21 0212 08/25/21 0350 08/26/21 0656  WBC 8.2 7.7 7.6  HGB 13.6 11.6* 12.0*  HCT 39.6 34.0* 34.5*  PLT 147* 136* 139*   BMET Recent Labs    08/24/21 0212 08/25/21 0350 08/26/21 0656  NA 137 137 139  K 4.6 3.8 3.8  CL 103 106 104  CO2 26 22 25   GLUCOSE 96 78 96  BUN 10 7* 7*  CREATININE 0.96 0.85 0.86  CALCIUM 8.7* 8.0* 8.4*   LFT Recent Labs    08/25/21 0350  PROT 4.8*  ALBUMIN 2.5*  AST 44*  ALT 58*  ALKPHOS 44  BILITOT 0.7   PT/INR No results for input(s): LABPROT, INR in the last 72 hours. Hepatitis Panel No results for input(s): HEPBSAG, HCVAB, HEPAIGM, HEPBIGM in the last 72 hours. C-Diff No results for input(s): CDIFFTOX in the last 72 hours.  Studies/Results: DG CHEST PORT 1 VIEW  Result Date: 08/26/2021 CLINICAL DATA:  Dyspnea, nausea EXAM: PORTABLE CHEST 1 VIEW COMPARISON:  CT chest dated August 15, 2021 FINDINGS: The heart size and mediastinal contours are within normal limits. Atherosclerotic calcification of aortic arch. Pacemaker leads terminating in the right atrium, right ventricle and coronary sinus. Low lung volumes. Bilateral hazy lung opacities, which may be secondary to prominence  of the vessels due to low lung volumes are infectious/inflammatory process. No focal consolidation or pleural effusion. The visualized skeletal structures are unremarkable. IMPRESSION: Low lung volumes with hazy lung opacities bilaterally, which may represent prominent vessels due to low lung volumes. Differential includes infectious/inflammatory process. No focal consolidation or large pleural effusion. Electronically Signed   By: Keane Police D.O.   On: 08/26/2021 08:08    Medications: I have reviewed the patient's current medications. Scheduled:  amiodarone  200 mg Oral Daily   aspirin EC  81 mg Oral Daily   enoxaparin (LOVENOX) injection  40 mg Subcutaneous Q24H   feeding supplement (NEPRO CARB STEADY)  237 mL Oral BID BM   levothyroxine  125 mcg Oral Q0600   magnesium oxide  400 mg Oral Daily   pantoprazole (PROTONIX) IV  40 mg Intravenous Daily   rosuvastatin  20 mg Oral Daily   Continuous:  albumin human Stopped (08/26/21 1359)   ZOX:[id number] **OR** acetaminophen, ondansetron **OR** ondansetron (ZOFRAN) IV, oxyCODONE-acetaminophen, oxymetazoline, polyethylene glycol  Assessment/Plan: 73M w/hx of CAD s/p PCI on Plavix, HFrEF (EF 20-25%) s/p ICD, V-tach p/w weight loss and dysphagia. CT concerning for GE junction vs gastric cancer. Last dose of Plavix was 1/28.  - EGD 2/2 after 5 day Plavix washout   LOS: 1 day   Sharyn Creamer 08/26/2021, 9:23 PM

## 2021-08-26 NOTE — TOC Progression Note (Signed)
Transition of Care [facility]) - Progression Note    Patient Details  Name: Jerry Farley MRN: [id number] Date of Birth: Oct 21, 1945  [facility] CM/SW Contact  Tom-Johnson, Renea Ee, RN Phone Number: 08/26/2021, 4:31 PM  Clinical Narrative:    PT/OT recommended Home[facility]. Patient states he has no preference. Referral made with Alvis Lemmings and Tommi Rumps [phone]) voiced acceptance. BSC, tub bench and RW ordered form [employer] and Freda Munro [phone]) to deliver at bedside. CM will continue to follow with needs.    Barriers to Discharge: Continued Medical Work up  Expected Discharge Plan and Services     Discharge Planning Services: CM Consult   Living arrangements for the past 2 months: Single Family Home                                       Social Determinants of Health (SDOH) Interventions    Readmission Risk Interventions No flowsheet data found.

## 2021-08-26 NOTE — Progress Notes (Signed)
PROGRESS NOTE    Jerry Farley  EPP:[id number] DOB: 05-04-1946 DOA: 08/24/2021 PCP: Baxter Hire, MD   Brief Narrative: 76 year old past medical history significant for CAD status post PCI cath 02/13/2019, ICD placement, systolic and diastolic heart failure ejection fraction 20 to 25% echo 6/06 grade 1 diastolic dysfunction, hypothyroidism, ventricular tachycardia status post RF CVA 01/2021 who presents to [facility], ED with inability to tolerate oral intake.  Patient has been experiencing difficulty tolerating oral intake over the last several month.  Associated nausea and lack of appetite, ongoing vague abdominal and mid back discomfort.  Report ongoing unintentional 40 pound weight loss.  Patient had a CT scan 1/19 at [facility] ED which revealed multiple enlarged lymph nodes surrounding the gastroesophageal junction extending toward the pancreatic head with central decreased at admission suspicious for necrosis.  Had arrangement to follow-up outpatient with oncology whom the patient saw 1/23 at that time PET scan was recommended which has been ordered but has not yet to be completed.  Tumor marker CEA: normal, markedly elevated CEA 19-9: 765.  Patient presented to the ED for further evaluation due to worsening symptoms.   Assessment & Plan:   Principal Problem:   Unintentional weight loss Active Problems:   Coronary artery disease involving native coronary artery of native heart without angina pectoris   Chronic combined systolic and diastolic congestive heart failure (HCC)   Elevated CA 19-9 level   Epigastric pain   Mixed hyperlipidemia   Hypothyroidism   Abnormal CT scan, gastrointestinal tract   Esophageal dysphagia   Failure to thrive in adult  1-Unintentional Weight loss//Epigastric pain.  -Patient presented with several month history of epigastric pain radiating to the back, early satiety, generalized weakness and unintentional weight loss. -CT revealing necrotic  lymphadenopathy at the GE junction as well as near the head of the pancreas with markedly elevated CA 19-9 level -GI has been consulted plan for endoscopy after Plavix washout for 5 days -Patient continues to have poor oral intake. Hold IV fluids today to avoid pulmonary edema -Continue Clear diet. He doesn't tolerates Ensure.  -IV Protonix.  -Last dose of plavix was Saturday 1/28 per patient. Plan for endoscopy 2/02.  2-Chronic combined systolic and diastolic heart failure: Continue amiodarone. Holding coreg due to soft BP ECHO; EF 20-25 % NSL.  Develops Dyspnea overnight night, he vomited ensure yesterday, was coughing more/  Chest x ray: Low lung Volume with hazy opacities BL, may represent prominent vessel due to low lung volume. Differential : Infectious/inflammatory.  Concern for HF exacerbation Vs aspiration event. Will give one low dose IV lasix and monitor for fever, if he spike fever, will need to start IV antibiotics.   3-CAD, involving native arteries: -Chest pain-free -Holding Plavix in anticipation of endoscopy -Continue Crestor  4-Mixed Hyperlipidemia: Continue with  5-Hypothyroidism: Continue with Synthroid  6-Hypotension;  Received fluids.  Recently stop spironolactone.  Carvedilol dose reduce to 3.1 out patient.  Holding in the hospital.  He was orthostatic positive during evaluation with PT . Will avoid IV fluids due to concern earlier for pulmonary edema. SBP above 90. Will order TED hose. He needs improve in nutrition.   Mild transaminases;  Monitor.   Estimated body mass index is 25.84 kg/m as calculated from the following:   Height as of this encounter: 5\' 7"  (1.702 m).   Weight as of this encounter: 74.8 kg.   DVT prophylaxis: Lovenox, hold prior to procedure.  Code Status: Full code Family Communication: Care discussed  with patient.  Disposition Plan:  Status is: Inpatient.   The patient will require care spanning > 2 midnights and should be moved  to inpatient because: needs evaluation of weight loss, FTT getting worse.        Consultants:  GI  Procedures:  None  Antimicrobials:    Subjective: He is breathing better on oxygen. Wife at bedside report he vomited and was coughing more after he drank ensure yesterday. Change ensure to nepro.  He had some SOB  at 6 am. He was placed on oxygen.    Objective: Vitals:   08/25/21 0908 08/25/21 1639 08/25/21 2055 08/26/21 0503  BP: (!) 98/57 (!) 98/58 (!) 93/50 (!) 113/54  Pulse: 73 78 68 78  Resp: 18 19 18 17   Temp: 97.9 F (36.6 C) 97.8 F (36.6 C) 98 F (36.7 C) 97.8 F (36.6 C)  TempSrc: Oral Oral  Oral  SpO2: 93% 100% 93% 94%  Weight:      Height:        Intake/Output Summary (Last 24 hours) at 08/26/2021 0845 Last data filed at 08/26/2021 0700 Gross per 24 hour  Intake 840 ml  Output 600 ml  Net 240 ml    Filed Weights   08/24/21 0124  Weight: 74.8 kg    Examination:  General exam: Thin appearing Respiratory system: Crackles bases Cardiovascular system: S 1, S 2 RRR Gastrointestinal system: BS present, soft nt Central nervous system: alert, and oriented. Extremities: no edema   Data Reviewed: I have personally reviewed following labs and imaging studies  CBC: Recent Labs  Lab 08/22/21 1912 08/24/21 0212 08/25/21 0350  WBC 8.5 8.2 7.7  NEUTROABS  --  5.7 5.6  HGB 13.7 13.6 11.6*  HCT 41.2 39.6 34.0*  MCV 96.7 94.5 95.2  PLT 189 147* 136*    Basic Metabolic Panel: Recent Labs  Lab 08/22/21 1912 08/24/21 0212 08/25/21 0350  NA 136 137 137  K 5.3* 4.6 3.8  CL 100 103 106  CO2 29 26 22   GLUCOSE 116* 96 78  BUN 11 10 7*  CREATININE 1.07 0.96 0.85  CALCIUM 9.1 8.7* 8.0*  MG  --   --  2.0    GFR: Estimated Creatinine Clearance: 70.2 mL/min (by C-G formula based on SCr of 0.85 mg/dL). Liver Function Tests: Recent Labs  Lab 08/22/21 1912 08/24/21 0212 08/25/21 0350  AST 66* 57* 44*  ALT 83* 74* 58*  ALKPHOS 53 55 44   BILITOT 0.9 0.7 0.7  PROT 6.2* 5.6* 4.8*  ALBUMIN 3.3* 3.1* 2.5*    Recent Labs  Lab 08/22/21 1912 08/24/21 0212  LIPASE 31 29    No results for input(s): AMMONIA in the last 168 hours. Coagulation Profile: No results for input(s): INR, PROTIME in the last 168 hours. Cardiac Enzymes: No results for input(s): CKTOTAL, CKMB, CKMBINDEX, TROPONINI in the last 168 hours. BNP (last 3 results) No results for input(s): PROBNP in the last 8760 hours. HbA1C: No results for input(s): HGBA1C in the last 72 hours. CBG: No results for input(s): GLUCAP in the last 168 hours. Lipid Profile: No results for input(s): CHOL, HDL, LDLCALC, TRIG, CHOLHDL, LDLDIRECT in the last 72 hours. Thyroid Function Tests: No results for input(s): TSH, T4TOTAL, FREET4, T3FREE, THYROIDAB in the last 72 hours. Anemia Panel: No results for input(s): VITAMINB12, FOLATE, FERRITIN, TIBC, IRON, RETICCTPCT in the last 72 hours. Sepsis Labs: No results for input(s): PROCALCITON, LATICACIDVEN in the last 168 hours.  Recent Results (from the  past 240 hour(s))  Resp Panel by RT-PCR (Flu A&B, Covid) Nasopharyngeal Swab     Status: None   Collection Time: 08/24/21  2:56 AM   Specimen: Nasopharyngeal Swab; Nasopharyngeal(NP) swabs in vial transport medium  Result Value Ref Range Status   SARS Coronavirus 2 by RT PCR NEGATIVE NEGATIVE Final    Comment: (NOTE) SARS-CoV-2 target nucleic acids are NOT DETECTED.  The SARS-CoV-2 RNA is generally detectable in upper respiratory specimens during the acute phase of infection. The lowest concentration of SARS-CoV-2 viral copies this assay can detect is 138 copies/mL. A negative result does not preclude SARS-Cov-2 infection and should not be used as the sole basis for treatment or other patient management decisions. A negative result may occur with  improper specimen collection/handling, submission of specimen other than nasopharyngeal swab, presence of viral mutation(s)  within the areas targeted by this assay, and inadequate number of viral copies(<138 copies/mL). A negative result must be combined with clinical observations, patient history, and epidemiological information. The expected result is Negative.  Fact Sheet for Patients:  EntrepreneurPulse.com.au  Fact Sheet for Healthcare Providers:  IncredibleEmployment.be  This test is no t yet approved or cleared by the [place] FDA and  has been authorized for detection and/or diagnosis of SARS-CoV-2 by FDA under an Emergency Use Authorization (EUA). This EUA will remain  in effect (meaning this test can be used) for the duration of the COVID-19 declaration under Section 564(b)(1) of the Act, 21 U.S.C.section 360bbb-3(b)(1), unless the authorization is terminated  or revoked sooner.       Influenza A by PCR NEGATIVE NEGATIVE Final   Influenza B by PCR NEGATIVE NEGATIVE Final    Comment: (NOTE) The Xpert Xpress SARS-CoV-2/FLU/RSV plus assay is intended as an aid in the diagnosis of influenza from Nasopharyngeal swab specimens and should not be used as a sole basis for treatment. Nasal washings and aspirates are unacceptable for Xpert Xpress SARS-CoV-2/FLU/RSV testing.  Fact Sheet for Patients: EntrepreneurPulse.com.au  Fact Sheet for Healthcare Providers: IncredibleEmployment.be  This test is not yet approved or cleared by the [place] FDA and has been authorized for detection and/or diagnosis of SARS-CoV-2 by FDA under an Emergency Use Authorization (EUA). This EUA will remain in effect (meaning this test can be used) for the duration of the COVID-19 declaration under Section 564(b)(1) of the Act, 21 U.S.C. section 360bbb-3(b)(1), unless the authorization is terminated or revoked.  Performed at [facility], [place] [address], [place], [place] [address]           Radiology Studies: DG CHEST PORT  1 VIEW  Result Date: 08/26/2021 CLINICAL DATA:  Dyspnea, nausea EXAM: PORTABLE CHEST 1 VIEW COMPARISON:  CT chest dated August 15, 2021 FINDINGS: The heart size and mediastinal contours are within normal limits. Atherosclerotic calcification of aortic arch. Pacemaker leads terminating in the right atrium, right ventricle and coronary sinus. Low lung volumes. Bilateral hazy lung opacities, which may be secondary to prominence of the vessels due to low lung volumes are infectious/inflammatory process. No focal consolidation or pleural effusion. The visualized skeletal structures are unremarkable. IMPRESSION: Low lung volumes with hazy lung opacities bilaterally, which may represent prominent vessels due to low lung volumes. Differential includes infectious/inflammatory process. No focal consolidation or large pleural effusion. Electronically Signed   By: Keane Police D.O.   On: 08/26/2021 08:08        Scheduled Meds:  amiodarone  200 mg Oral Daily   aspirin EC  81 mg Oral  Daily   enoxaparin (LOVENOX) injection  40 mg Subcutaneous Q24H   feeding supplement  237 mL Oral TID BM   furosemide  20 mg Intravenous Once   levothyroxine  125 mcg Oral Q0600   magnesium oxide  400 mg Oral Daily   pantoprazole (PROTONIX) IV  40 mg Intravenous Daily   potassium chloride  40 mEq Oral Once   rosuvastatin  20 mg Oral Daily   Continuous Infusions:     LOS: 1 day    Time spent: 35 minutes     Kindal Ponti A Caira Poche, MD [facility]   If 7PM-7AM, please contact night-coverage [email or web address]  08/26/2021, 8:45 AM

## 2021-08-27 DIAGNOSIS — E43 Unspecified severe protein-calorie malnutrition: Secondary | ICD-10-CM | POA: Insufficient documentation

## 2021-08-27 DIAGNOSIS — R634 Abnormal weight loss: Secondary | ICD-10-CM | POA: Diagnosis not present

## 2021-08-27 LAB — CBC
HCT: 35.6 % — ABNORMAL LOW (ref 39.0–52.0)
Hemoglobin: 11.9 g/dL — ABNORMAL LOW (ref 13.0–17.0)
MCH: 31.7 pg (ref 26.0–34.0)
MCHC: 33.4 g/dL (ref 30.0–36.0)
MCV: 94.9 fL (ref 80.0–100.0)
Platelets: 138 K/uL — ABNORMAL LOW (ref 150–400)
RBC: 3.75 MIL/uL — ABNORMAL LOW (ref 4.22–5.81)
RDW: 13.7 % (ref 11.5–15.5)
WBC: 7.5 K/uL (ref 4.0–10.5)
nRBC: 0 % (ref 0.0–0.2)

## 2021-08-27 LAB — BASIC METABOLIC PANEL
Anion gap: 9 (ref 5–15)
BUN: 7 mg/dL — ABNORMAL LOW (ref 8–23)
CO2: 24 mmol/L (ref 22–32)
Calcium: 8.3 mg/dL — ABNORMAL LOW (ref 8.9–10.3)
Chloride: 105 mmol/L (ref 98–111)
Creatinine, Ser: 0.86 mg/dL (ref 0.61–1.24)
GFR, Estimated: >60 mL/min (ref >60)
Glucose, Bld: 96 mg/dL (ref 70–99)
Potassium: 3.9 mmol/L (ref 3.5–5.1)
Sodium: 138 mmol/L (ref 135–145)

## 2021-08-27 MED ORDER — BOOST / RESOURCE BREEZE PO LIQD CUSTOM
1.0000 | Freq: Three times a day (TID) | ORAL | Status: DC
  Administered 2021-08-27 – 2021-09-01 (×7): 1 via ORAL

## 2021-08-27 MED ORDER — ADULT MULTIVITAMIN W/MINERALS CH
1.0000 | ORAL_TABLET | Freq: Every day | ORAL | Status: DC
  Administered 2021-08-27 – 2021-09-01 (×6): 1 via ORAL
  Filled 2021-08-27 (×7): qty 1

## 2021-08-27 NOTE — Plan of Care (Signed)
  Problem: Clinical Measurements: Goal: Diagnostic test results will improve Outcome: Progressing   

## 2021-08-27 NOTE — Progress Notes (Signed)
° °         Daily Rounding Note  08/27/2021, 11:35 AM  LOS: 2 days   SUBJECTIVE:   Chief complaint:   dysphagia.  ? GE jx cancer   Still w dysphagia and regurge.  Some liquids tolerated.  Feels tired, exhausted.   OBJECTIVE:         Vital signs in last 24 hours:    Temp:  [97.8 F (36.6 C)-98 F (36.7 C)] 98 F (36.7 C) (02/01 0903) Pulse Rate:  [72-74] 73 (02/01 0903) Resp:  [16-18] 18 (02/01 0903) BP: (90-106)/(41-60) 90/48 (02/01 0903) SpO2:  [95 %-100 %] 95 % (02/01 0903) Last BM Date: 08/25/21 Filed Weights   08/24/21 0124  Weight: 74.8 kg   General: looks ill   Heart: RRR Chest: clear bil.  No dyspnea Abdomen: soft, NT.  Active BS  Extremities: no CCE Neuro/Psych:  oriented x 3.  Lethargic but arouseable.    Intake/Output from previous day: 01/31 0701 - 02/01 0700 In: 360 [P.O.:360] Out: 350 [Urine:350]  Intake/Output this shift: Total I/O In: 357 [P.O.:357] Out: -   Lab Results: Recent Labs    08/25/21 0350 08/26/21 0656 08/27/21 0413  WBC 7.7 7.6 7.5  HGB 11.6* 12.0* 11.9*  HCT 34.0* 34.5* 35.6*  PLT 136* 139* 138*   BMET Recent Labs    08/25/21 0350 08/26/21 0656 08/27/21 0413  NA 137 139 138  K 3.8 3.8 3.9  CL 106 104 105  CO2 22 25 24   GLUCOSE 78 96 96  BUN 7* 7* 7*  CREATININE 0.85 0.86 0.86  CALCIUM 8.0* 8.4* 8.3*   LFT Recent Labs    08/25/21 0350  PROT 4.8*  ALBUMIN 2.5*  AST 44*  ALT 58*  ALKPHOS 44  BILITOT 0.7   PT/INR No results for input(s): LABPROT, INR in the last 72 hours. Hepatitis Panel No results for input(s): HEPBSAG, HCVAB, HEPAIGM, HEPBIGM in the last 72 hours.  Studies/Results: DG CHEST PORT 1 VIEW  Result Date: 08/26/2021 CLINICAL DATA:  Dyspnea, nausea EXAM: PORTABLE CHEST 1 VIEW COMPARISON:  CT chest dated August 15, 2021 FINDINGS: The heart size and mediastinal contours are within normal limits. Atherosclerotic calcification of aortic arch.  Pacemaker leads terminating in the right atrium, right ventricle and coronary sinus. Low lung volumes. Bilateral hazy lung opacities, which may be secondary to prominence of the vessels due to low lung volumes are infectious/inflammatory process. No focal consolidation or pleural effusion. The visualized skeletal structures are unremarkable. IMPRESSION: Low lung volumes with hazy lung opacities bilaterally, which may represent prominent vessels due to low lung volumes. Differential includes infectious/inflammatory process. No focal consolidation or large pleural effusion. Electronically Signed   By: Keane Police D.O.   On: 08/26/2021 08:08    ASSESMENT:   Dysphagia.  Concern for malignancy at GE junction.  Heart failure, EF 20 to 25%, CAD.  Chronic Plavix on hold, history PCI.  Thrombocytopenia, noncritical.  Platelets 138.  No splenomegaly on CT.   PLAN      EGD set for 0730 tomorrow.  NPO after MN.      Azucena Freed  08/27/2021, 11:35 AM Phone [phone]

## 2021-08-27 NOTE — Progress Notes (Signed)
PROGRESS NOTE    Jerry Farley  POE:[id number] DOB: 18-Jan-1946 DOA: 08/24/2021 PCP: Baxter Hire, MD    Brief Narrative:  76 year old past medical history significant for CAD status post PCI cath 02/13/2019, ICD placement, systolic and diastolic heart failure ejection fraction 20 to 25% echo 3/15 grade 1 diastolic dysfunction, hypothyroidism, ventricular tachycardia status post RF CVA 01/2021 who presents to [facility], ED with inability to tolerate oral intake.  Patient has been experiencing difficulty tolerating oral intake over the last several month.  Associated nausea and lack of appetite, ongoing vague abdominal and mid back discomfort.  Report ongoing unintentional 40 pound weight loss.   Patient had a CT scan 1/19 at [facility] ED which revealed multiple enlarged lymph nodes surrounding the gastroesophageal junction extending toward the pancreatic head with central decreased at admission suspicious for necrosis.  Had arrangement to follow-up outpatient with oncology whom the patient saw 1/23 at that time PET scan was recommended which has been ordered but has not yet to be completed.  Tumor marker CEA: normal, markedly elevated CEA 19-9: 765.   Patient presented to the ED for further evaluation due to worsening symptoms.  2/1 EGD planned for Thursday   Consultants:  GI  Procedures:   Antimicrobials:      Subjective: Has no complaints of bleed in stool.  Reports shortness of breath little better.  Little dizziness on standing.  Objective: Vitals:   08/26/21 1330 08/26/21 1645 08/26/21 2101 08/27/21 0526  BP: (!) 96/54 (!) 94/54 106/60 (!) 90/41  Pulse:  72 73 74  Resp:  16 18 17   Temp:  98 F (36.7 C) 97.9 F (36.6 C) 97.8 F (36.6 C)  TempSrc:  Oral  Oral  SpO2: 100% 97% 98% 96%  Weight:      Height:        Intake/Output Summary (Last 24 hours) at 08/27/2021 0835 Last data filed at 08/27/2021 0600 Gross per 24 hour  Intake 360 ml  Output 350 ml  Net 10 ml   Filed  Weights   08/24/21 0124  Weight: 74.8 kg    Examination:  General exam: Appears calm and comfortable, sitting in chair Respiratory system: Clear to auscultation. Respiratory effort normal. Cardiovascular system: S1 & S2 heard, RRR. No gallop.  No JVD Gastrointestinal system: Abdomen is nondistended, soft and nontender.. Normal bowel sounds heard. Central nervous system: Alert and oriented.  Grossly intact extremities: No edema Psychiatry: Mood & affect appropriate.     Data Reviewed: I have personally reviewed following labs and imaging studies  CBC: Recent Labs  Lab 08/22/21 1912 08/24/21 0212 08/25/21 0350 08/26/21 0656 08/27/21 0413  WBC 8.5 8.2 7.7 7.6 7.5  NEUTROABS  --  5.7 5.6  --   --   HGB 13.7 13.6 11.6* 12.0* 11.9*  HCT 41.2 39.6 34.0* 34.5* 35.6*  MCV 96.7 94.5 95.2 95.0 94.9  PLT 189 147* 136* 139* 400*   Basic Metabolic Panel: Recent Labs  Lab 08/22/21 1912 08/24/21 0212 08/25/21 0350 08/26/21 0656 08/27/21 0413  NA 136 137 137 139 138  K 5.3* 4.6 3.8 3.8 3.9  CL 100 103 106 104 105  CO2 29 26 22 25 24   GLUCOSE 116* 96 78 96 96  BUN 11 10 7* 7* 7*  CREATININE 1.07 0.96 0.85 0.86 0.86  CALCIUM 9.1 8.7* 8.0* 8.4* 8.3*  MG  --   --  2.0  --   --    GFR: Estimated Creatinine Clearance: 69.4 mL/min (  by C-G formula based on SCr of 0.86 mg/dL). Liver Function Tests: Recent Labs  Lab 08/22/21 1912 08/24/21 0212 08/25/21 0350  AST 66* 57* 44*  ALT 83* 74* 58*  ALKPHOS 53 55 44  BILITOT 0.9 0.7 0.7  PROT 6.2* 5.6* 4.8*  ALBUMIN 3.3* 3.1* 2.5*   Recent Labs  Lab 08/22/21 1912 08/24/21 0212  LIPASE 31 29   No results for input(s): AMMONIA in the last 168 hours. Coagulation Profile: No results for input(s): INR, PROTIME in the last 168 hours. Cardiac Enzymes: No results for input(s): CKTOTAL, CKMB, CKMBINDEX, TROPONINI in the last 168 hours. BNP (last 3 results) No results for input(s): PROBNP in the last 8760 hours. HbA1C: No results  for input(s): HGBA1C in the last 72 hours. CBG: No results for input(s): GLUCAP in the last 168 hours. Lipid Profile: No results for input(s): CHOL, HDL, LDLCALC, TRIG, CHOLHDL, LDLDIRECT in the last 72 hours. Thyroid Function Tests: No results for input(s): TSH, T4TOTAL, FREET4, T3FREE, THYROIDAB in the last 72 hours. Anemia Panel: No results for input(s): VITAMINB12, FOLATE, FERRITIN, TIBC, IRON, RETICCTPCT in the last 72 hours. Sepsis Labs: No results for input(s): PROCALCITON, LATICACIDVEN in the last 168 hours.  Recent Results (from the past 240 hour(s))  Resp Panel by RT-PCR (Flu A&B, Covid) Nasopharyngeal Swab     Status: None   Collection Time: 08/24/21  2:56 AM   Specimen: Nasopharyngeal Swab; Nasopharyngeal(NP) swabs in vial transport medium  Result Value Ref Range Status   SARS Coronavirus 2 by RT PCR NEGATIVE NEGATIVE Final    Comment: (NOTE) SARS-CoV-2 target nucleic acids are NOT DETECTED.  The SARS-CoV-2 RNA is generally detectable in upper respiratory specimens during the acute phase of infection. The lowest concentration of SARS-CoV-2 viral copies this assay can detect is 138 copies/mL. A negative result does not preclude SARS-Cov-2 infection and should not be used as the sole basis for treatment or other patient management decisions. A negative result may occur with  improper specimen collection/handling, submission of specimen other than nasopharyngeal swab, presence of viral mutation(s) within the areas targeted by this assay, and inadequate number of viral copies(<138 copies/mL). A negative result must be combined with clinical observations, patient history, and epidemiological information. The expected result is Negative.  Fact Sheet for Patients:  EntrepreneurPulse.com.au  Fact Sheet for Healthcare Providers:  IncredibleEmployment.be  This test is no t yet approved or cleared by the [place] FDA and  has been  authorized for detection and/or diagnosis of SARS-CoV-2 by FDA under an Emergency Use Authorization (EUA). This EUA will remain  in effect (meaning this test can be used) for the duration of the COVID-19 declaration under Section 564(b)(1) of the Act, 21 U.S.C.section 360bbb-3(b)(1), unless the authorization is terminated  or revoked sooner.       Influenza A by PCR NEGATIVE NEGATIVE Final   Influenza B by PCR NEGATIVE NEGATIVE Final    Comment: (NOTE) The Xpert Xpress SARS-CoV-2/FLU/RSV plus assay is intended as an aid in the diagnosis of influenza from Nasopharyngeal swab specimens and should not be used as a sole basis for treatment. Nasal washings and aspirates are unacceptable for Xpert Xpress SARS-CoV-2/FLU/RSV testing.  Fact Sheet for Patients: EntrepreneurPulse.com.au  Fact Sheet for Healthcare Providers: IncredibleEmployment.be  This test is not yet approved or cleared by the [place] FDA and has been authorized for detection and/or diagnosis of SARS-CoV-2 by FDA under an Emergency Use Authorization (EUA). This EUA will remain in effect (meaning this test can  be used) for the duration of the COVID-19 declaration under Section 564(b)(1) of the Act, 21 U.S.C. section 360bbb-3(b)(1), unless the authorization is terminated or revoked.  Performed at [facility], [place] [address] [place], [place] [address]          Radiology Studies: DG CHEST PORT 1 VIEW  Result Date: 08/26/2021 CLINICAL DATA:  Dyspnea, nausea EXAM: PORTABLE CHEST 1 VIEW COMPARISON:  CT chest dated August 15, 2021 FINDINGS: The heart size and mediastinal contours are within normal limits. Atherosclerotic calcification of aortic arch. Pacemaker leads terminating in the right atrium, right ventricle and coronary sinus. Low lung volumes. Bilateral hazy lung opacities, which may be secondary to prominence of the vessels due to low lung volumes are  infectious/inflammatory process. No focal consolidation or pleural effusion. The visualized skeletal structures are unremarkable. IMPRESSION: Low lung volumes with hazy lung opacities bilaterally, which may represent prominent vessels due to low lung volumes. Differential includes infectious/inflammatory process. No focal consolidation or large pleural effusion. Electronically Signed   By: Keane Police D.O.   On: 08/26/2021 08:08        Scheduled Meds:  amiodarone  200 mg Oral Daily   aspirin EC  81 mg Oral Daily   enoxaparin (LOVENOX) injection  40 mg Subcutaneous Q24H   feeding supplement (NEPRO CARB STEADY)  237 mL Oral BID BM   levothyroxine  125 mcg Oral Q0600   magnesium oxide  400 mg Oral Daily   pantoprazole (PROTONIX) IV  40 mg Intravenous Daily   rosuvastatin  20 mg Oral Daily   Continuous Infusions:  albumin human Stopped (08/26/21 1359)    Assessment & Plan:   Principal Problem:   Unintentional weight loss Active Problems:   Coronary artery disease involving native coronary artery of native heart without angina pectoris   Chronic combined systolic and diastolic congestive heart failure (HCC)   Elevated CA 19-9 level   Epigastric pain   Mixed hyperlipidemia   Hypothyroidism   Abnormal CT scan, gastrointestinal tract   Esophageal dysphagia   Failure to thrive in adult   1-Unintentional Weight loss//Epigastric pain.  -Patient presented with several month history of epigastric pain radiating to the back, early satiety, generalized weakness and unintentional weight loss. -CT revealing necrotic lymphadenopathy at the GE junction as well as near the head of the pancreas with markedly elevated CA 19-9 level -GI has been consulted plan for endoscopy after Plavix washout for 5 days 2/1 EGD tomorrow 2/2 Continue IV PPI Last dose of plavix 1/28.    2-Chronic combined systolic and diastolic heart failure: Continue amiodarone. Holding coreg due to soft BP ECHO; EF 20-25  % 2/1 on RA . Was given lasix iv x1 yesterday.  Clinically appears more euvolemic.     3-CAD, involving native arteries: Holding Plavix for EGD Currently asymptomatic, chest pain-free   4-Mixed Hyperlipidemia:  Continue statins   5-Hypothyroidism:  Continue with Synthroid   6-Hypotension;  Received fluids.  Recently stop spironolactone.  Carvedilol dose reduce to 3.1 out patient.  Holding in the hospital.  He was orthostatic positive during evaluation with PT . Will avoid IV fluids due to concern earlier for pulmonary edema. SBP above 90. Will order TED hose   Mild transaminases;  Was trending down   Estimated body mass index is 25.84 kg/m as calculated from the following:   Height as of this encounter: 5\' 7"  (1.702 m).   Weight as of this encounter: 74.8 kg.     DVT  prophylaxis: Lovenox Code Status: Full Family Communication: None at bedside Disposition Plan: To be determined Status is: Inpatient Remains inpatient appropriate because: IV treatment.  Needs EGD on Thursday.  Planned Discharge Destination: Home with Home Health              LOS: 2 days   Time spent: 35 minutes with more than 50% on [place], MD [facility] Pager [phone]  If 7PM-7AM, please contact night-coverage 08/27/2021, 8:35 AM

## 2021-08-27 NOTE — Progress Notes (Signed)
Initial Nutrition Assessment  DOCUMENTATION CODES:   Severe malnutrition in context of chronic illness  INTERVENTION:  - Request updated weight - Discontinue Nepro Shakes - Boost Breeze po TID, each supplement provides 250 kcal and 9 grams of protein - MVI with minerals daily  NUTRITION DIAGNOSIS:   Severe Malnutrition related to chronic illness (dysphagia, CAD, CHF, ventricular tachycardia) as evidenced by energy intake < or equal to 75% for > or equal to 1 month, percent weight loss (14).  GOAL:   Patient will meet greater than or equal to 90% of their needs  MONITOR:   PO intake, Supplement acceptance, Diet advancement, Labs, Weight trends  REASON FOR ASSESSMENT:   Consult, Malnutrition Screening Tool Assessment of nutrition requirement/status  ASSESSMENT:   Pt admitted from home with weakness, nausea, and inability to tolerate oral intake secondary to epigastric pain and unintentional weight loss. Pt assessed at [facility] and had CT scan on 1/19 revealing multiple enlarged lymph nodes around GE junction extending toward pancreatic head suspicious for necrosis. Pt being followed by outpatient oncology. PET scan scheduled after d/c to assess for pancreatic/gastric cancer. PMH includes CAD, ICD placement, CHF EF 20-25%, HLD, hypothyroidism, and ventricular tachycardia s/p RFCA.  Pt scheduled for EGD on 2/2 for evaluation of dysphagia and abnormal CT   Pt resting in bed, spoke with pt's wife at bedside. She reports pt has not been able to eat or keep food down for 3 weeks without emesis. Prior to these 3 weeks, since July 2022, he has only been able to eat small bites of food. She could not identify specific foods contributing to PO intolerance. Today, he had grape juice and ginger ale which he was able to tolerate but after having chicken broth, he had an episode of emesis. She states that he has tried Ensure and Nepro shakes but they were too thick and pt was unable to swallow them.  She is agreeable for him to try Boost Breeze. She also reports pt has had difficulty with foods getting stuck during swallowing. Pt being evaluated via CT scan tomorrow for dysphagia.  She states that his weight was 218 lbs in July 2022. Prior to this admission, she reports a weight around 175 lbs and endorses a weight loss of >1 lb per day within the last month d/t inability to eat or keep food down. Per review of chart, noted a 14% weight loss within the last 3 months which is significant for time frame.  Medications: synthroid, mag-ox, protonix  Labs: BUN 7, AST 44, ALT 58  NUTRITION - FOCUSED PHYSICAL EXAM: Deferred to follow up  Diet Order:   Diet Order             Diet NPO time specified  Diet effective 0500 tomorrow           Diet clear liquid Room service appropriate? Yes; Fluid consistency: Thin  Diet effective now                   EDUCATION NEEDS:   Education needs have been addressed  Skin:  Skin Assessment: Reviewed RN Assessment  Last BM:  1/29  Height:   Ht Readings from Last 1 Encounters:  08/24/21 5\' 7"  (1.702 m)    Weight:   Wt Readings from Last 1 Encounters:  08/24/21 74.8 kg   BMI:  Body mass index is 25.84 kg/m.  Estimated Nutritional Needs:   Kcal:  2000-2200  Protein:  100-115g  Fluid:  >/=2.0L  Jerry Farley  Jerry Farley, RDN, LDN Clinical Nutrition

## 2021-08-27 NOTE — Progress Notes (Signed)
PT Cancellation Note  Patient Details Name: SHAHAB POLHAMUS MRN: [id number] DOB: 1945-08-12   Cancelled Treatment:    Reason Eval/Treat Not Completed: Other (comment)  Family coming in for a visit;   Will follow up later today as time allows;  Otherwise, will follow up for PT tomorrow;   Thank you,  Roney Marion, PT  Acute Rehabilitation Services Pager [phone] Office [phone]    Colletta [place] 08/27/2021, 12:21 PM

## 2021-08-28 ENCOUNTER — Inpatient Hospital Stay (HOSPITAL_COMMUNITY): Payer: Medicare HMO | Admitting: Certified Registered"

## 2021-08-28 ENCOUNTER — Encounter (HOSPITAL_COMMUNITY): Payer: Self-pay | Admitting: Internal Medicine

## 2021-08-28 ENCOUNTER — Encounter (HOSPITAL_COMMUNITY)
Admission: EM | Disposition: A | Discharge status: Home Health | Payer: Self-pay | Source: Home / Self Care | Attending: Internal Medicine

## 2021-08-28 DIAGNOSIS — T18128A Food in esophagus causing other injury, initial encounter: Secondary | ICD-10-CM | POA: Diagnosis not present

## 2021-08-28 DIAGNOSIS — D490 Neoplasm of unspecified behavior of digestive system: Secondary | ICD-10-CM | POA: Diagnosis not present

## 2021-08-28 DIAGNOSIS — R1310 Dysphagia, unspecified: Secondary | ICD-10-CM | POA: Diagnosis not present

## 2021-08-28 DIAGNOSIS — R634 Abnormal weight loss: Secondary | ICD-10-CM | POA: Diagnosis not present

## 2021-08-28 HISTORY — PX: FOREIGN BODY REMOVAL: SHX962

## 2021-08-28 HISTORY — PX: ESOPHAGOGASTRODUODENOSCOPY (EGD) WITH PROPOFOL: SHX5813

## 2021-08-28 HISTORY — PX: BIOPSY: SHX5522

## 2021-08-28 SURGERY — ESOPHAGOGASTRODUODENOSCOPY (EGD) WITH PROPOFOL
Anesthesia: Monitor Anesthesia Care

## 2021-08-28 MED ORDER — MIDODRINE HCL 5 MG PO TABS
5.0000 mg | ORAL_TABLET | Freq: Three times a day (TID) | ORAL | Status: DC
  Administered 2021-08-28 – 2021-08-29 (×3): 5 mg via ORAL
  Filled 2021-08-28 (×3): qty 1

## 2021-08-28 MED ORDER — BUTAMBEN-TETRACAINE-BENZOCAINE 2-2-14 % EX AERO
INHALATION_SPRAY | CUTANEOUS | Status: DC | PRN
  Administered 2021-08-28: 2 via TOPICAL

## 2021-08-28 MED ORDER — LACTATED RINGERS IV SOLN: INTRAVENOUS | Status: DC | PRN

## 2021-08-28 MED ORDER — AMISULPRIDE (ANTIEMETIC) 5 MG/2ML IV SOLN: 10.0000 mg | Freq: Once | INTRAVENOUS | Status: DC | PRN

## 2021-08-28 MED ORDER — KETAMINE HCL 50 MG/5ML IJ SOSY
PREFILLED_SYRINGE | INTRAMUSCULAR | Status: AC
  Filled 2021-08-28: qty 5

## 2021-08-28 MED ORDER — PANTOPRAZOLE SODIUM 40 MG PO TBEC
40.0000 mg | DELAYED_RELEASE_TABLET | Freq: Every day | ORAL | Status: DC
  Administered 2021-08-29 – 2021-08-31 (×3): 40 mg via ORAL
  Filled 2021-08-28 (×4): qty 1

## 2021-08-28 MED ORDER — ONDANSETRON HCL 4 MG/2ML IJ SOLN: 4.0000 mg | Freq: Once | INTRAMUSCULAR | Status: DC | PRN

## 2021-08-28 MED ORDER — PROPOFOL 500 MG/50ML IV EMUL
INTRAVENOUS | Status: DC | PRN
  Administered 2021-08-28: 50 ug/kg/min via INTRAVENOUS

## 2021-08-28 MED ORDER — KETAMINE HCL 10 MG/ML IJ SOLN
INTRAMUSCULAR | Status: DC | PRN
Start: 2021-08-28 — End: 2021-08-28
  Administered 2021-08-28 (×3): 10 mg via INTRAVENOUS

## 2021-08-28 MED ORDER — PHENYLEPHRINE HCL-NACL 20-0.9 MG/250ML-% IV SOLN
INTRAVENOUS | Status: DC | PRN
  Administered 2021-08-28: 50 ug/min via INTRAVENOUS

## 2021-08-28 SURGICAL SUPPLY — 15 items

## 2021-08-28 NOTE — Progress Notes (Deleted)
°   08/28/21 1710  Assess: MEWS Score  BP (!) 69/41  Pulse Rate (!) 51  Resp 18  Level of Consciousness Alert  Assess: MEWS Score  MEWS Temp 0  MEWS Systolic 3  MEWS Pulse 0  MEWS RR 0  MEWS LOC 0  MEWS Score 3  MEWS Score Color Yellow  Assess: if the MEWS score is Yellow or Red  Were vital signs taken at a resting state? Yes  Focused Assessment No change from prior assessment  Early Detection of Sepsis Score *See Row Information* Low  MEWS guidelines implemented *See Row Information* No, other (Comment) (this is orthostatic, validation of earlier hypotension)  Treat  MEWS Interventions Other (Comment) (Dr Kurtis Bushman made aware)  Pain Scale 0-10  Pain Score 0  Take Vital Signs  Increase Vital Sign Frequency   (Not applicable)  Escalate  MEWS: Escalate  (Not applicable)  Notify: Charge Nurse/RN  Name of Charge Nurse/RN Notified Nichola RN  Date Charge Nurse/RN Notified 08/28/21  Time Charge Nurse/RN Notified 1714  Notify: Provider  Provider Name/Title Nolberto Hanlon MD  Date Provider Notified 08/28/21  Time Provider Notified 1714  Notification Type Call  Notification Reason Other (Comment) (Orthostatic results)  Date of Provider Response 08/28/21  Time of Provider Response 6144  Notify: Rapid Response  Name of Rapid Response RN Notified Not applicable  Document  Progress note created (see row info) Yes   This was for orthostatic BP purposes.  Dr. Kurtis Bushman aware of pt's condition.

## 2021-08-28 NOTE — Transfer of Care (Signed)
Immediate Anesthesia Transfer of Care Note  Patient: Jerry Farley  Procedure(s) Performed: ESOPHAGOGASTRODUODENOSCOPY (EGD) WITH PROPOFOL FOREIGN BODY REMOVAL BIOPSY  Patient Location: PACU  Anesthesia Type:MAC  Level of Consciousness: awake, alert  and sedated  Airway & Oxygen Therapy: Patient connected to nasal cannula oxygen  Post-op Assessment: Post -op Vital signs reviewed and stable  Post vital signs: stable  Last Vitals:  Vitals Value Taken Time  BP 128/115 08/28/21 0821  Temp    Pulse 76 08/28/21 0821  Resp 27 08/28/21 0821  SpO2 98 % 08/28/21 0821  Vitals shown include unvalidated device data.  Last Pain:  Vitals:   08/28/21 0706  TempSrc: Temporal  PainSc: 0-No pain      Patients Stated Pain Goal: 0 (21/30/86 5784)  Complications: No notable events documented.

## 2021-08-28 NOTE — Progress Notes (Signed)
Physical Therapy Treatment Patient Details Name: Jerry Farley MRN: [id number] DOB: 1946/06/22 Today's Date: 08/28/2021   History of Present Illness 76 year old male who presents to [facility] emergency department with inability to tolerate oral intake, and unintentional weight loss;  with past medical history of coronary artery disease (PCI to 1st OM, last cath 01/2019), ICD placement (originally medtronic, St. Jude replacement 85/2778), systolic and diastolic congestive heart failure (Echo 07/2021 EF 20-25% with G1DD), hyperlipidemia , hypothyroidism and ventricular tachycardia status post RFCA 01/2021    PT Comments    Continuing work on functional mobility and activity tolerance;  Session focused on gentle progression of walking, and watching vitals' response to activity; Noting quite a significant BP drop with upright activity, when asked about symptoms, pt reports he feels "a little hazy"; Notified RN and MD; It seems his body has adapted to months of decr po intake/likely dehydration; educated pt to sit down if/when he feels dizzy/hazy    08/28/21 1230  Orthostatic Sitting  BP- Sitting 91/50 (MAP 68)  Pulse- Sitting 74  Orthostatic Standing at 0 minutes  BP- Standing at 0 minutes (!) 68/58 (MAP 63)  Pulse- Standing at 0 minutes 81  Orthostatic Standing at 3 minutes  BP- Standing at 3 minutes (!) 54/43 (MAP 49)  Pulse- Standing at 3 minutes 79 (Feels "a little hazy")   Sat immediately after such a low BP in standing;    08/28/21 1235 08/28/21 1238  Orthostatic Sitting  BP- Sitting (!) 87/47 (MAP 58) 93/52 (MAP 64)  Pulse- Sitting 72 (sitting upright, feet down) 72 (Reclined, feet up)     Recommendations for follow up therapy are one component of a multi-disciplinary discharge planning process, led by the attending physician.  Recommendations may be updated based on patient status, additional functional criteria and insurance authorization.  Follow Up  Recommendations  Home health PT     Assistance Recommended at Discharge Intermittent Supervision/Assistance  Patient can return home with the following A little help with walking and/or transfers;Assistance with cooking/housework   Equipment Recommendations  Rolling walker (2 wheels);BSC/3in1 (May already have these)    Recommendations for Other Services       Precautions / Restrictions Precautions Precautions: Fall (reported 1 dizzy episode in the past) Precaution Comments: watch BPs and symptoms Restrictions Weight Bearing Restrictions: No     Mobility  Bed Mobility Overal bed mobility: Modified Independent             General bed mobility comments: HOB elevated and use of bed rail    Transfers Overall transfer level: Needs assistance Equipment used: Rolling walker (2 wheels), 1 person hand held assist Transfers: Sit to/from Stand Sit to Stand: Min guard           General transfer comment: Cues for hand placment, and Cues to self-monitor for activity tolerance     Ambulation/Gait Ambulation/Gait assistance: Min guard (with and without physical contact ) Gait Distance (Feet): 50 Feet Assistive device: Rolling walker (2 wheels) Gait Pattern/deviations: Step-through pattern       General Gait Details: Cues to self-monitor for activity tolerance ; Pt appropriately requested to use the RW for walking   Stairs             Wheelchair Mobility    Modified Rankin (Stroke Patients Only)       Balance     Sitting balance-Leahy Scale: Good       Standing balance-Leahy Scale: Fair  Cognition Arousal/Alertness: Awake/alert Behavior During Therapy: WFL for tasks assessed/performed Overall Cognitive Status: Within Functional Limits for tasks assessed                                          Exercises      General Comments General comments (skin integrity, edema, etc.): See other  note of this date for O2 qualifying walk      Pertinent Vitals/Pain Pain Assessment Pain Assessment: No/denies pain    Home Living                          Prior Function            PT Goals (current goals can now be found in the care plan section) Acute Rehab PT Goals Patient Stated Goal: Figure out the nature of his recent medical problems PT Goal Formulation: With patient Time For Goal Achievement: 09/09/21 Potential to Achieve Goals: Good Progress towards PT goals: Progressing toward goals    Frequency    Min 3X/week      PT Plan Current plan remains appropriate    Co-evaluation              AM-PAC PT "6 Clicks" Mobility   Outcome Measure  Help needed turning from your back to your side while in a flat bed without using bedrails?: None Help needed moving from lying on your back to sitting on the side of a flat bed without using bedrails?: None Help needed moving to and from a bed to a chair (including a wheelchair)?: A Little Help needed standing up from a chair using your arms (e.g., wheelchair or bedside chair)?: A Little Help needed to walk in hospital room?: A Little Help needed climbing 3-5 steps with a railing? : A Little 6 Click Score: 20    End of Session   Activity Tolerance: Other (comment) (limited by BP drop with incr time standing) Patient left: in chair;with call bell/phone within reach;with family/visitor present Nurse Communication: Mobility status;Other (comment) (BPs) PT Visit Diagnosis: Unsteadiness on feet (R26.81);Other abnormalities of gait and mobility (R26.89);Dizziness and giddiness (R42)     Time: 6160-7371 PT Time Calculation (min) (ACUTE ONLY): 27 min  Charges:  $Gait Training: 23-37 mins                     Roney Marion, [place]  Acute Rehabilitation Services Pager [phone] Office [phone]    Colletta Maryland 08/28/2021, 2:18 PM

## 2021-08-28 NOTE — Interval H&P Note (Signed)
History and Physical Interval Note:  08/28/2021 7:48 AM  Jerry Farley  has presented today for surgery, with the diagnosis of Dysphagia.  Imaging concerning for GE junction neoplasm.  The various methods of treatment have been discussed with the patient and family. After consideration of risks, benefits and other options for treatment, the patient has consented to  Procedure(s): ESOPHAGOGASTRODUODENOSCOPY (EGD) WITH PROPOFOL (N/A) as a surgical intervention.  The patient's history has been reviewed, patient examined, no change in status, stable for surgery.  I have reviewed the patient's chart and labs.  Questions were answered to the patient's satisfaction.     Sharyn Creamer

## 2021-08-28 NOTE — Op Note (Signed)
[facility] Patient Name: Jerry Farley Procedure Date : 08/28/2021 MRN: [id number] Attending MD: Georgian Co ,  Date of Birth: [birth year] CSN: [id number] Age: 76 Admit Type: Inpatient Procedure:                Upper GI endoscopy Indications:              Dysphagia, Abnormal CT Providers:                Adline Mango" Scheryl Darter, RN Referring MD:              Medicines:                Monitored Anesthesia Care Complications:            No immediate complications. Estimated Blood Loss:     Estimated blood loss was minimal. Procedure:                Pre-Anesthesia Assessment:                           - Prior to the procedure, a History and Physical                            was performed, and patient medications and                            allergies were reviewed. The patient's tolerance of                            previous anesthesia was also reviewed. The risks                            and benefits of the procedure and the sedation                            options and risks were discussed with the patient.                            All questions were answered, and informed consent                            was obtained. Prior Anticoagulants: The patient has                            taken Plavix (clopidogrel), last dose was 5 days                            prior to procedure. ASA Grade Assessment: III - A                            patient with severe systemic disease. After                            reviewing the risks and benefits, the patient was  deemed in satisfactory condition to undergo the                            procedure.                           After obtaining informed consent, the endoscope was                            passed under direct vision. Throughout the                            procedure, the patient's blood pressure, pulse, and                            oxygen saturations were  monitored continuously. The                            [id number]-H190 ([id number]) Olympus endoscope was introduced                            through the mouth, and advanced to the second part                            of duodenum. The upper GI endoscopy was                            accomplished without difficulty. The patient                            tolerated the procedure well. Scope In: Scope Out: Findings:      Food was found in the lower third of the esophagus. Removal of food was       accomplished with roth net.      A large, fungating, partially circumferential (involving two-thirds of       the lumen circumference) mass with no bleeding and no stigmata of recent       bleeding was found at the gastroesophageal junction. Biopsies were taken       with a cold forceps for histology.      Excessive fluid was found in the gastric body. This was mostly removed       via aspiration.      Localized erythematous mucosa without bleeding was found in the gastric       body. Biopsies were taken with a cold forceps for Helicobacter pylori       testing.      The examined duodenum was normal. Impression:               - Food in the lower third of the esophagus. Removal                            was successful with roth net.                           - Likely malignant gastric tumor at the  gastroesophageal junction. Biopsied.                           - Excessive gastric fluid.                           - Erythematous mucosa in the gastric body. Biopsied.                           - Normal examined duodenum. Recommendation:           - Return patient to hospital ward for ongoing care.                           - It is likely that the gastroesophageal junction                            mass is the cause of the patient's dysphagia.                           - Await pathology results.                           - Recommend full liquid diet.                            - Okay to restart Plavix tomorrow.                           - The findings and recommendations were discussed                            with the patient. Procedure Code(s):        --- Professional ---                           [id number], Esophagogastroduodenoscopy, flexible,                            transoral; with removal of foreign body(s)                           43239, Esophagogastroduodenoscopy, flexible,                            transoral; with biopsy, single or multiple Diagnosis Code(s):        --- Professional ---                           F35.456Y, Food in esophagus causing other injury,                            initial encounter                           D49.0, Neoplasm of unspecified behavior of  digestive system                           K31.89, Other diseases of stomach and duodenum                           R13.10, Dysphagia, unspecified                           R93.3, Abnormal findings on diagnostic imaging of                            other parts of digestive tract CPT copyright 2019 American Medical Association. All rights reserved. The codes documented in this report are preliminary and upon coder review may  be revised to meet current compliance requirements. Sonny Masters "Christia Reading,  08/28/2021 8:21:46 AM Number of Addenda: 0

## 2021-08-28 NOTE — Progress Notes (Signed)
Physical Therapy  Note  (Full note to follow)  SATURATION QUALIFICATIONS: (This note is used to comply with regulatory documentation for home oxygen)  Patient Saturations on Room Air at Rest = 98%  Patient Saturations on Room Air while Ambulating = 93%  Patient does not require supplemental oxygen to maintain oxygen saturations at acceptable, safe levels with physical activity.  Roney Marion, [place]  Acute Rehabilitation Services Pager [phone] Office [phone]

## 2021-08-28 NOTE — Progress Notes (Signed)
PROGRESS NOTE    Jerry Farley  QZR:[id number] DOB: Apr 13, 1946 DOA: 08/24/2021 PCP: Baxter Hire, MD    Brief Narrative:  76 year old past medical history significant for CAD status post PCI cath 02/13/2019, ICD placement, systolic and diastolic heart failure ejection fraction 20 to 25% echo 3/54 grade 1 diastolic dysfunction, hypothyroidism, ventricular tachycardia status post RF CVA 01/2021 who presents to [facility], ED with inability to tolerate oral intake.  Patient has been experiencing difficulty tolerating oral intake over the last several month.  Associated nausea and lack of appetite, ongoing vague abdominal and mid back discomfort.  Report ongoing unintentional 40 pound weight loss.   Patient had a CT scan 1/19 at [facility] ED which revealed multiple enlarged lymph nodes surrounding the gastroesophageal junction extending toward the pancreatic head with central decreased at admission suspicious for necrosis.  Had arrangement to follow-up outpatient with oncology whom the patient saw 1/23 at that time PET scan was recommended which has been ordered but has not yet to be completed.  Tumor marker CEA: normal, markedly elevated CEA 19-9: 765.   Patient presented to the ED for further evaluation due to worsening symptoms.  2/1 EGD planned for Thursday  2/2 s/p EGD today. Pt bp in 60's upon standing.GI recommended full liquid diet.  Consultants:  GI  Procedures:   Antimicrobials:      Subjective: Returned from EGD. Wife at bedside. Has no complaints of n/v/abd pain.     Objective: Vitals:   08/28/21 0854 08/28/21 0906 08/28/21 0921 08/28/21 0930  BP: 99/63 (!) 101/58 106/60 (!) 113/57  Pulse: 77 71 72 74  Resp: (!) 22 (!) 23 (!) 23 (!) 22  Temp:    (!) 97.5 F (36.4 C)  TempSrc:      SpO2: 99% 98% 99% 100%  Weight:      Height:        Intake/Output Summary (Last 24 hours) at 08/28/2021 1526 Last data filed at 08/28/2021 1300 Gross per 24 hour  Intake 980 ml  Output  125 ml  Net 855 ml   Filed Weights   08/24/21 0124  Weight: 74.8 kg    Examination: Nad  , calm Cta no w/r Reg s1/s2 no gallop Soft bening +bs No edema  Awake and alert      Data Reviewed: I have personally reviewed following labs and imaging studies  CBC: Recent Labs  Lab 08/22/21 1912 08/24/21 0212 08/25/21 0350 08/26/21 0656 08/27/21 0413  WBC 8.5 8.2 7.7 7.6 7.5  NEUTROABS  --  5.7 5.6  --   --   HGB 13.7 13.6 11.6* 12.0* 11.9*  HCT 41.2 39.6 34.0* 34.5* 35.6*  MCV 96.7 94.5 95.2 95.0 94.9  PLT 189 147* 136* 139* 562*   Basic Metabolic Panel: Recent Labs  Lab 08/22/21 1912 08/24/21 0212 08/25/21 0350 08/26/21 0656 08/27/21 0413  NA 136 137 137 139 138  K 5.3* 4.6 3.8 3.8 3.9  CL 100 103 106 104 105  CO2 29 26 22 25 24   GLUCOSE 116* 96 78 96 96  BUN 11 10 7* 7* 7*  CREATININE 1.07 0.96 0.85 0.86 0.86  CALCIUM 9.1 8.7* 8.0* 8.4* 8.3*  MG  --   --  2.0  --   --    GFR: Estimated Creatinine Clearance: 69.4 mL/min (by C-G formula based on SCr of 0.86 mg/dL). Liver Function Tests: Recent Labs  Lab 08/22/21 1912 08/24/21 0212 08/25/21 0350  AST 66* 57* 44*  ALT 83*  74* 58*  ALKPHOS 53 55 44  BILITOT 0.9 0.7 0.7  PROT 6.2* 5.6* 4.8*  ALBUMIN 3.3* 3.1* 2.5*   Recent Labs  Lab 08/22/21 1912 08/24/21 0212  LIPASE 31 29   No results for input(s): AMMONIA in the last 168 hours. Coagulation Profile: No results for input(s): INR, PROTIME in the last 168 hours. Cardiac Enzymes: No results for input(s): CKTOTAL, CKMB, CKMBINDEX, TROPONINI in the last 168 hours. BNP (last 3 results) No results for input(s): PROBNP in the last 8760 hours. HbA1C: No results for input(s): HGBA1C in the last 72 hours. CBG: No results for input(s): GLUCAP in the last 168 hours. Lipid Profile: No results for input(s): CHOL, HDL, LDLCALC, TRIG, CHOLHDL, LDLDIRECT in the last 72 hours. Thyroid Function Tests: No results for input(s): TSH, T4TOTAL, FREET4, T3FREE,  THYROIDAB in the last 72 hours. Anemia Panel: No results for input(s): VITAMINB12, FOLATE, FERRITIN, TIBC, IRON, RETICCTPCT in the last 72 hours. Sepsis Labs: No results for input(s): PROCALCITON, LATICACIDVEN in the last 168 hours.  Recent Results (from the past 240 hour(s))  Resp Panel by RT-PCR (Flu A&B, Covid) Nasopharyngeal Swab     Status: None   Collection Time: 08/24/21  2:56 AM   Specimen: Nasopharyngeal Swab; Nasopharyngeal(NP) swabs in vial transport medium  Result Value Ref Range Status   SARS Coronavirus 2 by RT PCR NEGATIVE NEGATIVE Final    Comment: (NOTE) SARS-CoV-2 target nucleic acids are NOT DETECTED.  The SARS-CoV-2 RNA is generally detectable in upper respiratory specimens during the acute phase of infection. The lowest concentration of SARS-CoV-2 viral copies this assay can detect is 138 copies/mL. A negative result does not preclude SARS-Cov-2 infection and should not be used as the sole basis for treatment or other patient management decisions. A negative result may occur with  improper specimen collection/handling, submission of specimen other than nasopharyngeal swab, presence of viral mutation(s) within the areas targeted by this assay, and inadequate number of viral copies(<138 copies/mL). A negative result must be combined with clinical observations, patient history, and epidemiological information. The expected result is Negative.  Fact Sheet for Patients:  EntrepreneurPulse.com.au  Fact Sheet for Healthcare Providers:  IncredibleEmployment.be  This test is no t yet approved or cleared by the [place] FDA and  has been authorized for detection and/or diagnosis of SARS-CoV-2 by FDA under an Emergency Use Authorization (EUA). This EUA will remain  in effect (meaning this test can be used) for the duration of the COVID-19 declaration under Section 564(b)(1) of the Act, 21 U.S.C.section 360bbb-3(b)(1), unless the  authorization is terminated  or revoked sooner.       Influenza A by PCR NEGATIVE NEGATIVE Final   Influenza B by PCR NEGATIVE NEGATIVE Final    Comment: (NOTE) The Xpert Xpress SARS-CoV-2/FLU/RSV plus assay is intended as an aid in the diagnosis of influenza from Nasopharyngeal swab specimens and should not be used as a sole basis for treatment. Nasal washings and aspirates are unacceptable for Xpert Xpress SARS-CoV-2/FLU/RSV testing.  Fact Sheet for Patients: EntrepreneurPulse.com.au  Fact Sheet for Healthcare Providers: IncredibleEmployment.be  This test is not yet approved or cleared by the [place] FDA and has been authorized for detection and/or diagnosis of SARS-CoV-2 by FDA under an Emergency Use Authorization (EUA). This EUA will remain in effect (meaning this test can be used) for the duration of the COVID-19 declaration under Section 564(b)(1) of the Act, 21 U.S.C. section 360bbb-3(b)(1), unless the authorization is terminated or revoked.  Performed at [facility]  [facility], [place] [address], [place], [place] [address]          Radiology Studies: No results found.      Scheduled Meds:  amiodarone  200 mg Oral Daily   aspirin EC  81 mg Oral Daily   enoxaparin (LOVENOX) injection  40 mg Subcutaneous Q24H   feeding supplement  1 Container Oral TID BM   levothyroxine  125 mcg Oral Q0600   magnesium oxide  400 mg Oral Daily   multivitamin with minerals  1 tablet Oral Daily   [START ON 08/29/2021] pantoprazole  40 mg Oral Daily   rosuvastatin  20 mg Oral Daily   Continuous Infusions:  albumin human Stopped (08/26/21 1359)    Assessment & Plan:   Principal Problem:   Unintentional weight loss Active Problems:   Coronary artery disease involving native coronary artery of native heart without angina pectoris   Chronic combined systolic and diastolic congestive heart failure (HCC)   Elevated CA 19-9 level    Epigastric pain   Mixed hyperlipidemia   Hypothyroidism   Abnormal CT scan, gastrointestinal tract   Esophageal dysphagia   Failure to thrive in adult   Protein-calorie malnutrition, severe   1-Unintentional Weight loss//Epigastric pain.  -Patient presented with several month history of epigastric pain radiating to the back, early satiety, generalized weakness and unintentional weight loss. -CT revealing necrotic lymphadenopathy at the GE junction as well as near the head of the pancreas with markedly elevated CA 19-9 level -GI has been consulted plan for endoscopy after Plavix washout for 5 days 2/2 s/p EGD today , found with likely malignant gastric tumor at [place] jxn, bx. Erythmeaous mucosa in gastric body Path pending GI rec. Full liquid diet and resume plavix tomorrow. Continue ppi F/u GI as outpt for path/bx results   2-Chronic combined systolic and diastolic heart failure: Continue amiodarone. Holding coreg due to soft BP ECHO; EF 20-25 % 2/1 on RA . Was given lasix iv x1 yesterday.  Clinically appears more euvolemic.  2/2 does become hypotensive on standing, has orthostatisis.was reported to be asx Will add low dose midodrine as bp on standing from sbp 54-68    3-CAD, involving native arteries: Currently asymptomatic, chest pain-free 2/2 resume plavix tomorrow 2/3   4-Mixed Hyperlipidemia:  Continue statin   5-Hypothyroidism:  Continue with Synthroid   6-Hypotension;  Received fluids.  Recently stop spironolactone.  Carvedilol dose reduce to 3.1 out patient.  Holding in the hospital.  He was orthostatic positive during evaluation with PT . Will avoid IV fluids due to concern earlier for pulmonary edema. SBP above 90. Will order TED hose   Mild transaminases;  Was trending down   Estimated body mass index is 25.84 kg/m as calculated from the following:   Height as of this encounter: 5\' 7"  (1.702 m).   Weight as of this encounter: 74.8 kg.     DVT prophylaxis:  Lovenox Code Status: Full Family Communication: None at bedside Disposition Plan: To be determined Status is: Inpatient Remains inpatient appropriate because: IV treatment.   Has orthostatics with very low sbp on standing. On full liquid diet today. Possible dc in am if stable clinically  Planned Discharge Destination: Home with Home Health              LOS: 3 days   Time spent: 35 minutes with more than 50% on [place], MD [facility] Pager [phone]  If 7PM-7AM, please contact night-coverage  08/28/2021, 3:26 PM

## 2021-08-28 NOTE — Anesthesia Postprocedure Evaluation (Signed)
Anesthesia Post Note  Patient: Jerry Farley  Procedure(s) Performed: ESOPHAGOGASTRODUODENOSCOPY (EGD) WITH PROPOFOL FOREIGN BODY REMOVAL BIOPSY     Patient location during evaluation: PACU Anesthesia Type: MAC Level of consciousness: awake and alert Pain management: pain level controlled Vital Signs Assessment: post-procedure vital signs reviewed and stable Respiratory status: spontaneous breathing, nonlabored ventilation, respiratory function stable and patient connected to nasal cannula oxygen Cardiovascular status: stable and blood pressure returned to baseline Postop Assessment: no apparent nausea or vomiting Anesthetic complications: no   No notable events documented.  Last Vitals:  Vitals:   08/28/21 0921 08/28/21 0930  BP: 106/60 (!) 113/57  Pulse: 72 74  Resp: (!) 23 (!) 22  Temp:  (!) 36.4 C  SpO2: 99% 100%    Last Pain:  Vitals:   08/28/21 0930  TempSrc:   PainSc: 0-No pain                 Effie Berkshire

## 2021-08-28 NOTE — Anesthesia Preprocedure Evaluation (Addendum)
Anesthesia Evaluation  Patient identified by MRN, date of birth, ID band Patient awake    Reviewed: Allergy & Precautions, NPO status , Patient's Chart, lab work & pertinent test results  Airway Mallampati: I  TM Distance: >3 FB Neck ROM: Full    Dental  (+) Teeth Intact, Dental Advisory Given   Pulmonary former smoker,    breath sounds clear to auscultation       Cardiovascular hypertension, + CAD, + Cardiac Stents and +CHF  + Cardiac Defibrillator  Rhythm:Regular Rate:Normal  Echo: 1. Left ventricular ejection fraction, by estimation, is 20 to 25%. The  left ventricle has severely decreased function. The left ventricle  demonstrates global hypokinesis. Left ventricular diastolic parameters are  consistent with Grade I diastolic  dysfunction (impaired relaxation).  2. Right ventricular systolic function is mildly reduced. The right  ventricular size is normal. There is normal pulmonary artery systolic  pressure.  3. The mitral valve is normal in structure. Mild mitral valve  regurgitation. No evidence of mitral stenosis.  4. The aortic valve is tricuspid. Aortic valve regurgitation is mild.  Aortic valve sclerosis/calcification is present, without any evidence of  aortic stenosis.  5. The inferior vena cava is normal in size with greater than 50%  respiratory variability, suggesting right atrial pressure of 3 mmHg.    Neuro/Psych negative neurological ROS  negative psych ROS   GI/Hepatic negative GI ROS, Neg liver ROS,   Endo/Other  Hypothyroidism   Renal/GU negative Renal ROS     Musculoskeletal  (+) Arthritis ,   Abdominal Normal abdominal exam  (+)   Peds  Hematology negative hematology ROS (+)   Anesthesia Other Findings   Reproductive/Obstetrics                            Anesthesia Physical Anesthesia Plan  ASA: 4  Anesthesia Plan: MAC   Post-op Pain Management:     Induction: Intravenous  PONV Risk Score and Plan: 0 and Propofol infusion  Airway Management Planned: Natural Airway  Additional Equipment: None  Intra-op Plan:   Post-operative Plan:   Informed Consent: I have reviewed the patients History and Physical, chart, labs and discussed the procedure including the risks, benefits and alternatives for the proposed anesthesia with the patient or authorized representative who has indicated his/her understanding and acceptance.       Plan Discussed with: CRNA  Anesthesia Plan Comments: (Discussed possibility of awareness due to complexity of his heart condition and limited anesthesia safety profile. He endorses understanding. )       Anesthesia Quick Evaluation

## 2021-08-28 NOTE — Telephone Encounter (Signed)
Pt is still admitted in the hospital.

## 2021-08-28 NOTE — Progress Notes (Signed)
OT Cancellation Note  Patient Details Name: Jerry Farley MRN: [id number] DOB: Aug 21, 1945   Cancelled Treatment:    Reason Eval/Treat Not Completed: Fatigue/lethargy limiting ability to participate;Other (comment) per RN pt still sleepy from endoscopy, will check back as time allows for OT session.   Harley Alto., COTA/L Acute Rehabilitation Services [phone]   Precious Haws 08/28/2021, 11:57 AM

## 2021-08-29 DIAGNOSIS — R634 Abnormal weight loss: Secondary | ICD-10-CM | POA: Diagnosis not present

## 2021-08-29 LAB — SURGICAL PATHOLOGY

## 2021-08-29 MED ORDER — MIDODRINE HCL 5 MG PO TABS
10.0000 mg | ORAL_TABLET | Freq: Three times a day (TID) | ORAL | Status: DC
  Administered 2021-08-29 – 2021-09-01 (×8): 10 mg via ORAL
  Filled 2021-08-29 (×8): qty 2

## 2021-08-29 NOTE — Progress Notes (Signed)
PROGRESS NOTE    Jerry Farley  HYI:[id number] DOB: 04/16/1946 DOA: 08/24/2021 PCP: Baxter Hire, MD    Brief Narrative:  76 year old past medical history significant for CAD status post PCI cath 02/13/2019, ICD placement, systolic and diastolic heart failure ejection fraction 20 to 25% echo 7/86 grade 1 diastolic dysfunction, hypothyroidism, ventricular tachycardia status post RF CVA 01/2021 who presents to [facility], ED with inability to tolerate oral intake.  Patient has been experiencing difficulty tolerating oral intake over the last several month.  Associated nausea and lack of appetite, ongoing vague abdominal and mid back discomfort.  Report ongoing unintentional 40 pound weight loss.   Patient had a CT scan 1/19 at [facility] ED which revealed multiple enlarged lymph nodes surrounding the gastroesophageal junction extending toward the pancreatic head with central decreased at admission suspicious for necrosis.  Had arrangement to follow-up outpatient with oncology whom the patient saw 1/23 at that time PET scan was recommended which has been ordered but has not yet to be completed.  Tumor marker CEA: normal, markedly elevated CEA 19-9: 765.   Patient presented to the ED for further evaluation due to worsening symptoms.  2/1 EGD planned for Thursday  2/2 s/p EGD today. Pt bp in 60's upon standing.GI recommended full liquid diet. 2/3 path returned for adenocarcinoma  Consultants:  GI  Procedures:   Antimicrobials:      Subjective: Still hypotensive on standing, reports mild dizziness. No sob or cp    Objective: Vitals:   08/28/21 0921 08/28/21 0930 08/28/21 2101 08/29/21 0955  BP: 106/60 (!) 113/57 (!) 100/53 (!) 94/51  Pulse: 72 74 74 72  Resp: (!) 23 (!) 22 17 17   Temp:  (!) 97.5 F (36.4 C) 98.4 F (36.9 C) 98.3 F (36.8 C)  TempSrc:   Oral   SpO2: 99% 100% 91%   Weight:      Height:        Intake/Output Summary (Last 24 hours) at 08/29/2021 1401 Last data  filed at 08/29/2021 0800 Gross per 24 hour  Intake 820 ml  Output 0 ml  Net 820 ml   Filed Weights   08/24/21 0124  Weight: 74.8 kg    Examination: Calm, NAD Cta no w/r Reg s1/s2 no gallop Soft benign +bs No edema Aaoxox3  Mood and affect appropriate in current setting      Data Reviewed: I have personally reviewed following labs and imaging studies  CBC: Recent Labs  Lab 08/22/21 1912 08/24/21 0212 08/25/21 0350 08/26/21 0656 08/27/21 0413  WBC 8.5 8.2 7.7 7.6 7.5  NEUTROABS  --  5.7 5.6  --   --   HGB 13.7 13.6 11.6* 12.0* 11.9*  HCT 41.2 39.6 34.0* 34.5* 35.6*  MCV 96.7 94.5 95.2 95.0 94.9  PLT 189 147* 136* 139* 767*   Basic Metabolic Panel: Recent Labs  Lab 08/22/21 1912 08/24/21 0212 08/25/21 0350 08/26/21 0656 08/27/21 0413  NA 136 137 137 139 138  K 5.3* 4.6 3.8 3.8 3.9  CL 100 103 106 104 105  CO2 29 26 22 25 24   GLUCOSE 116* 96 78 96 96  BUN 11 10 7* 7* 7*  CREATININE 1.07 0.96 0.85 0.86 0.86  CALCIUM 9.1 8.7* 8.0* 8.4* 8.3*  MG  --   --  2.0  --   --    GFR: Estimated Creatinine Clearance: 69.4 mL/min (by C-G formula based on SCr of 0.86 mg/dL). Liver Function Tests: Recent Labs  Lab 08/22/21 1912 08/24/21  0212 08/25/21 0350  AST 66* 57* 44*  ALT 83* 74* 58*  ALKPHOS 53 55 44  BILITOT 0.9 0.7 0.7  PROT 6.2* 5.6* 4.8*  ALBUMIN 3.3* 3.1* 2.5*   Recent Labs  Lab 08/22/21 1912 08/24/21 0212  LIPASE 31 29   No results for input(s): AMMONIA in the last 168 hours. Coagulation Profile: No results for input(s): INR, PROTIME in the last 168 hours. Cardiac Enzymes: No results for input(s): CKTOTAL, CKMB, CKMBINDEX, TROPONINI in the last 168 hours. BNP (last 3 results) No results for input(s): PROBNP in the last 8760 hours. HbA1C: No results for input(s): HGBA1C in the last 72 hours. CBG: No results for input(s): GLUCAP in the last 168 hours. Lipid Profile: No results for input(s): CHOL, HDL, LDLCALC, TRIG, CHOLHDL, LDLDIRECT in  the last 72 hours. Thyroid Function Tests: No results for input(s): TSH, T4TOTAL, FREET4, T3FREE, THYROIDAB in the last 72 hours. Anemia Panel: No results for input(s): VITAMINB12, FOLATE, FERRITIN, TIBC, IRON, RETICCTPCT in the last 72 hours. Sepsis Labs: No results for input(s): PROCALCITON, LATICACIDVEN in the last 168 hours.  Recent Results (from the past 240 hour(s))  Resp Panel by RT-PCR (Flu A&B, Covid) Nasopharyngeal Swab     Status: None   Collection Time: 08/24/21  2:56 AM   Specimen: Nasopharyngeal Swab; Nasopharyngeal(NP) swabs in vial transport medium  Result Value Ref Range Status   SARS Coronavirus 2 by RT PCR NEGATIVE NEGATIVE Final    Comment: (NOTE) SARS-CoV-2 target nucleic acids are NOT DETECTED.  The SARS-CoV-2 RNA is generally detectable in upper respiratory specimens during the acute phase of infection. The lowest concentration of SARS-CoV-2 viral copies this assay can detect is 138 copies/mL. A negative result does not preclude SARS-Cov-2 infection and should not be used as the sole basis for treatment or other patient management decisions. A negative result may occur with  improper specimen collection/handling, submission of specimen other than nasopharyngeal swab, presence of viral mutation(s) within the areas targeted by this assay, and inadequate number of viral copies(<138 copies/mL). A negative result must be combined with clinical observations, patient history, and epidemiological information. The expected result is Negative.  Fact Sheet for Patients:  EntrepreneurPulse.com.au  Fact Sheet for Healthcare Providers:  IncredibleEmployment.be  This test is no t yet approved or cleared by the [place] FDA and  has been authorized for detection and/or diagnosis of SARS-CoV-2 by FDA under an Emergency Use Authorization (EUA). This EUA will remain  in effect (meaning this test can be used) for the duration of  the COVID-19 declaration under Section 564(b)(1) of the Act, 21 U.S.C.section 360bbb-3(b)(1), unless the authorization is terminated  or revoked sooner.       Influenza A by PCR NEGATIVE NEGATIVE Final   Influenza B by PCR NEGATIVE NEGATIVE Final    Comment: (NOTE) The Xpert Xpress SARS-CoV-2/FLU/RSV plus assay is intended as an aid in the diagnosis of influenza from Nasopharyngeal swab specimens and should not be used as a sole basis for treatment. Nasal washings and aspirates are unacceptable for Xpert Xpress SARS-CoV-2/FLU/RSV testing.  Fact Sheet for Patients: EntrepreneurPulse.com.au  Fact Sheet for Healthcare Providers: IncredibleEmployment.be  This test is not yet approved or cleared by the [place] FDA and has been authorized for detection and/or diagnosis of SARS-CoV-2 by FDA under an Emergency Use Authorization (EUA). This EUA will remain in effect (meaning this test can be used) for the duration of the COVID-19 declaration under Section 564(b)(1) of the Act, 21 U.S.C. section 360bbb-3(b)(1),  unless the authorization is terminated or revoked.  Performed at [facility], [place] [address], [place], [place] [address]          Radiology Studies: No results found.      Scheduled Meds:  amiodarone  200 mg Oral Daily   aspirin EC  81 mg Oral Daily   enoxaparin (LOVENOX) injection  40 mg Subcutaneous Q24H   feeding supplement  1 Container Oral TID BM   levothyroxine  125 mcg Oral Q0600   magnesium oxide  400 mg Oral Daily   midodrine  10 mg Oral TID WC   multivitamin with minerals  1 tablet Oral Daily   pantoprazole  40 mg Oral Daily   rosuvastatin  20 mg Oral Daily   Continuous Infusions:  albumin human Stopped (08/26/21 1359)    Assessment & Plan:   Principal Problem:   Unintentional weight loss Active Problems:   Coronary artery disease involving native coronary artery of native heart without angina  pectoris   Chronic combined systolic and diastolic congestive heart failure (HCC)   Elevated CA 19-9 level   Epigastric pain   Mixed hyperlipidemia   Hypothyroidism   Abnormal CT scan, gastrointestinal tract   Esophageal dysphagia   Failure to thrive in adult   Protein-calorie malnutrition, severe   1-Unintentional Weight loss//Epigastric pain.  -Patient presented with several month history of epigastric pain radiating to the back, early satiety, generalized weakness and unintentional weight loss. -CT revealing necrotic lymphadenopathy at the GE junction as well as near the head of the pancreas with markedly elevated CA 19-9 level -GI has been consulted plan for endoscopy after Plavix washout for 5 days 2/2 s/p EGD today , found with likely malignant gastric tumor at [place] jxn, bx. Erythmeaous mucosa in gastric body Path pending GI rec. Full liquid diet and resume plavix tomorrow. Continue ppi 2/3 path report with adenocarcinoma Will consult oncology Per wife he was suppose to get PET scan as outpatient at [facility]   2-Chronic combined systolic and diastolic heart failure: Continue amiodarone. Holding coreg due to soft BP ECHO; EF 20-25 % 2/1 on RA . Was given lasix iv x1 yesterday.  Clinically appears more euvolemic.  2/2 does become hypotensive on standing, has orthostatisis.was reported to be asx 2/3 euvolemic. Bp low on standing and sx.  Will increase midodrine to 10mg  tid    3-CAD, involving native arteries: Currently asymptomatic, chest pain-free 2/3 will resume plavix , but would like to see oncologys input prior to resuming   4-Mixed Hyperlipidemia:  Continue statin   5-Hypothyroidism:  Continue with Synthroid   6-Hypotension;  Received fluids.  Recently stop spironolactone.  Carvedilol dose reduce to 3.1 out patient.  Holding in the hospital.  He was orthostatic positive during evaluation with PT . Will avoid IV fluids due to concern earlier for pulmonary edema. SBP  above 90. Will order TED hose   Mild transaminases;  Was trending down   Estimated body mass index is 25.84 kg/m as calculated from the following:   Height as of this encounter: 5\' 7"  (1.702 m).   Weight as of this encounter: 74.8 kg.     DVT prophylaxis: Lovenox Code Status: Full Family Communication: updated  wife Disposition Plan: To be determined Status is: Inpatient Remains inpatient appropriate because: IV treatment.   Has orthostatics with very low sbp on standing and sx. Once this is stable can dc. Also consulted oncology.   Planned Discharge Destination: Home with Home Health  LOS: 4 days   Time spent: 35 minutes with more than 50% on COC    Nolberto Hanlon, MD [facility] Pager [phone]  If 7PM-7AM, please contact night-coverage 08/29/2021, 2:01 PM

## 2021-08-29 NOTE — Plan of Care (Signed)
?  Problem: Clinical Measurements: ?Goal: Ability to maintain clinical measurements within normal limits will improve ?Outcome: Not Progressing ?  ?

## 2021-08-29 NOTE — Progress Notes (Signed)
Occupational Therapy Treatment Patient Details Name: Jerry Farley MRN: [id number] DOB: February 21, 1946 Today's Date: 08/29/2021   History of present illness 76 year old male who presents to [facility] emergency department with inability to tolerate oral intake, and unintentional weight loss;  with past medical history of coronary artery disease (PCI to 1st OM, last cath 01/2019), ICD placement (originally medtronic, [employer] Jude replacement 40/9811), systolic and diastolic congestive heart failure (Echo 07/2021 EF 20-25% with G1DD), hyperlipidemia , hypothyroidism and ventricular tachycardia status post RFCA 01/2021   OT comments  Session focused on progression of ADLs/mobility with continued BP assessment. Pt able to complete in-room mobility and basic UB ADLs standing at sink without physical assist. Pt does require Min A for LB ADLs due to reported stiffness from prolonged time in bed. Wife able to assist with LB dressing during session, as well as assessment of compression stockings impact with activity. Due to pt reported 24/7 support at home and adequate physical assist for ADLs/IADLs, updated DC recs to no OT follow-up. Plan to further address dynamic standing balance with obtaining ADL/IADL items to decrease fall risk at home.   BP supine: 90/55  BP sitting: 68/58 BP after sitting 5 min and donning stockings: 91/50 BP after standing activities: 84/49  Pt asymptomatic and reports BP normally low in AM, progressively improving throughout the day with activity.   Recommendations for follow up therapy are one component of a multi-disciplinary discharge planning process, led by the attending physician.  Recommendations may be updated based on patient status, additional functional criteria and insurance authorization.    Follow Up Recommendations  No OT follow up    Assistance Recommended at Discharge Set up Supervision/Assistance  Patient can return home with the following  A little help  with bathing/dressing/bathroom;Assistance with cooking/housework;Assist for transportation   Equipment Recommendations  None recommended by OT    Recommendations for Other Services      Precautions / Restrictions Precautions Precautions: Fall Precaution Comments: watch BPs and symptoms Restrictions Weight Bearing Restrictions: No       Mobility Bed Mobility Overal bed mobility: Modified Independent             General bed mobility comments: HOB elevated and use of bed rail    Transfers Overall transfer level: Modified independent Equipment used: Rolling walker (2 wheels), 1 person hand held assist Transfers: Sit to/from Stand Sit to Stand: Modified independent (Device/Increase time)                 Balance Overall balance assessment: Needs assistance Sitting-balance support: No upper extremity supported Sitting balance-Leahy Scale: Good       Standing balance-Leahy Scale: Fair                             ADL either performed or assessed with clinical judgement   ADL Overall ADL's : Needs assistance/impaired Eating/Feeding: Independent   Grooming: Set up;Standing;Wash/dry face;Wash/dry hands;Brushing hair Grooming Details (indicate cue type and reason): able to stand > 5 min for this task, no LOB or safety concerns             Lower Body Dressing: Minimal assistance;Sit to/from stand;Sitting/lateral leans Lower Body Dressing Details (indicate cue type and reason): difficulty reaching socks likely due to stiffness. Wife assisted as needed. OT assisted with compression stockings             Functional mobility during ADLs: Supervision/safety;Rolling walker (2 wheels) General ADL Comments:  Pt physically moving well, discussed safety with transitional movements, trial of compression stockings OOB (and noted RN giving meds to assist in raising BP prior to session). Wife reports confident in ability to assist as needed; reports someone  always home with pt    Extremity/Trunk Assessment Upper Extremity Assessment Upper Extremity Assessment: Overall WFL for tasks assessed   Lower Extremity Assessment Lower Extremity Assessment: Defer to PT evaluation        Vision   Vision Assessment?: No apparent visual deficits   Perception     Praxis      Cognition Arousal/Alertness: Awake/alert Behavior During Therapy: WFL for tasks assessed/performed Overall Cognitive Status: Within Functional Limits for tasks assessed                                          Exercises      Shoulder Instructions       General Comments Reports running low in the AM (90s/50s) but improves with activity during the day    Pertinent Vitals/ Pain       Pain Assessment Pain Assessment: No/denies pain  Home Living                                          Prior Functioning/Environment              Frequency  Min 2X/week        Progress Toward Goals  OT Goals(current goals can now be found in the care plan section)  Progress towards OT goals: Progressing toward goals  Acute Rehab OT Goals Patient Stated Goal: go home soon OT Goal Formulation: With patient Time For Goal Achievement: 09/09/21 Potential to Achieve Goals: Good ADL Goals Pt Will Perform Grooming: with modified independence;standing Pt Will Perform Upper Body Bathing: with modified independence;sitting Pt Will Perform Lower Body Bathing: with modified independence;sit to/from stand Pt Will Perform Tub/Shower Transfer: with modified independence;ambulating  Plan Discharge plan needs to be updated    Co-evaluation                 AM-PAC OT "6 Clicks" Daily Activity     Outcome Measure   Help from another person eating meals?: None Help from another person taking care of personal grooming?: A Little Help from another person toileting, which includes using toliet, bedpan, or urinal?: A Little Help from another  person bathing (including washing, rinsing, drying)?: A Little Help from another person to put on and taking off regular upper body clothing?: None Help from another person to put on and taking off regular lower body clothing?: A Little 6 Click Score: 20    End of Session Equipment Utilized During Treatment: Gait belt;Rolling walker (2 wheels)  OT Visit Diagnosis: Unsteadiness on feet (R26.81);Other abnormalities of gait and mobility (R26.89);Muscle weakness (generalized) (M62.81)   Activity Tolerance Patient tolerated treatment well   Patient Left in bed;with call bell/phone within reach;with bed alarm set   Nurse Communication Mobility status;Other (comment) (BP)        Time: 0867-6195 OT Time Calculation (min): 30 min  Charges: OT General Charges $OT Visit: 1 Visit OT Treatments $Self Care/Home Management : 8-22 mins $Therapeutic Activity: 8-22 mins  Jerry Farley, OTR/L Acute Rehab Services Office: [phone]   Jerry Farley 08/29/2021, 8:45 AM

## 2021-08-29 NOTE — Care Management Important Message (Signed)
Important Message  Patient Details  Name: Jerry Farley MRN: [id number] Date of Birth: [birth year]   Medicare Important Message Given:  Yes     Orbie Pyo 08/29/2021, 2:15 PM

## 2021-08-29 NOTE — Telephone Encounter (Signed)
Per answering service, Wife called and left a VM She is on her way to hospital to get an update on Jerry Farley.

## 2021-08-29 NOTE — Consult Note (Addendum)
[place]  [phone] [phone]   I saw the patient, examined him and discussed treatment recommendation with the patient and his wife  MEDICAL ONCOLOGY - INITIAL CONSULTATION  Referral MD: Dr. 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 Tandy is a 76 year old male with a past medical history significant for CAD, ICD placement, systolic and diastolic CHF, hyperlipidemia, hypothyroidism status post RCA in July 2022.  He presented to the emergency department with inability to tolerate oral intake.  He has been having a several month history with difficulty tolerating oral intake.  He has been having nausea and lack of appetite as well as vague abdominal and mid back discomfort.  He has had an unintentional weight loss of about 40 pounds with progressively worsening weakness and difficulty ambulating.  He was seen in the emergency department at [facility] on 08/14/2021 due to abdominal pain and weight loss.  He had a CT of the chest/abdomen/pelvis with contrast performed on 08/15/2021 which showed multiple lymph nodes surrounding the gastroesophageal junction, extending towards the pancreatic head, involving the portacaval space and preportal region as well as the intra aortocaval and periaortic and right iliac chain.  A definitive primary neoplasm was not identified on the scan and PET/CT as well as tissue sampling are recommended.  He was referred to medical oncology as an outpatient.  He was seen at the cancer center at [facility] on 08/18/2021 and additional work-up was recommended including a PET scan.  The PET scan has not yet been performed.  Tumor markers were obtained on that day including a CEA which was normal at 4.5 and CA 19.9 which was elevated at 765.  Due to worsening symptoms, the patient presented to the emergency department for expedited work-up.  The patient was seen by GI this admission and underwent upper endoscopy on  08/28/2021.  There was a likely malignant gastric tumor at the GE junction which was biopsied.  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  He is lying in bed.  He reports that he has been able to get up to the chair and to the bathroom.  He reports some mild dizziness/lightheadedness when standing initially but this resolves quickly.  He reports that he has some sensation of food getting stuck when he tries to swallow.  His wife reports that he was bringing up a lot of thick saliva trying to eat, but this has improved with addition of antiemetics.  He is currently tolerating a full liquid diet.  He has no nausea or vomiting other than bringing up thick saliva.  He has not had any chest pain, shortness of breath, abdominal pain.  Bowels moved yesterday.  The patient is married and has 1 child.  He lives in [place], [place].  He has remote history of tobacco use.  Denies alcohol use.  Family history significant for a mother with breast cancer.  Medical oncology was asked see the patient for recommendations regarding his newly diagnosed GE junction adenocarcinoma.   Past Medical History:  Diagnosis Date   6948-lead    Arthritis    "maybe a little"   CAD (coronary artery disease) 01/2006   CFX stent (patent Sept '07)   Cardiac defibrillator CRT-medtronic 13/02/6577   Chronic systolic congestive heart failure (HCC)    Complication of anesthesia    "problems waking up w/valium"   High cholesterol  Hypertension    Hypothyroidism    ICD (implantable cardiac defibrillator) in place 2006, 2013   Medtronic Protecta XT-DR CRT (01/2012)   NICM (nonischemic cardiomyopathy) ([place])    ejection fraction 40-45% by echo July 2012   Non-ischemic cardiomyopathy- EF 40-45% 2D 7/14 02/11/2012   Obesity (BMI 30-39.9) 08/22/2013   Ventricular tachycardia, non-sustained 2007   Amiodarone  :   Past Surgical  History:  Procedure Laterality Date   BI-VENTRICULAR IMPLANTABLE CARDIOVERTER DEFIBRILLATOR UPGRADE N/A 02/10/2012   Procedure: BI-VENTRICULAR IMPLANTABLE CARDIOVERTER DEFIBRILLATOR UPGRADE;  Surgeon: Deboraha Sprang, MD;  Location: [facility] CATH LAB;  Service: Cardiovascular;  Laterality: N/A;   BIV ICD GENERATOR CHANGEOUT N/A 06/24/2018   Procedure: BIV ICD GENERATOR CHANGEOUT;  Surgeon: Deboraha Sprang, MD;  Location: [place] CV LAB;  Service: Cardiovascular;  Laterality: N/A;   CARDIAC CATHETERIZATION  11/30/2001   non-ischemic cardiomyopahty with high LV end-diastolic pressure; 73% distal L main stenosis, large osital stenosis 70%, 70% Cfx stenosis (Dr. Domenic Moras)   CARDIAC CATHETERIZATION  9/07   CFX stent patent [facility])   CARDIAC DEFIBRILLATOR PLACEMENT  09/2004; 02/10/12   ICD implanted in 2006 (Dr. Rito Ehrlich); EOL generator change in 01/2012 - CRT (BiV ICD) (Dr. Caryl Comes)   [place]   01/2006   CFX    LEFT HEART CATH AND CORONARY ANGIOGRAPHY N/A 04/13/2019   Procedure: LEFT HEART CATH AND CORONARY ANGIOGRAPHY;  Surgeon: Jettie Booze, MD;  Location: [place] CV LAB;  Service: Cardiovascular;  Laterality: N/A;   NM MYOCAR PERF EJECTION FRACTION  05/2010   dipyridamole myoview; mod perfusion defect in apical, basal inferior, mid inferior & apical inferior regions (infarct/scar); post-stress 28%, low risk scan   TONSILLECTOMY AND ADENOIDECTOMY     "when I was a child"   TRANSTHORACIC ECHOCARDIOGRAM  01/2013   EF 40-45%, LV mildly dilated, mild LVH, mod hypokinesis of inferior myocardium, grade 1 diastolic dysfunction; mild MR   V TACH ABLATION N/A 02/13/2021   Procedure: V TACH ABLATION;  Surgeon: Evans Lance, MD;  Location: [place] CV LAB;  Service: Cardiovascular;  Laterality: N/A;  :   Current Facility-Administered Medications  Medication Dose Route Frequency Provider Last Rate Last Admin   acetaminophen (TYLENOL) tablet 650 mg  650 mg Oral Q6H  PRN Vernelle Emerald, MD   650 mg at 08/29/21 0450   Or   acetaminophen (TYLENOL) suppository 650 mg  650 mg Rectal Q6H PRN Shalhoub, Sherryll Burger, MD       albumin human 25 % solution 25 g  25 g Intravenous Once Niel Hummer A, MD   Held at 08/26/21 1359   amiodarone (PACERONE) tablet 200 mg  200 mg Oral Daily Shalhoub, Sherryll Burger, MD   200 mg at 08/29/21 1035   aspirin EC tablet 81 mg  81 mg Oral Daily Wouk, Ailene Rud, MD   81 mg at 08/29/21 1035   enoxaparin (LOVENOX) injection 40 mg  40 mg Subcutaneous Q24H Regalado, Belkys A, MD   40 mg at 08/28/21 1643   feeding supplement (BOOST / RESOURCE BREEZE) liquid 1 Container  1 Container Oral TID BM Nolberto Hanlon, MD   1 Container at 08/28/21 1439   levothyroxine (SYNTHROID) tablet 125 mcg  125 mcg Oral Q0600 Vernelle Emerald, MD   125 mcg at 08/29/21 0759   magnesium oxide (MAG-OX) tablet 400 mg  400 mg Oral Daily Vernelle Emerald, MD   400 mg at 08/29/21 1035  midodrine (PROAMATINE) tablet 5 mg  5 mg Oral TID WC Nolberto Hanlon, MD   5 mg at 08/29/21 1213   multivitamin with minerals tablet 1 tablet  1 tablet Oral Daily Nolberto Hanlon, MD   1 tablet at 08/29/21 1035   ondansetron (ZOFRAN) tablet 4 mg  4 mg Oral Q6H PRN Vernelle Emerald, MD       Or   ondansetron [facility]) injection 4 mg  4 mg Intravenous Q6H PRN Shalhoub, Sherryll Burger, MD   4 mg at 08/28/21 1642   oxyCODONE-acetaminophen (PERCOCET/ROXICET) 5-325 MG per tablet 1 tablet  1 tablet Oral Q4H PRN Vernelle Emerald, MD   1 tablet at 08/25/21 0131   pantoprazole (PROTONIX) EC tablet 40 mg  40 mg Oral Daily Nolberto Hanlon, MD   40 mg at 08/29/21 1035   polyethylene glycol (MIRALAX / GLYCOLAX) packet 17 g  17 g Oral Daily PRN Shalhoub, Sherryll Burger, MD       rosuvastatin (CRESTOR) tablet 20 mg  20 mg Oral Daily Shalhoub, Sherryll Burger, MD   20 mg at 08/29/21 1035      Allergies  Allergen Reactions   Contrast Media [Iodinated Contrast Media] Other (See Comments)    Patient states "I don't  remember exactly what happens but I know I cannot have contrast for testing."   :   Family History  Problem Relation Age of Onset   Cancer Mother    Cancer Brother   :   Social History   Socioeconomic History   Marital status: Married    Spouse name: Not on file   Number of children: Not on file   Years of education: Not on file   Highest education level: Not on file  Occupational History   Occupation: retired -- [employer]  Tobacco Use   Smoking status: Former    Packs/day: 1.50    Years: 42.00    Pack years: 63.00    Types: Cigarettes    Quit date: 07/28/2003    Years since quitting: 18.1   Smokeless tobacco: Former    Types: Nurse, children's Use: Never used  Substance and Sexual Activity   Alcohol use: No   Drug use: No   Sexual activity: Never  Other Topics Concern   Not on file  Social History Narrative   Not on file   Social Determinants of Health   Financial Resource Strain: Not on file  Food Insecurity: Not on file  Transportation Needs: Not on file  Physical Activity: Not on file  Stress: Not on file  Social Connections: Not on file  Intimate Partner Violence: Not on file  :  Review of Systems: A comprehensive 14 point review of systems was negative except as noted in the HPI.  Exam: Patient Vitals for the past 24 hrs:  BP Temp Temp src Pulse Resp SpO2  08/29/21 0955 (!) 94/51 98.3 F (36.8 C) -- 72 17 --  08/28/21 2101 (!) 100/53 98.4 F (36.9 C) Oral 74 17 91 %    General: Awake and alert, no distress. Eyes:  no scleral icterus.   ENT:  There were no oropharyngeal lesions.   Lymphatics:  Negative cervical, supraclavicular or axillary adenopathy.   Respiratory: lungs were clear bilaterally without wheezing or crackles.   Cardiovascular:  Regular rate and rhythm, S1/S2, without murmur, rub or gallop.  There was no pedal edema.   GI:  abdomen was soft, flat, nontender, nondistended, without organomegaly.  Skin exam was without  echymosis, petichae.   Neuro exam was nonfocal. Patient was alert and oriented.  Attention was good.   Language was appropriate.  Mood was normal without depression.  Speech was not pressured.  Thought content was not tangential.     Lab Results  Component Value Date   WBC 7.5 08/27/2021   HGB 11.9 (L) 08/27/2021   HCT 35.6 (L) 08/27/2021   PLT 138 (L) 08/27/2021   GLUCOSE 96 08/27/2021   CHOL 132 07/14/2017   TRIG 91 07/14/2017   HDL 34 (L) 07/14/2017   LDLDIRECT 85.0 12/19/2014   LDLCALC 80 07/14/2017   ALT 58 (H) 08/25/2021   AST 44 (H) 08/25/2021   NA 138 08/27/2021   K 3.9 08/27/2021   CL 105 08/27/2021   CREATININE 0.86 08/27/2021   BUN 7 (L) 08/27/2021   CO2 24 08/27/2021    CT CHEST ABDOMEN PELVIS W CONTRAST  Result Date: 08/15/2021 CLINICAL DATA:  Unintended weight loss with nausea, initial encounter EXAM: CT CHEST, ABDOMEN, AND PELVIS WITH CONTRAST TECHNIQUE: Multidetector CT imaging of the chest, abdomen and pelvis was performed following the standard protocol during bolus administration of intravenous contrast. RADIATION DOSE REDUCTION: This exam was performed according to the departmental dose-optimization program which includes automated exposure control, adjustment of the mA and/or kV according to patient size and/or use of iterative reconstruction technique. CONTRAST:  161mL OMNIPAQUE IOHEXOL 300 MG/ML SOLN. Patient was pre-medicated with 4 hour emergency premedication COMPARISON:  None. FINDINGS: CT CHEST FINDINGS Cardiovascular: Thoracic aorta demonstrates atherosclerotic calcifications without aneurysmal dilatation. Coronary calcifications are seen. No cardiac enlargement is noted. Pulmonary artery as visualized is within normal limits. Defibrillator device is noted. Mediastinum/Nodes: Thoracic inlet is within normal limits. There is a 12 mm short axis lymph node identified in the right paratracheal region. No other sizable hilar or mediastinal adenopathy is noted. The  esophagus appears within normal limits. Lungs/Pleura: Lungs are well aerated bilaterally and demonstrate emphysematous change. No focal confluent infiltrate or sizable effusion is seen. Mild tree-in-bud changes are noted throughout both lungs likely related to atypical pneumonia. Musculoskeletal: Degenerative changes of the thoracic spine are noted. No acute rib abnormality is seen. CT ABDOMEN PELVIS FINDINGS Hepatobiliary: Liver demonstrates some scattered calcifications likely related to granulomas. Mild periportal edema is noted which may be related volume overload or hepatic inflammatory change. Gallbladder is well distended with dependent gallstones. No wall thickening or pericholecystic fluid is noted. Pancreas: Unremarkable. No pancreatic ductal dilatation or surrounding inflammatory changes. Spleen: Normal in size without focal abnormality. Adrenals/Urinary Tract: Adrenal glands are within normal limits. Kidneys demonstrate a normal enhancement pattern bilaterally. Small cyst is noted in the lower pole of right kidney. No calculi or obstructive changes are seen. The bladder is within normal limits. Stomach/Bowel: Diverticular change of the colon is noted. No evidence of diverticulitis is seen. No obstructive or inflammatory changes are noted. The appendix is not well visualized. No inflammatory changes to suggest appendicitis are noted. The stomach small bowel appear within normal limits. Vascular/Lymphatic: Atherosclerotic calcifications are identified. Scattered Peri aortic lymph nodes are noted with central decreased attenuation consistent with necrosis. The largest of these measures 10 mm in short axis. Adjacent to the pancreatic head and extending superiorly towards the gastroesophageal junction there are multiple areas of fluid attenuation with peripheral enhancement likely related to centrally necrotic lymph nodes. Adjacent to the pancreatic head and portacaval space these measure 16 mm in short axis.  The total size of the area surrounding  the gastroesophageal junction measures 4.8 x 2.5 cm. Additionally some right iliac chain nodes are noted measuring up to 9 mm. Reproductive: Prostate is unremarkable. Other: No abdominal wall hernia or abnormality. No abdominopelvic ascites. Musculoskeletal: No acute or significant osseous findings. IMPRESSION: Multiple lymph nodes are noted surrounding the gastroesophageal junction, extending towards the pancreatic head, involving the portacaval space and pre portal region as well as the intra-aortocaval and periaortic and right iliac chain. These demonstrate central decreased attenuation suspicious for necrosis. A definitive primary neoplasm is not identified on this exam. PET-CT and tissue sampling would be helpful. Diverticulosis without diverticulitis. Cholelithiasis. Tree-in-bud changes within the lungs bilaterally likely related to atypical pneumonia. Electronically Signed   By: Inez Catalina M.D.   On: 08/15/2021 03:01   DG CHEST PORT 1 VIEW  Result Date: 08/26/2021 CLINICAL DATA:  Dyspnea, nausea EXAM: PORTABLE CHEST 1 VIEW COMPARISON:  CT chest dated August 15, 2021 FINDINGS: The heart size and mediastinal contours are within normal limits. Atherosclerotic calcification of aortic arch. Pacemaker leads terminating in the right atrium, right ventricle and coronary sinus. Low lung volumes. Bilateral hazy lung opacities, which may be secondary to prominence of the vessels due to low lung volumes are infectious/inflammatory process. No focal consolidation or pleural effusion. The visualized skeletal structures are unremarkable. IMPRESSION: Low lung volumes with hazy lung opacities bilaterally, which may represent prominent vessels due to low lung volumes. Differential includes infectious/inflammatory process. No focal consolidation or large pleural effusion. Electronically Signed   By: Keane Police D.O.   On: 08/26/2021 08:08   ECHOCARDIOGRAM COMPLETE  Result Date:  08/11/2021    ECHOCARDIOGRAM REPORT   Patient Name:   STEPHANO ARRANTS Date of Exam: 08/11/2021 Medical Rec #:  [id number]       Height:       67.0 in Accession #:    [id number]      Weight:       176.0 lb Date of Birth:  Apr 15, 1946       BSA:          1.915 m Patient Age:    49 years        BP:           101/61 mmHg Patient Gender: M               HR:           75 bpm. Exam Location:  [place] Procedure: 2D Echo, 3D Echo, Cardiac Doppler and Color Doppler Indications:    I47.20 Ventricular tachycardia  History:        Patient has prior history of Echocardiogram examinations, most                 recent 06/27/2015. NICM and CHF, CAD, Defibrillator; Risk                 Factors:HLD.  Sonographer:    Marygrace Drought RCS Referring Phys: [id number] [place]  1. Left ventricular ejection fraction, by estimation, is 20 to 25%. The left ventricle has severely decreased function. The left ventricle demonstrates global hypokinesis. Left ventricular diastolic parameters are consistent with Grade I diastolic dysfunction (impaired relaxation).  2. Right ventricular systolic function is mildly reduced. The right ventricular size is normal. There is normal pulmonary artery systolic pressure.  3. The mitral valve is normal in structure. Mild mitral valve regurgitation. No evidence of mitral stenosis.  4. The aortic valve is tricuspid. Aortic valve regurgitation is mild. Aortic  valve sclerosis/calcification is present, without any evidence of aortic stenosis.  5. The inferior vena cava is normal in size with greater than 50% respiratory variability, suggesting right atrial pressure of 3 mmHg. FINDINGS  Left Ventricle: Left ventricular ejection fraction, by estimation, is 20 to 25%. The left ventricle has severely decreased function. The left ventricle demonstrates global hypokinesis. The left ventricular internal cavity size was normal in size. There is no left ventricular hypertrophy. Left ventricular diastolic  parameters are consistent with Grade I diastolic dysfunction (impaired relaxation). Right Ventricle: The right ventricular size is normal. Right vetricular wall thickness was not well visualized. Right ventricular systolic function is mildly reduced. There is normal pulmonary artery systolic pressure. The tricuspid regurgitant velocity is 1.94 m/s, and with an assumed right atrial pressure of 3 mmHg, the estimated right ventricular systolic pressure is 82.4 mmHg. Left Atrium: Left atrial size was normal in size. Right Atrium: Right atrial size was normal in size. Pericardium: There is no evidence of pericardial effusion. Mitral Valve: The mitral valve is normal in structure. Mild mitral valve regurgitation. No evidence of mitral valve stenosis. Tricuspid Valve: The tricuspid valve is normal in structure. Tricuspid valve regurgitation is trivial. Aortic Valve: The aortic valve is tricuspid. Aortic valve regurgitation is mild. Aortic valve sclerosis/calcification is present, without any evidence of aortic stenosis. Pulmonic Valve: The pulmonic valve was not well visualized. Pulmonic valve regurgitation is trivial. Aorta: The aortic root and ascending aorta are structurally normal, with no evidence of dilitation. Venous: The inferior vena cava is normal in size with greater than 50% respiratory variability, suggesting right atrial pressure of 3 mmHg. IAS/Shunts: The interatrial septum was not well visualized.  LEFT VENTRICLE PLAX 2D LVIDd:         5.00 cm LVIDs:         4.50 cm LV PW:         0.80 cm LV IVS:        0.70 cm LVOT diam:     1.90 cm LV SV:         195 LV SV Index:   102 LVOT Area:     2.84 cm  RIGHT VENTRICLE RV Basal diam:  2.50 cm LEFT ATRIUM         Index LA diam:    4.30 cm 2.25 cm/m  AORTIC VALVE LVOT VTI:    0.688 m MV E velocity: 30.90 cm/s    TRICUSPID VALVE MV A velocity: 7280.00 cm/s  TR Peak grad:   15.1 mmHg MV E/A ratio:  0.00          TR Vmax:        194.00 cm/s                                SHUNTS                              Systemic VTI:  0.69 m                              Systemic Diam: 1.90 cm Oswaldo Milian MD Electronically signed by Oswaldo Milian MD Signature Date/Time: 08/11/2021/4:32:35 PM    Final    CUP PACEART INCLINIC DEVICE CHECK  Result Date: 08/07/2021 CRT-D device check in office. Thresholds and sensing consistent with previous device measurements. Lead impedance  trends stable over time. No mode switch episodes recorded. No ventricular arrhythmia episodes recorded. Patient bi-ventricularly pacing 89% of the time. Device programmed with appropriate safety margins. Heart failure diagnostics reviewed and trends are stable for patient. No changes made this session. Estimated longevity _2.6 years__.  Patient enrolled in remote follow up. Plan to check device remotely in 3 months se note for full discussion suspect the NSVT events are what is reducing his BP%    CT CHEST ABDOMEN PELVIS W CONTRAST  Result Date: 08/15/2021 CLINICAL DATA:  Unintended weight loss with nausea, initial encounter EXAM: CT CHEST, ABDOMEN, AND PELVIS WITH CONTRAST TECHNIQUE: Multidetector CT imaging of the chest, abdomen and pelvis was performed following the standard protocol during bolus administration of intravenous contrast. RADIATION DOSE REDUCTION: This exam was performed according to the departmental dose-optimization program which includes automated exposure control, adjustment of the mA and/or kV according to patient size and/or use of iterative reconstruction technique. CONTRAST:  12mL OMNIPAQUE IOHEXOL 300 MG/ML SOLN. Patient was pre-medicated with 4 hour emergency premedication COMPARISON:  None. FINDINGS: CT CHEST FINDINGS Cardiovascular: Thoracic aorta demonstrates atherosclerotic calcifications without aneurysmal dilatation. Coronary calcifications are seen. No cardiac enlargement is noted. Pulmonary artery as visualized is within normal limits. Defibrillator device is noted.  Mediastinum/Nodes: Thoracic inlet is within normal limits. There is a 12 mm short axis lymph node identified in the right paratracheal region. No other sizable hilar or mediastinal adenopathy is noted. The esophagus appears within normal limits. Lungs/Pleura: Lungs are well aerated bilaterally and demonstrate emphysematous change. No focal confluent infiltrate or sizable effusion is seen. Mild tree-in-bud changes are noted throughout both lungs likely related to atypical pneumonia. Musculoskeletal: Degenerative changes of the thoracic spine are noted. No acute rib abnormality is seen. CT ABDOMEN PELVIS FINDINGS Hepatobiliary: Liver demonstrates some scattered calcifications likely related to granulomas. Mild periportal edema is noted which may be related volume overload or hepatic inflammatory change. Gallbladder is well distended with dependent gallstones. No wall thickening or pericholecystic fluid is noted. Pancreas: Unremarkable. No pancreatic ductal dilatation or surrounding inflammatory changes. Spleen: Normal in size without focal abnormality. Adrenals/Urinary Tract: Adrenal glands are within normal limits. Kidneys demonstrate a normal enhancement pattern bilaterally. Small cyst is noted in the lower pole of right kidney. No calculi or obstructive changes are seen. The bladder is within normal limits. Stomach/Bowel: Diverticular change of the colon is noted. No evidence of diverticulitis is seen. No obstructive or inflammatory changes are noted. The appendix is not well visualized. No inflammatory changes to suggest appendicitis are noted. The stomach small bowel appear within normal limits. Vascular/Lymphatic: Atherosclerotic calcifications are identified. Scattered Peri aortic lymph nodes are noted with central decreased attenuation consistent with necrosis. The largest of these measures 10 mm in short axis. Adjacent to the pancreatic head and extending superiorly towards the gastroesophageal junction there  are multiple areas of fluid attenuation with peripheral enhancement likely related to centrally necrotic lymph nodes. Adjacent to the pancreatic head and portacaval space these measure 16 mm in short axis. The total size of the area surrounding the gastroesophageal junction measures 4.8 x 2.5 cm. Additionally some right iliac chain nodes are noted measuring up to 9 mm. Reproductive: Prostate is unremarkable. Other: No abdominal wall hernia or abnormality. No abdominopelvic ascites. Musculoskeletal: No acute or significant osseous findings. IMPRESSION: Multiple lymph nodes are noted surrounding the gastroesophageal junction, extending towards the pancreatic head, involving the portacaval space and pre portal region as well as the intra-aortocaval and periaortic and right iliac  chain. These demonstrate central decreased attenuation suspicious for necrosis. A definitive primary neoplasm is not identified on this exam. PET-CT and tissue sampling would be helpful. Diverticulosis without diverticulitis. Cholelithiasis. Tree-in-bud changes within the lungs bilaterally likely related to atypical pneumonia. Electronically Signed   By: Inez Catalina M.D.   On: 08/15/2021 03:01   DG CHEST PORT 1 VIEW  Result Date: 08/26/2021 CLINICAL DATA:  Dyspnea, nausea EXAM: PORTABLE CHEST 1 VIEW COMPARISON:  CT chest dated August 15, 2021 FINDINGS: The heart size and mediastinal contours are within normal limits. Atherosclerotic calcification of aortic arch. Pacemaker leads terminating in the right atrium, right ventricle and coronary sinus. Low lung volumes. Bilateral hazy lung opacities, which may be secondary to prominence of the vessels due to low lung volumes are infectious/inflammatory process. No focal consolidation or pleural effusion. The visualized skeletal structures are unremarkable. IMPRESSION: Low lung volumes with hazy lung opacities bilaterally, which may represent prominent vessels due to low lung volumes.  Differential includes infectious/inflammatory process. No focal consolidation or large pleural effusion. Electronically Signed   By: Keane Police D.O.   On: 08/26/2021 08:08   ECHOCARDIOGRAM COMPLETE  Result Date: 08/11/2021    ECHOCARDIOGRAM REPORT   Patient Name:   PAULO KEIMIG Date of Exam: 08/11/2021 Medical Rec #:  [id number]       Height:       67.0 in Accession #:    [id number]      Weight:       176.0 lb Date of Birth:  1946/03/23       BSA:          1.915 m Patient Age:    7 years        BP:           101/61 mmHg Patient Gender: M               HR:           75 bpm. Exam Location:  [facility] Procedure: 2D Echo, 3D Echo, Cardiac Doppler and Color Doppler Indications:    I47.20 Ventricular tachycardia  History:        Patient has prior history of Echocardiogram examinations, most                 recent 06/27/2015. NICM and CHF, CAD, Defibrillator; Risk                 Factors:HLD.  Sonographer:    Marygrace Drought RCS Referring Phys: [id number] [place]  1. Left ventricular ejection fraction, by estimation, is 20 to 25%. The left ventricle has severely decreased function. The left ventricle demonstrates global hypokinesis. Left ventricular diastolic parameters are consistent with Grade I diastolic dysfunction (impaired relaxation).  2. Right ventricular systolic function is mildly reduced. The right ventricular size is normal. There is normal pulmonary artery systolic pressure.  3. The mitral valve is normal in structure. Mild mitral valve regurgitation. No evidence of mitral stenosis.  4. The aortic valve is tricuspid. Aortic valve regurgitation is mild. Aortic valve sclerosis/calcification is present, without any evidence of aortic stenosis.  5. The inferior vena cava is normal in size with greater than 50% respiratory variability, suggesting right atrial pressure of 3 mmHg. FINDINGS  Left Ventricle: Left ventricular ejection fraction, by estimation, is 20 to 25%. The left ventricle  has severely decreased function. The left ventricle demonstrates global hypokinesis. The left ventricular internal cavity size was normal in size. There is no left  ventricular hypertrophy. Left ventricular diastolic parameters are consistent with Grade I diastolic dysfunction (impaired relaxation). Right Ventricle: The right ventricular size is normal. Right vetricular wall thickness was not well visualized. Right ventricular systolic function is mildly reduced. There is normal pulmonary artery systolic pressure. The tricuspid regurgitant velocity is 1.94 m/s, and with an assumed right atrial pressure of 3 mmHg, the estimated right ventricular systolic pressure is 16.1 mmHg. Left Atrium: Left atrial size was normal in size. Right Atrium: Right atrial size was normal in size. Pericardium: There is no evidence of pericardial effusion. Mitral Valve: The mitral valve is normal in structure. Mild mitral valve regurgitation. No evidence of mitral valve stenosis. Tricuspid Valve: The tricuspid valve is normal in structure. Tricuspid valve regurgitation is trivial. Aortic Valve: The aortic valve is tricuspid. Aortic valve regurgitation is mild. Aortic valve sclerosis/calcification is present, without any evidence of aortic stenosis. Pulmonic Valve: The pulmonic valve was not well visualized. Pulmonic valve regurgitation is trivial. Aorta: The aortic root and ascending aorta are structurally normal, with no evidence of dilitation. Venous: The inferior vena cava is normal in size with greater than 50% respiratory variability, suggesting right atrial pressure of 3 mmHg. IAS/Shunts: The interatrial septum was not well visualized.  LEFT VENTRICLE PLAX 2D LVIDd:         5.00 cm LVIDs:         4.50 cm LV PW:         0.80 cm LV IVS:        0.70 cm LVOT diam:     1.90 cm LV SV:         195 LV SV Index:   102 LVOT Area:     2.84 cm  RIGHT VENTRICLE RV Basal diam:  2.50 cm LEFT ATRIUM         Index LA diam:    4.30 cm 2.25 cm/m   AORTIC VALVE LVOT VTI:    0.688 m MV E velocity: 30.90 cm/s    TRICUSPID VALVE MV A velocity: 7280.00 cm/s  TR Peak grad:   15.1 mmHg MV E/A ratio:  0.00          TR Vmax:        194.00 cm/s                               SHUNTS                              Systemic VTI:  0.69 m                              Systemic Diam: 1.90 cm Oswaldo Milian MD Electronically signed by Oswaldo Milian MD Signature Date/Time: 08/11/2021/4:32:35 PM    Final    CUP PACEART INCLINIC DEVICE CHECK  Result Date: 08/07/2021 CRT-D device check in office. Thresholds and sensing consistent with previous device measurements. Lead impedance trends stable over time. No mode switch episodes recorded. No ventricular arrhythmia episodes recorded. Patient bi-ventricularly pacing 89% of the time. Device programmed with appropriate safety margins. Heart failure diagnostics reviewed and trends are stable for patient. No changes made this session. Estimated longevity _2.6 years__.  Patient enrolled in remote follow up. Plan to check device remotely in 3 months se note for full discussion suspect the NSVT events are what is reducing his  BP%   Pathology:  SURGICAL PATHOLOGY  CASE: [id number]  PATIENT: Ashely Tift  Surgical Pathology Report   Clinical History: dysphagia, imaging concerning for GE junction  neoplasm, R/O H. Pylori and dysplasia (cm)   FINAL MICROSCOPIC DIAGNOSIS:   A. STOMACH, BIOPSY:  - Gastric antral mucosa with mild nonspecific reactive gastropathy  - Negative for intestinal metaplasia or dysplasia  - Helicobacter pylori-like organisms are not identified on routine HE  stain   B. GE JUNCTION MASS, BIOPSY:  - Invasive moderate to poorly differentiated adenocarcinoma, arising in  a background of focal intestinal metaplasia with high grade dysplasia   COMMENT:   Dr. Saralyn Pilar reviewed the case and concurs with the diagnosis.  Dr.  Lorenso Courier was notified on 08/29/2021.    Assessment and Plan:   GE  junction adenocarcinoma Reviewed prior imaging results as well as biopsy results with the patient and his wife Biopsy confirmed malignancy Appears to have at least locally advanced malignancy Recommend that he proceed with planned work-up by his primary oncologist including a PET scan as an outpatient-explained to patient and wife that PET scan could not be performed inpatient Will defer to primary oncologist regarding treatment options, but briefly discussed that treatment may include chemotherapy combined with radiation Due to his significant disease involvement and multiple comorbidities, he is not a candidate for surgical resection We discussed general approach with combined modality treatment We will make sure that he has outpatient follow-up with Dr. Rogue Bussing at [facility] following hospital discharge  Dysphagia/weight loss  Secondary to underlying malignancy Tolerating full liquids at this time I would keep on full liquids/soft diet for now Dietitian following May need feeding tube placement if he undergoes concurrent chemoradiation, but will defer to primary oncologist  Anemia/thrombocytopenia Overall, this is mild Monitor  Chronic combined systolic and diastolic heart failure He is on amiodarone and carvedilol being held due to low blood pressure Last LVEF was 20 to 25% on echo from 08/11/2021 He will continue follow-up with cardiology as an outpatient  Hypothyroidism On Synthroid Management per hospitalist  Discharge planning He can be discharged from my standpoint I will reach out to Dr. Rogue Bussing to make sure he has outpatient follow-up along with imaging studies and radiation oncology consultation as indicated I have addressed all his questions and concerns  Thank you for this referral.   Mikey Bussing, DNP, AGPCNP-BC, AOCNP  Heath Lark, MD

## 2021-08-29 NOTE — Progress Notes (Signed)
Physical Therapy Treatment Patient Details Name: Jerry Farley MRN: [id number] DOB: Oct 20, 1945 Today's Date: 08/29/2021   History of Present Illness 76 year old male who presents to [facility] emergency department with inability to tolerate oral intake, and unintentional weight loss;  with past medical history of coronary artery disease (PCI to 1st OM, last cath 01/2019), ICD placement (originally medtronic, [employer] replacement 32/1224), systolic and diastolic congestive heart failure (Echo 07/2021 EF 20-25% with G1DD), hyperlipidemia , hypothyroidism and ventricular tachycardia status post RFCA 01/2021    PT Comments    Patient continues to be limited by +orthostatic and consistently low BP, see below. Patient moving well but unable to progress due to low BP. Anticipate most limiting factor to be activity tolerance once BP stabilizes. Placed TED hose on prior to mobilizing but still BP low in standing. D/c plan remains appropriate.    Orthostatic BPs  Sitting 84/45  Standing 63/53  Standing after 3 min 70/46      Recommendations for follow up therapy are one component of a multi-disciplinary discharge planning process, led by the attending physician.  Recommendations may be updated based on patient status, additional functional criteria and insurance authorization.  Follow Up Recommendations  Home health PT     Assistance Recommended at Discharge Intermittent Supervision/Assistance  Patient can return home with the following A little help with walking and/or transfers;Assistance with cooking/housework   Equipment Recommendations  Rolling Davan Hark (2 wheels);BSC/3in1    Recommendations for Other Services       Precautions / Restrictions Precautions Precautions: Fall Precaution Comments: watch BPs and symptoms Restrictions Weight Bearing Restrictions: No     Mobility  Bed Mobility Overal bed mobility: Modified Independent                  Transfers Overall  transfer level: Modified independent Equipment used: Rolling Jacek Colson (2 wheels) Transfers: Sit to/from Stand, Bed to chair/wheelchair/BSC Sit to Stand: Modified independent (Device/Increase time)   Step pivot transfers: Modified independent (Device/Increase time)            Ambulation/Gait               General Gait Details: deferred due to low BP in standing   Stairs             Wheelchair Mobility    Modified Rankin (Stroke Patients Only)       Balance Overall balance assessment: Mild deficits observed, not formally tested                                          Cognition Arousal/Alertness: Awake/alert Behavior During Therapy: WFL for tasks assessed/performed Overall Cognitive Status: Within Functional Limits for tasks assessed                                          Exercises      General Comments        Pertinent Vitals/Pain Pain Assessment Pain Assessment: No/denies pain    Home Living                          Prior Function            PT Goals (current goals can now be found in the care plan  section) Acute Rehab PT Goals PT Goal Formulation: With patient Time For Goal Achievement: 09/09/21 Potential to Achieve Goals: Good Progress towards PT goals: Progressing toward goals    Frequency    Min 3X/week      PT Plan Current plan remains appropriate    Co-evaluation              AM-PAC PT "6 Clicks" Mobility   Outcome Measure  Help needed turning from your back to your side while in a flat bed without using bedrails?: None Help needed moving from lying on your back to sitting on the side of a flat bed without using bedrails?: None Help needed moving to and from a bed to a chair (including a wheelchair)?: None Help needed standing up from a chair using your arms (e.g., wheelchair or bedside chair)?: None Help needed to walk in hospital room?: A Little Help needed climbing  3-5 steps with a railing? : A Little 6 Click Score: 22    End of Session   Activity Tolerance: Other (comment) (limited by orthostatic BP) Patient left: in chair;with call bell/phone within reach;with family/visitor present Nurse Communication: Mobility status PT Visit Diagnosis: Unsteadiness on feet (R26.81);Other abnormalities of gait and mobility (R26.89);Dizziness and giddiness (R42)     Time: 1526-1600 PT Time Calculation (min) (ACUTE ONLY): 34 min  Charges:  $Therapeutic Activity: 23-37 mins                     Jahleah Mariscal A. Gilford Rile PT, DPT Acute Rehabilitation Services Pager [phone] Office [phone]    Jerry Farley 08/29/2021, 5:22 PM

## 2021-08-29 NOTE — Progress Notes (Addendum)
° °         Daily Rounding Note  08/29/2021, 1:29 PM  LOS: 4 days   SUBJECTIVE:   Chief complaint:   Dysphagia 2/2 esophageal adenocarcinoma  Able to tolerate liquids. Swallowing is about stable  OBJECTIVE:         Vital signs in last 24 hours:    Temp:  [98.3 F (36.8 C)-98.4 F (36.9 C)] 98.3 F (36.8 C) (02/03 0955) Pulse Rate:  [72-74] 72 (02/03 0955) Resp:  [17] 17 (02/03 0955) BP: (94-100)/(51-53) 94/51 (02/03 0955) SpO2:  [91 %] 91 % (02/02 2101) Last BM Date: 08/28/21 Filed Weights   08/24/21 0124  Weight: 74.8 kg   General: pleasant   Heart: regular rate Chest: no increased WOB Abdomen: soft, NT Extremities: no CCE Neuro/Psych:  alert  Intake/Output from previous day: 02/02 0701 - 02/03 0700 In: 1020 [P.O.:1020] Out: 125 [Urine:125]  Intake/Output this shift: Total I/O In: 300 [P.O.:300] Out: -   Lab Results: Recent Labs    08/27/21 0413  WBC 7.5  HGB 11.9*  HCT 35.6*  PLT 138*   BMET Recent Labs    08/27/21 0413  NA 138  K 3.9  CL 105  CO2 24  GLUCOSE 96  BUN 7*  CREATININE 0.86  CALCIUM 8.3*   LFT No results for input(s): PROT, ALBUMIN, AST, ALT, ALKPHOS, BILITOT, BILIDIR, IBILI in the last 72 hours.  PT/INR No results for input(s): LABPROT, INR in the last 72 hours. Hepatitis Panel No results for input(s): HEPBSAG, HCVAB, HEPAIGM, HEPBIGM in the last 72 hours.  Studies/Results: No results found.  ASSESMENT:   Dysphagia 2/2 esophageal adenocarcinoma  Heart failure, EF 20 to 25%, CAD.  Chronic Plavix on hold, history PCI.  Thrombocytopenia, noncritical.  Platelets 138.  No splenomegaly on CT.   PLAN   - Notified the patient of a confirmed diagnosis of esophageal adenocarcinoma based upon GE Junction mass biopsies - Recommend oncology consult. Consider PET scan.  - GI will sign off for now    Sharyn Creamer  08/29/2021, 1:29 PM Phone [phone]

## 2021-08-29 NOTE — Progress Notes (Signed)
MD made aware of patient's positive symptomatic/lightheadedness orthostatic blood pressure.

## 2021-08-30 DIAGNOSIS — R634 Abnormal weight loss: Secondary | ICD-10-CM | POA: Diagnosis not present

## 2021-08-30 LAB — BASIC METABOLIC PANEL
Anion gap: 7 (ref 5–15)
BUN: 7 mg/dL — ABNORMAL LOW (ref 8–23)
CO2: 30 mmol/L (ref 22–32)
Calcium: 8.7 mg/dL — ABNORMAL LOW (ref 8.9–10.3)
Chloride: 101 mmol/L (ref 98–111)
Creatinine, Ser: 0.87 mg/dL (ref 0.61–1.24)
GFR, Estimated: >60 mL/min (ref >60)
Glucose, Bld: 98 mg/dL (ref 70–99)
Potassium: 5 mmol/L (ref 3.5–5.1)
Sodium: 138 mmol/L (ref 135–145)

## 2021-08-30 LAB — MAGNESIUM: Magnesium: 2.2 mg/dL (ref 1.7–2.4)

## 2021-08-30 MED ORDER — LACTATED RINGERS IV BOLUS
250.0000 mL | Freq: Once | INTRAVENOUS | Status: AC
  Administered 2021-08-30: 250 mL via INTRAVENOUS

## 2021-08-30 MED ORDER — CLOPIDOGREL BISULFATE 75 MG PO TABS
75.0000 mg | ORAL_TABLET | Freq: Every day | ORAL | Status: DC
  Administered 2021-08-30 – 2021-09-01 (×3): 75 mg via ORAL
  Filled 2021-08-30 (×3): qty 1

## 2021-08-30 NOTE — Progress Notes (Signed)
Pharmacist Heart Failure Core Measure Documentation  Assessment: Jerry Farley has an EF documented as 20-25% on 08/11/21 by ECHO.  Rationale: Heart failure patients with left ventricular systolic dysfunction (LVSD) and an EF < 40% should be prescribed an angiotensin converting enzyme inhibitor (ACEI) or angiotensin receptor blocker (ARB) at discharge unless a contraindication is documented in the medical record.  This patient is not currently on an ACEI or ARB for HF.  This note is being placed in the record in order to provide documentation that a contraindication to the use of these agents is present for this encounter.  ACE Inhibitor or Angiotensin Receptor Blocker is contraindicated (specify all that apply)  []   ACEI allergy AND ARB allergy []   Angioedema []   Moderate or severe aortic stenosis []   Hyperkalemia [x]   Hypotension []   Renal artery stenosis []   Worsening renal function, preexisting renal disease or dysfunction

## 2021-08-30 NOTE — TOC Transition Note (Signed)
Transition of Care [facility]) - CM/SW Discharge Note   Patient Details  Name: Jerry Farley MRN: [id number] Date of Birth: [birth year]  [facility] CM/SW Contact:  Bartholomew Crews, RN Phone Number: [phone] 08/30/2021, 11:11 AM   Clinical Narrative:     Spoke with patient's spouse on hospital room phone. Patient requested that NCM speak with spouse. Verified that patient did not need RW or 3/1 - he will borrow from family members. Advised of follow up for [facility] - patient not wanting HH at this time. Stated that [facility] was going to see him today, but patient declined the visit. No further TOC needs identified at this time.   Final next level of care: [place] Barriers to Discharge: No Barriers Identified   Patient Goals and CMS Choice Patient states their goals for this hospitalization and ongoing recovery are:: To return home CMS Medicare.gov Compare Post Acute Care list provided to:: Patient    Discharge Placement                       Discharge Plan and Services   Discharge Planning Services: CM Consult                                 Social Determinants of Health (SDOH) Interventions     Readmission Risk Interventions No flowsheet data found.

## 2021-08-30 NOTE — Progress Notes (Signed)
PROGRESS NOTE    Jerry Farley  ZOX:[id number] DOB: 10-03-1945 DOA: 08/24/2021 PCP: Baxter Hire, MD    Brief Narrative:  76 year old past medical history significant for CAD status post PCI cath 02/13/2019, ICD placement, systolic and diastolic heart failure ejection fraction 20 to 25% echo 8/11 grade 1 diastolic dysfunction, hypothyroidism, ventricular tachycardia status post RF CVA 01/2021 who presents to [facility], ED with inability to tolerate oral intake.  Patient has been experiencing difficulty tolerating oral intake over the last several month.  Associated nausea and lack of appetite, ongoing vague abdominal and mid back discomfort.  Report ongoing unintentional 40 pound weight loss.   Patient had a CT scan 1/19 at [facility] ED which revealed multiple enlarged lymph nodes surrounding the gastroesophageal junction extending toward the pancreatic head with central decreased at admission suspicious for necrosis.  Had arrangement to follow-up outpatient with oncology whom the patient saw 1/23 at that time PET scan was recommended which has been ordered but has not yet to be completed.  Tumor marker CEA: normal, markedly elevated CEA 19-9: 765.   Patient presented to the ED for further evaluation due to worsening symptoms.   2/1 EGD planned for Thursday  2/2 s/p EGD today. Pt bp in 60's upon standing.GI recommended full liquid diet. 2/3 path returned for adenocarcinoma 2/4 still very low bp on standing.     Consultants:  GI  Procedures:   Antimicrobials:      Subjective: Saw pt while lying in bed. Has no dizziness, lightheadedness. No sob or cp  Objective: Vitals:   08/30/21 0837 08/30/21 1027 08/30/21 1030 08/30/21 1035  BP: (!) 80/30 (!) 96/50 (!) 91/47 (!) 67/42  Pulse: 73 71 71 82  Resp: 18 18    Temp: 97.8 F (36.6 C) (!) 97.4 F (36.3 C)    TempSrc: Oral     SpO2: 94% 93% 94% 100%  Weight:      Height:        Intake/Output Summary (Last 24 hours) at 08/30/2021  1045 Last data filed at 08/29/2021 1700 Gross per 24 hour  Intake 780 ml  Output --  Net 780 ml   Filed Weights   08/24/21 0124 08/30/21 0513  Weight: 74.8 kg 79.7 kg    Examination: Calm, NAD Cta no w/r Reg s1/s2 no gallop Soft benign +bs No edema Aaoxox3  Mood and affect appropriate in current setting     Data Reviewed: I have personally reviewed following labs and imaging studies  CBC: Recent Labs  Lab 08/24/21 0212 08/25/21 0350 08/26/21 0656 08/27/21 0413  WBC 8.2 7.7 7.6 7.5  NEUTROABS 5.7 5.6  --   --   HGB 13.6 11.6* 12.0* 11.9*  HCT 39.6 34.0* 34.5* 35.6*  MCV 94.5 95.2 95.0 94.9  PLT 147* 136* 139* 914*   Basic Metabolic Panel: Recent Labs  Lab 08/24/21 0212 08/25/21 0350 08/26/21 0656 08/27/21 0413 08/30/21 0048  NA 137 137 139 138 138  K 4.6 3.8 3.8 3.9 5.0  CL 103 106 104 105 101  CO2 26 22 25 24 30   GLUCOSE 96 78 96 96 98  BUN 10 7* 7* 7* 7*  CREATININE 0.96 0.85 0.86 0.86 0.87  CALCIUM 8.7* 8.0* 8.4* 8.3* 8.7*  MG  --  2.0  --   --  2.2   GFR: Estimated Creatinine Clearance: 74.2 mL/min (by C-G formula based on SCr of 0.87 mg/dL). Liver Function Tests: Recent Labs  Lab 08/24/21 [phone] 08/25/21 0350  AST 57* 44*  ALT 74* 58*  ALKPHOS 55 44  BILITOT 0.7 0.7  PROT 5.6* 4.8*  ALBUMIN 3.1* 2.5*   Recent Labs  Lab 08/24/21 0212  LIPASE 29   No results for input(s): AMMONIA in the last 168 hours. Coagulation Profile: No results for input(s): INR, PROTIME in the last 168 hours. Cardiac Enzymes: No results for input(s): CKTOTAL, CKMB, CKMBINDEX, TROPONINI in the last 168 hours. BNP (last 3 results) No results for input(s): PROBNP in the last 8760 hours. HbA1C: No results for input(s): HGBA1C in the last 72 hours. CBG: No results for input(s): GLUCAP in the last 168 hours. Lipid Profile: No results for input(s): CHOL, HDL, LDLCALC, TRIG, CHOLHDL, LDLDIRECT in the last 72 hours. Thyroid Function Tests: No results for input(s):  TSH, T4TOTAL, FREET4, T3FREE, THYROIDAB in the last 72 hours. Anemia Panel: No results for input(s): VITAMINB12, FOLATE, FERRITIN, TIBC, IRON, RETICCTPCT in the last 72 hours. Sepsis Labs: No results for input(s): PROCALCITON, LATICACIDVEN in the last 168 hours.  Recent Results (from the past 240 hour(s))  Resp Panel by RT-PCR (Flu A&B, Covid) Nasopharyngeal Swab     Status: None   Collection Time: 08/24/21  2:56 AM   Specimen: Nasopharyngeal Swab; Nasopharyngeal(NP) swabs in vial transport medium  Result Value Ref Range Status   SARS Coronavirus 2 by RT PCR NEGATIVE NEGATIVE Final    Comment: (NOTE) SARS-CoV-2 target nucleic acids are NOT DETECTED.  The SARS-CoV-2 RNA is generally detectable in upper respiratory specimens during the acute phase of infection. The lowest concentration of SARS-CoV-2 viral copies this assay can detect is 138 copies/mL. A negative result does not preclude SARS-Cov-2 infection and should not be used as the sole basis for treatment or other patient management decisions. A negative result may occur with  improper specimen collection/handling, submission of specimen other than nasopharyngeal swab, presence of viral mutation(s) within the areas targeted by this assay, and inadequate number of viral copies(<138 copies/mL). A negative result must be combined with clinical observations, patient history, and epidemiological information. The expected result is Negative.  Fact Sheet for Patients:  EntrepreneurPulse.com.au  Fact Sheet for Healthcare Providers:  IncredibleEmployment.be  This test is no t yet approved or cleared by the [place] FDA and  has been authorized for detection and/or diagnosis of SARS-CoV-2 by FDA under an Emergency Use Authorization (EUA). This EUA will remain  in effect (meaning this test can be used) for the duration of the COVID-19 declaration under Section 564(b)(1) of the Act,  21 U.S.C.section 360bbb-3(b)(1), unless the authorization is terminated  or revoked sooner.       Influenza A by PCR NEGATIVE NEGATIVE Final   Influenza B by PCR NEGATIVE NEGATIVE Final    Comment: (NOTE) The Xpert Xpress SARS-CoV-2/FLU/RSV plus assay is intended as an aid in the diagnosis of influenza from Nasopharyngeal swab specimens and should not be used as a sole basis for treatment. Nasal washings and aspirates are unacceptable for Xpert Xpress SARS-CoV-2/FLU/RSV testing.  Fact Sheet for Patients: EntrepreneurPulse.com.au  Fact Sheet for Healthcare Providers: IncredibleEmployment.be  This test is not yet approved or cleared by the [place] FDA and has been authorized for detection and/or diagnosis of SARS-CoV-2 by FDA under an Emergency Use Authorization (EUA). This EUA will remain in effect (meaning this test can be used) for the duration of the COVID-19 declaration under Section 564(b)(1) of the Act, 21 U.S.C. section 360bbb-3(b)(1), unless the authorization is terminated or revoked.  Performed at [facility]  Lab, [address], [place], [place] [address]          Radiology Studies: No results found.      Scheduled Meds:  amiodarone  200 mg Oral Daily   aspirin EC  81 mg Oral Daily   enoxaparin (LOVENOX) injection  40 mg Subcutaneous Q24H   feeding supplement  1 Container Oral TID BM   levothyroxine  125 mcg Oral Q0600   magnesium oxide  400 mg Oral Daily   midodrine  10 mg Oral TID WC   multivitamin with minerals  1 tablet Oral Daily   pantoprazole  40 mg Oral Daily   rosuvastatin  20 mg Oral Daily   Continuous Infusions:  albumin human Stopped (08/26/21 1359)   lactated ringers      Assessment & Plan:   Principal Problem:   Unintentional weight loss Active Problems:   Coronary artery disease involving native coronary artery of native heart without angina pectoris   Chronic combined systolic and  diastolic congestive heart failure (HCC)   Elevated CA 19-9 level   Epigastric pain   Mixed hyperlipidemia   Hypothyroidism   Abnormal CT scan, gastrointestinal tract   Esophageal dysphagia   Failure to thrive in adult   Protein-calorie malnutrition, severe   1-Unintentional Weight loss//Epigastric pain.  -Patient presented with several month history of epigastric pain radiating to the back, early satiety, generalized weakness and unintentional weight loss. -CT revealing necrotic lymphadenopathy at the GE junction as well as near the head of the pancreas with markedly elevated CA 19-9 level -GI has been consulted plan for endoscopy after Plavix washout for 5 days 2/2 s/p EGD today , found with likely malignant gastric tumor at [place] jxn, bx. Erythmeaous mucosa in gastric body Path pending GI rec. Full liquid diet and resume plavix tomorrow. Continue ppi 2/3 path report with adenocarcinoma 2/4 plan to follow-up with Dr. Rogue Bussing as outpatient since work-up was already started. Per wife PET scan was approved to be done as outpatient    2-Chronic combined systolic and diastolic heart failure: Continue amiodarone. Holding coreg due to soft BP ECHO; EF 20-25 % 2/1 on RA . Was given lasix iv x1 yesterday.  Clinically appears more euvolemic.  2/2 does become hypotensive on standing, has orthostatisis.was reported to be asx  2/4 Euvolemic. Holding bp meds due to low bp     3-Hypotension;  Received fluids.  Recently stop spironolactone.  2/4  hold coreg.  Bp still very low on standing today, however I did check office visit with oncology his bp was low then. Appears to be running low . Since sbp 60's will give bolus 250cc today . Continue midodrine  Likely unable to tolerate his beta-blockers  Needs to wear TED hose  Discussed all of this with wife at bedside.      4.CAD, involving native arteries: Currently asymptomatic, chest pain-free 2/4 resuming plavix    5-Mixed  Hyperlipidemia:  Continue statin   6-Hypothyroidism:  Continue with Synthroid      7-Mild transaminases;  Was trending down   Estimated body mass index is 25.84 kg/m as calculated from the following:   Height as of this encounter: 5\' 7"  (1.702 m).   Weight as of this encounter: 74.8 kg.     DVT prophylaxis: Lovenox Code Status: Full Family Communication: Wife at bedside Disposition Plan: Home with home Status is: Inpatient Remains inpatient appropriate because: IV treatment.  Blood pressure very low on standing.  LOS: 5 days   Time spent: 35 minutes    Nolberto Hanlon, MD [facility] Pager [phone]  If 7PM-7AM, please contact night-coverage 08/30/2021, 10:45 AM

## 2021-08-31 DIAGNOSIS — R634 Abnormal weight loss: Secondary | ICD-10-CM | POA: Diagnosis not present

## 2021-08-31 LAB — POTASSIUM: Potassium: 4.4 mmol/L (ref 3.5–5.1)

## 2021-08-31 NOTE — Plan of Care (Signed)

## 2021-08-31 NOTE — Progress Notes (Signed)
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  Patient has been experiencing difficulty tolerating oral intake over the last several month.  Associated nausea and lack of appetite, ongoing vague abdominal and mid back discomfort.  Report ongoing unintentional 40 pound weight loss.   Patient had a CT scan 1/19 at [facility] ED which revealed multiple enlarged lymph nodes surrounding the gastroesophageal junction extending toward the pancreatic head with central decreased at admission suspicious for necrosis.  Had arrangement to follow-up outpatient with oncology whom the patient saw 1/23 at that time PET scan was recommended which has been ordered but has not yet to be completed.  Tumor marker CEA: normal, markedly elevated CEA 19-9: 765.   Patient presented to the ED for further evaluation due to worsening symptoms.   2/1 EGD planned for Thursday  2/2 s/p EGD today. Pt bp in 60's upon standing.GI recommended full liquid diet. 2/3 path returned for adenocarcinoma 2/4 still very low bp on standing.  2/5+ orthostatic and was symptomatic with standing.     Consultants:  GI  Procedures:   Antimicrobials:      Subjective: Had lightheadedness with standing. No n/v/cp or sob  Objective: Vitals:   08/30/21 2150 08/31/21 0600 08/31/21 0932 08/31/21 0937  BP: (!) 106/59 (!) 90/59 (!) 102/57 (!) 70/48  Pulse: 77 74 78 88  Resp: 17 18 18    Temp: 98.2 F (36.8 C) 98.7 F (37.1 C) 97.9 F (36.6 C)   TempSrc: Oral Oral    SpO2: 90% 94% 90% 95%  Weight:      Height:         Intake/Output Summary (Last 24 hours) at 08/31/2021 0946 Last data filed at 08/30/2021 1700 Gross per 24 hour  Intake 220 ml  Output 200 ml  Net 20 ml   Filed Weights   08/24/21 0124 08/30/21 0513  Weight: 74.8 kg 79.7 kg    Examination: Calm, NAD Cta no w/r Reg s1/s2 no gallop Soft benign +bs No edema Aaoxox3  Mood and affect appropriate in current setting     Data Reviewed: I have personally reviewed following labs and imaging studies  CBC: Recent Labs  Lab 08/25/21 0350 08/26/21 0656 08/27/21 0413  WBC 7.7 7.6 7.5  NEUTROABS 5.6  --   --   HGB 11.6* 12.0* 11.9*  HCT 34.0* 34.5* 35.6*  MCV 95.2 95.0 94.9  PLT 136* 139* 300*   Basic Metabolic Panel: Recent Labs  Lab 08/25/21 0350 08/26/21 0656 08/27/21 0413 08/30/21 0048  NA 137 139 138 138  K 3.8 3.8 3.9 5.0  CL 106 104 105 101  CO2 22 25 24 30   GLUCOSE 78 96 96 98  BUN 7* 7* 7* 7*  CREATININE 0.85 0.86 0.86 0.87  CALCIUM 8.0* 8.4* 8.3* 8.7*  MG 2.0  --   --  2.2   GFR: Estimated Creatinine Clearance: 74.2 mL/min (by C-G formula based on SCr of 0.87 mg/dL). Liver Function Tests: Recent Labs  Lab 08/25/21 0350  AST 44*  ALT 58*  ALKPHOS 44  BILITOT 0.7  PROT 4.8*  ALBUMIN 2.5*  No results for input(s): LIPASE, AMYLASE in the last 168 hours.  No results for input(s): AMMONIA in the last 168 hours. Coagulation Profile: No results for input(s): INR, PROTIME in the last 168 hours. Cardiac Enzymes: No results for input(s): CKTOTAL, CKMB, CKMBINDEX, TROPONINI in the last 168 hours. BNP (last 3 results) No results for input(s): PROBNP in the last 8760 hours. HbA1C: No results for input(s): HGBA1C in the last 72 hours. CBG: No results for input(s): GLUCAP in the last 168 hours. Lipid Profile: No results for input(s): CHOL, HDL, LDLCALC, TRIG, CHOLHDL, LDLDIRECT in the last 72 hours. Thyroid Function Tests: No results for input(s): TSH, T4TOTAL, FREET4, T3FREE, THYROIDAB in the last 72  hours. Anemia Panel: No results for input(s): VITAMINB12, FOLATE, FERRITIN, TIBC, IRON, RETICCTPCT in the last 72 hours. Sepsis Labs: No results for input(s): PROCALCITON, LATICACIDVEN in the last 168 hours.  Recent Results (from the past 240 hour(s))  Resp Panel by RT-PCR (Flu A&B, Covid) Nasopharyngeal Swab     Status: None   Collection Time: 08/24/21  2:56 AM   Specimen: Nasopharyngeal Swab; Nasopharyngeal(NP) swabs in vial transport medium  Result Value Ref Range Status   SARS Coronavirus 2 by RT PCR NEGATIVE NEGATIVE Final    Comment: (NOTE) SARS-CoV-2 target nucleic acids are NOT DETECTED.  The SARS-CoV-2 RNA is generally detectable in upper respiratory specimens during the acute phase of infection. The lowest concentration of SARS-CoV-2 viral copies this assay can detect is 138 copies/mL. A negative result does not preclude SARS-Cov-2 infection and should not be used as the sole basis for treatment or other patient management decisions. A negative result may occur with  improper specimen collection/handling, submission of specimen other than nasopharyngeal swab, presence of viral mutation(s) within the areas targeted by this assay, and inadequate number of viral copies(<138 copies/mL). A negative result must be combined with clinical observations, patient history, and epidemiological information. The expected result is Negative.  Fact Sheet for Patients:  EntrepreneurPulse.com.au  Fact Sheet for Healthcare Providers:  IncredibleEmployment.be  This test is no t yet approved or cleared by the [place] FDA and  has been authorized for detection and/or diagnosis of SARS-CoV-2 by FDA under an Emergency Use Authorization (EUA). This EUA will remain  in effect (meaning this test can be used) for the duration of the COVID-19 declaration under Section 564(b)(1) of the Act, 21 U.S.C.section 360bbb-3(b)(1), unless the authorization is  terminated  or revoked sooner.       Influenza A by PCR NEGATIVE NEGATIVE Final   Influenza B by PCR NEGATIVE NEGATIVE Final    Comment: (NOTE) The Xpert Xpress SARS-CoV-2/FLU/RSV plus assay is intended as an aid in the diagnosis of influenza from Nasopharyngeal swab specimens and should not be used as a sole basis for treatment. Nasal washings and aspirates are unacceptable for Xpert Xpress SARS-CoV-2/FLU/RSV testing.  Fact Sheet for Patients: EntrepreneurPulse.com.au  Fact Sheet for Healthcare Providers: IncredibleEmployment.be  This test is not yet approved or cleared by the [place] FDA and has been authorized for detection and/or diagnosis of SARS-CoV-2 by FDA under an Emergency Use Authorization (EUA). This EUA will remain in effect (meaning this test can be used) for the duration of the COVID-19 declaration under Section 564(b)(1) of the Act, 21 U.S.C. section 360bbb-3(b)(1), unless the authorization is terminated or revoked.  Performed at [facility], [place] [address], [place], [place] [address]          Radiology Studies: No results found.  Scheduled Meds:  amiodarone  200 mg Oral Daily   aspirin EC  81 mg Oral Daily   clopidogrel  75 mg Oral Daily   enoxaparin (LOVENOX) injection  40 mg Subcutaneous Q24H   feeding supplement  1 Container Oral TID BM   levothyroxine  125 mcg Oral Q0600   magnesium oxide  400 mg Oral Daily   midodrine  10 mg Oral TID WC   multivitamin with minerals  1 tablet Oral Daily   pantoprazole  40 mg Oral Daily   rosuvastatin  20 mg Oral Daily   Continuous Infusions:  albumin human Stopped (08/26/21 1359)    Assessment & Plan:   Principal Problem:   Unintentional weight loss Active Problems:   Coronary artery disease involving native coronary artery of native heart without angina pectoris   Chronic combined systolic and diastolic congestive heart failure (HCC)    Elevated CA 19-9 level   Epigastric pain   Mixed hyperlipidemia   Hypothyroidism   Abnormal CT scan, gastrointestinal tract   Esophageal dysphagia   Failure to thrive in adult   Protein-calorie malnutrition, severe   1-Unintentional Weight loss//Epigastric pain.  -Patient presented with several month history of epigastric pain radiating to the back, early satiety, generalized weakness and unintentional weight loss. -CT revealing necrotic lymphadenopathy at the GE junction as well as near the head of the pancreas with markedly elevated CA 19-9 level -GI has been consulted plan for endoscopy after Plavix washout for 5 days 2/2 s/p EGD today , found with likely malignant gastric tumor at [place] jxn, bx. Erythmeaous mucosa in gastric body Path pending GI rec. Full liquid diet and resume plavix tomorrow. Continue ppi 2/3 path report with adenocarcinoma 2/5 plan to follow-up with Dr. Rogue Bussing as outpatient since work-up has already been started.   PET scan as outpatient      2-Chronic combined systolic and diastolic heart failure: Continue amiodarone. Holding coreg due to soft BP ECHO; EF 20-25 % 2/1 on RA . Was given lasix iv x1 yesterday.  Clinically appears more euvolemic.  2/2 does become hypotensive on standing, has orthostatisis.was reported to be asx 2/5 euvolemic.  Holding cardiac meds due to orthostatics        3-Hypotension;  Received fluids.  Recently stop spironolactone.  2/4  hold coreg.  Bp still very low on standing today, however I did check office visit with oncology his bp was low then. Appears to be running low . Since sbp 60's will give bolus 250cc today . Continue midodrine  2/5 unable to tolerate beta-blockers due to orthostatics  Continue wearing TED hose and midodrine      4.CAD, involving native arteries: Currently asymptomatic, chest pain-free 2/5 resumed Plavix      5-Mixed Hyperlipidemia:  Continue statin   6-Hypothyroidism:  Continue with  Synthroid      7-Mild transaminases;  Was trending down   Estimated body mass index is 25.84 kg/m as calculated from the following:   Height as of this encounter: 5\' 7"  (1.702 m).   Weight as of this encounter: 74.8 kg.     DVT prophylaxis: Lovenox Code Status: Full Family Communication: Wife at bedside Disposition Plan: Home with home Status is: Inpatient Remains inpatient appropriate because: IV treatment.  Blood pressure very low on standing and pt is symptomatic                LOS: 6 days   Time spent: 35 minutes    Nolberto Hanlon, MD [facility] Pager [phone]  If 7PM-7AM, please contact night-coverage 08/31/2021, 9:46 AM

## 2021-08-31 NOTE — Progress Notes (Signed)
The patient is injury-free, afebrile, alert, and oriented X 3. Vital signs were within the baseline during this shift. Pt denies chest pain, SOB, nausea, vomiting, dizziness, signs or symptoms of bleeding or infection, or acute changes during this shift. We will continue to monitor and work toward achieving the care plan goals. ?

## 2021-09-01 ENCOUNTER — Telehealth: Payer: Self-pay

## 2021-09-01 ENCOUNTER — Encounter (HOSPITAL_COMMUNITY): Payer: Self-pay | Admitting: Internal Medicine

## 2021-09-01 DIAGNOSIS — R634 Abnormal weight loss: Secondary | ICD-10-CM | POA: Diagnosis not present

## 2021-09-01 DIAGNOSIS — R1900 Intra-abdominal and pelvic swelling, mass and lump, unspecified site: Secondary | ICD-10-CM

## 2021-09-01 DIAGNOSIS — R590 Localized enlarged lymph nodes: Secondary | ICD-10-CM

## 2021-09-01 MED ORDER — PANTOPRAZOLE SODIUM 40 MG PO TBEC: 40.0000 mg | DELAYED_RELEASE_TABLET | Freq: Every day | ORAL | 0 refills | Status: AC

## 2021-09-01 MED ORDER — ASPIRIN 81 MG PO TBEC: 81.0000 mg | DELAYED_RELEASE_TABLET | Freq: Every day | ORAL | 11 refills | Status: DC

## 2021-09-01 MED ORDER — ONDANSETRON HCL 4 MG PO TABS: 4.0000 mg | ORAL_TABLET | Freq: Every day | ORAL | 0 refills | Status: AC | PRN

## 2021-09-01 MED ORDER — MIDODRINE HCL 10 MG PO TABS: 10.0000 mg | ORAL_TABLET | Freq: Three times a day (TID) | ORAL | 0 refills | Status: AC

## 2021-09-01 NOTE — Progress Notes (Signed)
PT Cancellation Note  Patient Details Name: Jerry Farley MRN: [id number] DOB: 1946/03/05   Cancelled Treatment:    Reason Eval/Treat Not Completed: Other (comment)-Pt d/c home today pt and family want to save energy for home so pt. declines out of chair mobility. Pt. And family with no questions for PT or PT student.  Thermon Leyland, SPT    Thermon Leyland 09/01/2021, 10:03 AM

## 2021-09-01 NOTE — Telephone Encounter (Signed)
Pt schedule for PET 2/8. Pt made aware of appts

## 2021-09-01 NOTE — Telephone Encounter (Signed)
Message received from Dr. Rogue Bussing:  please schedule follow-up with me in the week of 6th- MD; labs- cbc/cmp-Thanks   Please schedule and inform patient/wife of appt details.

## 2021-09-01 NOTE — Care Management Important Message (Signed)
Important Message  Patient Details  Name: Jerry Farley MRN: [id number] Date of Birth: [birth year]   Medicare Important Message Given:  Yes  Patient left prior to IM delivery will mail the patients IM to the home address.    Eloyse Causey 09/01/2021, 1:53 PM

## 2021-09-01 NOTE — Discharge Summary (Addendum)
Jerry Farley WFU:[id number] DOB: February 16, 1946 DOA: 08/24/2021  PCP: Baxter Hire, MD  Admit date: 08/24/2021 Discharge date: 09/01/2021    Recommendations for Outpatient Follow-up:  Follow up with PCP in 1 week Please obtain BMP/CBC in one week Please follow up oncology this week Follow-up with cardiology in 1 week Follow-up with GI in 1 week  Discharge Condition:Stable CODE STATUS: Yes Diet recommendation: Heart Healthy  Brief/Interim Summary: Per HPI:76 year old past medical history significant for CAD status post PCI cath 02/13/2019, ICD placement, systolic and diastolic heart failure ejection fraction 20 to 25% echo 2/02 grade 1 diastolic dysfunction, hypothyroidism, ventricular tachycardia 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  Patient has been experiencing difficulty tolerating oral intake over the last several month.  Associated nausea and lack of appetite, ongoing vague abdominal and mid back discomfort.  Report ongoing unintentional 40 pound weight loss.   Patient had a CT scan 1/19 at [facility] ED which revealed multiple enlarged lymph nodes surrounding the gastroesophageal junction extending toward the pancreatic head with central decreased at admission suspicious for necrosis.  Had arrangement to follow-up outpatient with oncology whom the patient saw 1/23 at that time PET scan was recommended which has been ordered but has not yet to be completed.  Tumor marker CEA: normal, markedly elevated CEA 19-9: 765.   Patient presented to the ED for further evaluation due to worsening symptoms.  Patient underwent EGD.  Pathology returned for adenocarcinoma.  Was set up to follow-up with oncology.  He remained in the hospital due to symptomatic orthostatics.  Midodrine was added to his regiment and his cardiac meds except amiodarone was held.  He was given gentle hydration.  Once his orthostatics improved and was asymptomatic patient was  discharged home today..  Discussed with wife and the patient that they need to follow-up with Dr. Rogue Bussing and also his cardiologist to discuss his cardiac medications.     1-Unintentional Weight loss//Epigastric pain.  -Patient presented with several month history of epigastric pain radiating to the back, early satiety, generalized weakness and unintentional weight loss. -CT revealing necrotic lymphadenopathy at the GE junction as well as near the head of the pancreas with markedly elevated CA 19-9 level -GI has been consulted plan for endoscopy after Plavix washout for 5 days s/p EGD  found with likely malignant gastric tumor at [place] jxn, bx. Erythmeaous mucosa in gastric body  Path revealed Invasive moderate to poorly differentiated adenocarcinoma, arising in a background of focal intestinal metaplasia with high grade dysplasia  Gi recommended full liquid diet as they had a lot of food particles during EGD. Continue PPI PET scan as outpatient     2-Chronic combined systolic and diastolic heart failure: Continue amiodarone. Holding coreg due to soft BP ECHO; EF 20-25 % Clinically appears more euvolemic.  Had to hold cardiac BP meds due to symptomatic orthostatics         3-orthostatics hypotension Received fluids.  Recently stop spironolactone.  Cardiac BP meds were discontinued He was very symptomatic, was started on midodrine and given gentle hydration. Placed him compression stockings 20 to 30 mmHg Once his blood pressure was tolerable and asymptomatic patient was discharged home           4.CAD, involving native arteries: Currently asymptomatic, chest pain-free On plavix       5-Mixed Hyperlipidemia:  Continue statin   6-Hypothyroidism:  Continue with Synthroid       7-Mild transaminases;  Was trending down   Estimated body mass index is 25.84 kg/m as calculated from the following:   Height as of this encounter: 5\' 7"  (1.702 m).   Weight as of this  encounter: 74.8 kg.          Discharge Diagnoses:  Principal Problem:   Unintentional weight loss Active Problems:   Coronary artery disease involving native coronary artery of native heart without angina pectoris   Chronic combined systolic and diastolic congestive heart failure (HCC)   Elevated CA 19-9 level   Epigastric pain   Mixed hyperlipidemia   Hypothyroidism   Abnormal CT scan, gastrointestinal tract   Esophageal dysphagia   Failure to thrive in adult   Protein-calorie malnutrition, severe    Discharge Instructions  Discharge Instructions     Call MD for:  difficulty breathing, headache or visual disturbances   Complete by: As directed    Call MD for:  difficulty breathing, headache or visual disturbances   Complete by: As directed    Diet - low sodium heart healthy   Complete by: As directed    Diet - low sodium heart healthy   Complete by: As directed    Diet full liquid   Complete by: As directed    Discharge instructions   Complete by: As directed    Follow up with cardiology and oncology   Increase activity slowly   Complete by: As directed    Increase activity slowly   Complete by: As directed       Allergies as of 09/01/2021       Reactions   Contrast Media [iodinated Contrast Media] Other (See Comments)   Patient states "I don't remember exactly what happens but I know I cannot have contrast for testing."         Medication List     STOP taking these medications    aspirin 81 MG tablet Replaced by: aspirin 81 MG EC tablet   carvedilol 3.125 MG tablet Commonly known as: COREG       TAKE these medications    acetaminophen 325 MG tablet Commonly known as: TYLENOL Take 2 tablets (650 mg total) by mouth every 4 (four) hours as needed for headache or mild pain. What changed: Another medication with the same name was removed. Continue taking this medication, and follow the directions you see here.   amiodarone 200 MG  tablet Commonly known as: PACERONE Take 1 tablet (200 mg total) by mouth daily. Notes to patient: 09/02/2021   aspirin 81 MG EC tablet Take 1 tablet (81 mg total) by mouth daily. Swallow whole. Replaces: aspirin 81 MG tablet Notes to patient: 09/02/2021   clopidogrel 75 MG tablet Commonly known as: PLAVIX Take 1 tablet by mouth once daily Notes to patient: 09/02/2021   Euthyrox 125 MCG tablet Generic drug: levothyroxine TAKE 1 TABLET BY MOUTH ONCE DAILY BEFORE BREAKFAST What changed: how much to take Notes to patient: 09/02/2021   fish oil-omega-3 fatty acids 1000 MG capsule Take 1 g by mouth daily. Notes to patient: 09/01/2021   Magnesium Oxide 400 MG Caps Take 400 mg by mouth daily. Notes to patient: 09/02/2021   midodrine 10 MG tablet Commonly known as: PROAMATINE Take 1 tablet (10 mg total) by mouth 3 (three) times daily with meals. Notes to patient: 09/01/2021   multivitamin tablet Take 1 tablet by mouth daily. Notes to patient: 09/02/2021   ondansetron 4 MG tablet Commonly known as: Zofran Take 1 tablet (4 mg total) by  mouth daily as needed for up to 5 days for nausea or vomiting.   oxymetazoline 0.05 % nasal spray Commonly known as: AFRIN Place 1 spray into both nostrils 2 (two) times daily as needed for congestion.   pantoprazole 40 MG tablet Commonly known as: PROTONIX Take 1 tablet (40 mg total) by mouth daily. Notes to patient: 09/02/2021   rosuvastatin 20 MG tablet Commonly known as: CRESTOR Take 1 tablet by mouth once daily Notes to patient: 09/02/2021        Follow-up Information     [facility] Follow up.   Specialty: Home Health Services Why: Someone will call you to schedule first home visit.  Call if you you decide that you would like to have home health physical therapy Contact information: [place] [address] [phone]         Cammie Sickle, MD Follow up.   Specialties: Internal Medicine,  Oncology Why: to be seen this week. Contact information: [address] [address] [phone]         Baxter Hire, MD Follow up in 1 week(s).   Specialty: Internal Medicine Contact information: [place] [address] [phone]         Deboraha Sprang, MD Follow up in 1 week(s).   Specialty: Cardiology Contact information: [place] [address] [phone]         Sharyn Creamer, MD Follow up in 1 week(s).   Specialty: Gastroenterology Contact information: [address] [place] [address] [phone]                Allergies  Allergen Reactions   Contrast Media [Iodinated Contrast Media] Other (See Comments)    Patient states "I don't remember exactly what happens but I know I cannot have contrast for testing."     Consultations: GI, oncology   Procedures/Studies: CT CHEST ABDOMEN PELVIS W CONTRAST  Result Date: 08/15/2021 CLINICAL DATA:  Unintended weight loss with nausea, initial encounter EXAM: CT CHEST, ABDOMEN, AND PELVIS WITH CONTRAST TECHNIQUE: Multidetector CT imaging of the chest, abdomen and pelvis was performed following the standard protocol during bolus administration of intravenous contrast. RADIATION DOSE REDUCTION: This exam was performed according to the departmental dose-optimization program which includes automated exposure control, adjustment of the mA and/or kV according to patient size and/or use of iterative reconstruction technique. CONTRAST:  172mL OMNIPAQUE IOHEXOL 300 MG/ML SOLN. Patient was pre-medicated with 4 hour emergency premedication COMPARISON:  None. FINDINGS: CT CHEST FINDINGS Cardiovascular: Thoracic aorta demonstrates atherosclerotic calcifications without aneurysmal dilatation. Coronary calcifications are seen. No cardiac enlargement is noted. Pulmonary artery as visualized is within normal limits. Defibrillator device is  noted. Mediastinum/Nodes: Thoracic inlet is within normal limits. There is a 12 mm short axis lymph node identified in the right paratracheal region. No other sizable hilar or mediastinal adenopathy is noted. The esophagus appears within normal limits. Lungs/Pleura: Lungs are well aerated bilaterally and demonstrate emphysematous change. No focal confluent infiltrate or sizable effusion is seen. Mild tree-in-bud changes are noted throughout both lungs likely related to atypical pneumonia. Musculoskeletal: Degenerative changes of the thoracic spine are noted. No acute rib abnormality is seen. CT ABDOMEN PELVIS FINDINGS Hepatobiliary: Liver demonstrates some scattered calcifications likely related to granulomas. Mild periportal edema is noted which may be related volume overload or hepatic inflammatory change. Gallbladder is well distended with dependent gallstones. No wall thickening or  pericholecystic fluid is noted. Pancreas: Unremarkable. No pancreatic ductal dilatation or surrounding inflammatory changes. Spleen: Normal in size without focal abnormality. Adrenals/Urinary Tract: Adrenal glands are within normal limits. Kidneys demonstrate a normal enhancement pattern bilaterally. Small cyst is noted in the lower pole of right kidney. No calculi or obstructive changes are seen. The bladder is within normal limits. Stomach/Bowel: Diverticular change of the colon is noted. No evidence of diverticulitis is seen. No obstructive or inflammatory changes are noted. The appendix is not well visualized. No inflammatory changes to suggest appendicitis are noted. The stomach small bowel appear within normal limits. Vascular/Lymphatic: Atherosclerotic calcifications are identified. Scattered Peri aortic lymph nodes are noted with central decreased attenuation consistent with necrosis. The largest of these measures 10 mm in short axis. Adjacent to the pancreatic head and extending superiorly towards the gastroesophageal  junction there are multiple areas of fluid attenuation with peripheral enhancement likely related to centrally necrotic lymph nodes. Adjacent to the pancreatic head and portacaval space these measure 16 mm in short axis. The total size of the area surrounding the gastroesophageal junction measures 4.8 x 2.5 cm. Additionally some right iliac chain nodes are noted measuring up to 9 mm. Reproductive: Prostate is unremarkable. Other: No abdominal wall hernia or abnormality. No abdominopelvic ascites. Musculoskeletal: No acute or significant osseous findings. IMPRESSION: Multiple lymph nodes are noted surrounding the gastroesophageal junction, extending towards the pancreatic head, involving the portacaval space and pre portal region as well as the intra-aortocaval and periaortic and right iliac chain. These demonstrate central decreased attenuation suspicious for necrosis. A definitive primary neoplasm is not identified on this exam. PET-CT and tissue sampling would be helpful. Diverticulosis without diverticulitis. Cholelithiasis. Tree-in-bud changes within the lungs bilaterally likely related to atypical pneumonia. Electronically Signed   By: Inez Catalina M.D.   On: 08/15/2021 03:01   DG CHEST PORT 1 VIEW  Result Date: 08/26/2021 CLINICAL DATA:  Dyspnea, nausea EXAM: PORTABLE CHEST 1 VIEW COMPARISON:  CT chest dated August 15, 2021 FINDINGS: The heart size and mediastinal contours are within normal limits. Atherosclerotic calcification of aortic arch. Pacemaker leads terminating in the right atrium, right ventricle and coronary sinus. Low lung volumes. Bilateral hazy lung opacities, which may be secondary to prominence of the vessels due to low lung volumes are infectious/inflammatory process. No focal consolidation or pleural effusion. The visualized skeletal structures are unremarkable. IMPRESSION: Low lung volumes with hazy lung opacities bilaterally, which may represent prominent vessels due to low lung  volumes. Differential includes infectious/inflammatory process. No focal consolidation or large pleural effusion. Electronically Signed   By: Keane Police D.O.   On: 08/26/2021 08:08   ECHOCARDIOGRAM COMPLETE  Result Date: 08/11/2021    ECHOCARDIOGRAM REPORT   Patient Name:   TROI FLORENDO Date of Exam: 08/11/2021 Medical Rec #:  [id number]       Height:       67.0 in Accession #:    [id number]      Weight:       176.0 lb Date of Birth:  [birth year]       BSA:          1.915 m Patient Age:    54 years        BP:           101/61 mmHg Patient Gender: M               HR:           75 bpm. Exam Location:  [address] Procedure: 2D Echo, 3D Echo, Cardiac Doppler and Color Doppler Indications:    I47.20 Ventricular tachycardia  History:        Patient has prior history of Echocardiogram examinations, most                 recent 06/27/2015. NICM and CHF, CAD, Defibrillator; Risk                 Factors:HLD.  Sonographer:    Marygrace Drought RCS Referring Phys: [id number] [place]  1. Left ventricular ejection fraction, by estimation, is 20 to 25%. The left ventricle has severely decreased function. The left ventricle demonstrates global hypokinesis. Left ventricular diastolic parameters are consistent with Grade I diastolic dysfunction (impaired relaxation).  2. Right ventricular systolic function is mildly reduced. The right ventricular size is normal. There is normal pulmonary artery systolic pressure.  3. The mitral valve is normal in structure. Mild mitral valve regurgitation. No evidence of mitral stenosis.  4. The aortic valve is tricuspid. Aortic valve regurgitation is mild. Aortic valve sclerosis/calcification is present, without any evidence of aortic stenosis.  5. The inferior vena cava is normal in size with greater than 50% respiratory variability, suggesting right atrial pressure of 3 mmHg. FINDINGS  Left Ventricle: Left ventricular ejection fraction, by estimation, is 20 to 25%. The left  ventricle has severely decreased function. The left ventricle demonstrates global hypokinesis. The left ventricular internal cavity size was normal in size. There is no left ventricular hypertrophy. Left ventricular diastolic parameters are consistent with Grade I diastolic dysfunction (impaired relaxation). Right Ventricle: The right ventricular size is normal. Right vetricular wall thickness was not well visualized. Right ventricular systolic function is mildly reduced. There is normal pulmonary artery systolic pressure. The tricuspid regurgitant velocity is 1.94 m/s, and with an assumed right atrial pressure of 3 mmHg, the estimated right ventricular systolic pressure is 93.2 mmHg. Left Atrium: Left atrial size was normal in size. Right Atrium: Right atrial size was normal in size. Pericardium: There is no evidence of pericardial effusion. Mitral Valve: The mitral valve is normal in structure. Mild mitral valve regurgitation. No evidence of mitral valve stenosis. Tricuspid Valve: The tricuspid valve is normal in structure. Tricuspid valve regurgitation is trivial. Aortic Valve: The aortic valve is tricuspid. Aortic valve regurgitation is mild. Aortic valve sclerosis/calcification is present, without any evidence of aortic stenosis. Pulmonic Valve: The pulmonic valve was not well visualized. Pulmonic valve regurgitation is trivial. Aorta: The aortic root and ascending aorta are structurally normal, with no evidence of dilitation. Venous: The inferior vena cava is normal in size with greater than 50% respiratory variability, suggesting right atrial pressure of 3 mmHg. IAS/Shunts: The interatrial septum was not well visualized.  LEFT VENTRICLE PLAX 2D LVIDd:         5.00 cm LVIDs:         4.50 cm LV PW:         0.80 cm LV IVS:        0.70 cm LVOT diam:     1.90 cm LV SV:         195 LV SV Index:   102 LVOT Area:     2.84 cm  RIGHT VENTRICLE RV Basal diam:  2.50 cm LEFT ATRIUM         Index LA diam:    4.30 cm 2.25  cm/m  AORTIC VALVE LVOT VTI:    0.688 m MV E velocity: 30.90 cm/s  TRICUSPID VALVE MV A velocity: 7280.00 cm/s  TR Peak grad:   15.1 mmHg MV E/A ratio:  0.00          TR Vmax:        194.00 cm/s                               SHUNTS                              Systemic VTI:  0.69 m                              Systemic Diam: 1.90 cm Oswaldo Milian MD Electronically signed by Oswaldo Milian MD Signature Date/Time: 08/11/2021/4:32:35 PM    Final    CUP PACEART INCLINIC DEVICE CHECK  Result Date: 08/07/2021 CRT-D device check in office. Thresholds and sensing consistent with previous device measurements. Lead impedance trends stable over time. No mode switch episodes recorded. No ventricular arrhythmia episodes recorded. Patient bi-ventricularly pacing 89% of the time. Device programmed with appropriate safety margins. Heart failure diagnostics reviewed and trends are stable for patient. No changes made this session. Estimated longevity _2.6 years__.  Patient enrolled in remote follow up. Plan to check device remotely in 3 months se note for full discussion suspect the NSVT events are what is reducing his BP%     Subjective: No dizziness or lightheadedness, no shortness of breath or chest pain  Discharge Exam: Vitals:   09/01/21 0608 09/01/21 0933  BP: (!) 102/54 (!) 95/54  Pulse: 64 74  Resp: 18 19  Temp: 97.7 F (36.5 C) 98.3 F (36.8 C)  SpO2: 94% 95%   Vitals:   08/31/21 1607 08/31/21 2218 09/01/21 0608 09/01/21 0933  BP: 99/62 (!) 110/52 (!) 102/54 (!) 95/54  Pulse: 78 74 64 74  Resp: 18 18 18 19   Temp: 97.9 F (36.6 C) 98.1 F (36.7 C) 97.7 F (36.5 C) 98.3 F (36.8 C)  TempSrc: Oral Oral Oral Oral  SpO2: 96% 93% 94% 95%  Weight:      Height:        General: Pt is alert, awake, not in acute distress Cardiovascular: RRR, S1/S2 +, no rubs, no gallops Respiratory: CTA bilaterally, no wheezing, no rhonchi Abdominal: Soft, NT, ND, bowel sounds + Extremities: no  edema, no cyanosis    The results of significant diagnostics from this hospitalization (including imaging, microbiology, ancillary and laboratory) are listed below for reference.     Microbiology: Recent Results (from the past 240 hour(s))  Resp Panel by RT-PCR (Flu A&B, Covid) Nasopharyngeal Swab     Status: None   Collection Time: 08/24/21  2:56 AM   Specimen: Nasopharyngeal Swab; Nasopharyngeal(NP) swabs in vial transport medium  Result Value Ref Range Status   SARS Coronavirus 2 by RT PCR NEGATIVE NEGATIVE Final    Comment: (NOTE) SARS-CoV-2 target nucleic acids are NOT DETECTED.  The SARS-CoV-2 RNA is generally detectable in upper respiratory specimens during the acute phase of infection. The lowest concentration of SARS-CoV-2 viral copies this assay can detect is 138 copies/mL. A negative result does not preclude SARS-Cov-2 infection and should not be used as the sole basis for treatment or other patient management decisions. A negative result may occur with  improper specimen collection/handling, submission of specimen other than nasopharyngeal swab, presence of  viral mutation(s) within the areas targeted by this assay, and inadequate number of viral copies(<138 copies/mL). A negative result must be combined with clinical observations, patient history, and epidemiological information. The expected result is Negative.  Fact Sheet for Patients:  EntrepreneurPulse.com.au  Fact Sheet for Healthcare Providers:  IncredibleEmployment.be  This test is no t yet approved or cleared by the [place] FDA and  has been authorized for detection and/or diagnosis of SARS-CoV-2 by FDA under an Emergency Use Authorization (EUA). This EUA will remain  in effect (meaning this test can be used) for the duration of the COVID-19 declaration under Section 564(b)(1) of the Act, 21 U.S.C.section 360bbb-3(b)(1), unless the authorization is terminated  or  revoked sooner.       Influenza A by PCR NEGATIVE NEGATIVE Final   Influenza B by PCR NEGATIVE NEGATIVE Final    Comment: (NOTE) The Xpert Xpress SARS-CoV-2/FLU/RSV plus assay is intended as an aid in the diagnosis of influenza from Nasopharyngeal swab specimens and should not be used as a sole basis for treatment. Nasal washings and aspirates are unacceptable for Xpert Xpress SARS-CoV-2/FLU/RSV testing.  Fact Sheet for Patients: EntrepreneurPulse.com.au  Fact Sheet for Healthcare Providers: IncredibleEmployment.be  This test is not yet approved or cleared by the [place] FDA and has been authorized for detection and/or diagnosis of SARS-CoV-2 by FDA under an Emergency Use Authorization (EUA). This EUA will remain in effect (meaning this test can be used) for the duration of the COVID-19 declaration under Section 564(b)(1) of the Act, 21 U.S.C. section 360bbb-3(b)(1), unless the authorization is terminated or revoked.  Performed at [facility], [place] [address] [place], [place] [address]      Labs: BNP (last 3 results) Recent Labs    08/26/21 0656  BNP 676.1*   Basic Metabolic Panel: Recent Labs  Lab 08/27/21 0413 08/30/21 0048 08/31/21 1022 09/02/21 1254  NA 138 138  --  136  K 3.9 5.0 4.4 4.5  CL 105 101  --  97*  CO2 24 30  --  31  GLUCOSE 96 98  --  123*  BUN 7* 7*  --  9  CREATININE 0.86 0.87  --  0.71  CALCIUM 8.3* 8.7*  --  8.9  MG  --  2.2  --   --    Liver Function Tests: Recent Labs  Lab 09/02/21 1254  AST 63*  ALT 72*  ALKPHOS 83  BILITOT 0.6  PROT 6.1*  ALBUMIN 3.2*   No results for input(s): LIPASE, AMYLASE in the last 168 hours. No results for input(s): AMMONIA in the last 168 hours. CBC: Recent Labs  Lab 08/27/21 0413 09/02/21 1254  WBC 7.5 7.5  NEUTROABS  --  5.7  HGB 11.9* 13.0  HCT 35.6* 39.2  MCV 94.9 95.6  PLT 138* 200   Cardiac Enzymes: No results for input(s): CKTOTAL,  CKMB, CKMBINDEX, TROPONINI in the last 168 hours. BNP: Invalid input(s): POCBNP CBG: No results for input(s): GLUCAP in the last 168 hours. D-Dimer No results for input(s): DDIMER in the last 72 hours. Hgb A1c No results for input(s): HGBA1C in the last 72 hours. Lipid Profile No results for input(s): CHOL, HDL, LDLCALC, TRIG, CHOLHDL, LDLDIRECT in the last 72 hours. Thyroid function studies No results for input(s): TSH, T4TOTAL, T3FREE, THYROIDAB in the last 72 hours.  Invalid input(s): FREET3 Anemia work up No results for input(s): VITAMINB12, FOLATE, FERRITIN, TIBC, IRON, RETICCTPCT in the last 72 hours. Urinalysis  Component Value Date/Time   COLORURINE YELLOW 08/24/2021 0529   APPEARANCEUR CLEAR 08/24/2021 0529   LABSPEC 1.014 08/24/2021 0529   PHURINE 6.0 08/24/2021 0529   GLUCOSEU NEGATIVE 08/24/2021 0529   HGBUR NEGATIVE 08/24/2021 0529   BILIRUBINUR NEGATIVE 08/24/2021 0529   KETONESUR 5 (A) 08/24/2021 0529   PROTEINUR NEGATIVE 08/24/2021 0529   NITRITE NEGATIVE 08/24/2021 0529   LEUKOCYTESUR NEGATIVE 08/24/2021 0529   Sepsis Labs Invalid input(s): PROCALCITONIN,  WBC,  LACTICIDVEN Microbiology Recent Results (from the past 240 hour(s))  Resp Panel by RT-PCR (Flu A&B, Covid) Nasopharyngeal Swab     Status: None   Collection Time: 08/24/21  2:56 AM   Specimen: Nasopharyngeal Swab; Nasopharyngeal(NP) swabs in vial transport medium  Result Value Ref Range Status   SARS Coronavirus 2 by RT PCR NEGATIVE NEGATIVE Final    Comment: (NOTE) SARS-CoV-2 target nucleic acids are NOT DETECTED.  The SARS-CoV-2 RNA is generally detectable in upper respiratory specimens during the acute phase of infection. The lowest concentration of SARS-CoV-2 viral copies this assay can detect is 138 copies/mL. A negative result does not preclude SARS-Cov-2 infection and should not be used as the sole basis for treatment or other patient management decisions. A negative result may occur  with  improper specimen collection/handling, submission of specimen other than nasopharyngeal swab, presence of viral mutation(s) within the areas targeted by this assay, and inadequate number of viral copies(<138 copies/mL). A negative result must be combined with clinical observations, patient history, and epidemiological information. The expected result is Negative.  Fact Sheet for Patients:  EntrepreneurPulse.com.au  Fact Sheet for Healthcare Providers:  IncredibleEmployment.be  This test is no t yet approved or cleared by the [place] FDA and  has been authorized for detection and/or diagnosis of SARS-CoV-2 by FDA under an Emergency Use Authorization (EUA). This EUA will remain  in effect (meaning this test can be used) for the duration of the COVID-19 declaration under Section 564(b)(1) of the Act, 21 U.S.C.section 360bbb-3(b)(1), unless the authorization is terminated  or revoked sooner.       Influenza A by PCR NEGATIVE NEGATIVE Final   Influenza B by PCR NEGATIVE NEGATIVE Final    Comment: (NOTE) The Xpert Xpress SARS-CoV-2/FLU/RSV plus assay is intended as an aid in the diagnosis of influenza from Nasopharyngeal swab specimens and should not be used as a sole basis for treatment. Nasal washings and aspirates are unacceptable for Xpert Xpress SARS-CoV-2/FLU/RSV testing.  Fact Sheet for Patients: EntrepreneurPulse.com.au  Fact Sheet for Healthcare Providers: IncredibleEmployment.be  This test is not yet approved or cleared by the [place] FDA and has been authorized for detection and/or diagnosis of SARS-CoV-2 by FDA under an Emergency Use Authorization (EUA). This EUA will remain in effect (meaning this test can be used) for the duration of the COVID-19 declaration under Section 564(b)(1) of the Act, 21 U.S.C. section 360bbb-3(b)(1), unless the authorization is terminated  or revoked.  Performed at [facility], [place] [address] [place],  [address]      Time coordinating discharge: Over 30 minutes  SIGNED:   Nolberto Hanlon, MD  [facility] 09/02/2021, 5:32 PM Pager   If 7PM-7AM, please contact night-coverage [email or web address] Password [email or web address]

## 2021-09-01 NOTE — Telephone Encounter (Signed)
Dr. B would like to go ahead and proceed with getting PET scheduled as well.

## 2021-09-01 NOTE — Progress Notes (Signed)
Occupational Therapy Treatment Patient Details Name: Jerry Farley MRN: [id number] DOB: 22-Sep-1945 Today's Date: 09/01/2021   History of present illness 76 year old male who presents to [facility] emergency department with inability to tolerate oral intake, and unintentional weight loss;  with past medical history of coronary artery disease (PCI to 1st OM, last cath 01/2019), ICD placement (originally medtronic, St. Jude replacement 70/1779), systolic and diastolic congestive heart failure (Echo 07/2021 EF 20-25% with G1DD), hyperlipidemia , hypothyroidism and ventricular tachycardia status post RFCA 01/2021   OT comments  Session focused on addressing dynamic standing balance with ability to reach ADL items outside of BOS with pt able to complete without LOB or safety concerns. Pt requires assist for compression stocking mgmt before OOB activities, but wife reports comfortable assisting with this task at home. Pt's wife also reports assisting pt to bathroom this AM without difficulty and both report comfortable monitoring BP at home. DC recs remain appropriate.  BP supine: 103/61 (73) BP sitting: 97/55 (68) BP after standing activities: 101/54 (69)   Recommendations for follow up therapy are one component of a multi-disciplinary discharge planning process, led by the attending physician.  Recommendations may be updated based on patient status, additional functional criteria and insurance authorization.    Follow Up Recommendations  No OT follow up    Assistance Recommended at Discharge Set up Supervision/Assistance  Patient can return home with the following  A little help with bathing/dressing/bathroom;Assistance with cooking/housework;Assist for transportation   Equipment Recommendations  None recommended by OT    Recommendations for Other Services      Precautions / Restrictions Precautions Precautions: Fall Precaution Comments: watch BPs and symptoms; compression stockings  OOB Restrictions Weight Bearing Restrictions: No       Mobility Bed Mobility Overal bed mobility: Modified Independent                  Transfers Overall transfer level: Modified independent Equipment used: Rolling walker (2 wheels) Transfers: Sit to/from Stand Sit to Stand: Modified independent (Device/Increase time)                 Balance Overall balance assessment: Needs assistance Sitting-balance support: No upper extremity supported, Feet supported Sitting balance-Leahy Scale: Good     Standing balance support: No upper extremity supported, Bilateral upper extremity supported Standing balance-Leahy Scale: Fair                             ADL either performed or assessed with clinical judgement   ADL Overall ADL's : Needs assistance/impaired                     Lower Body Dressing: Minimal assistance;Sit to/from stand;Sitting/lateral leans Lower Body Dressing Details (indicate cue type and reason): assist for compression stocking mgmt   Toilet Transfer Details (indicate cue type and reason): per wife, she assisted pt using RW to moblize to/from bathroom this AM without issues         Functional mobility during ADLs: Rolling walker (2 wheels);Set up General ADL Comments: Pt with improving BP (medication changes and wearing compression stockings OOB). Focus on accessing ADL items reaching above head without LOB or safety concerns; translated to accessing items from fridge, etc. Pt/wife report monitoring BP at home    Extremity/Trunk Assessment Upper Extremity Assessment Upper Extremity Assessment: Overall WFL for tasks assessed   Lower Extremity Assessment Lower Extremity Assessment: Defer to PT evaluation  Vision   Vision Assessment?: No apparent visual deficits   Perception     Praxis      Cognition Arousal/Alertness: Awake/alert Behavior During Therapy: WFL for tasks assessed/performed Overall Cognitive  Status: Within Functional Limits for tasks assessed                                          Exercises      Shoulder Instructions       General Comments      Pertinent Vitals/ Pain       Pain Assessment Pain Assessment: Faces Faces Pain Scale: Hurts a little bit Pain Location: neck Pain Descriptors / Indicators: Sore Pain Intervention(s): Monitored during session, Patient requesting pain meds-RN notified  Home Living                                          Prior Functioning/Environment              Frequency  Min 2X/week        Progress Toward Goals  OT Goals(current goals can now be found in the care plan section)  Progress towards OT goals: Progressing toward goals  Acute Rehab OT Goals Patient Stated Goal: go home today OT Goal Formulation: With patient Time For Goal Achievement: 09/09/21 Potential to Achieve Goals: Good ADL Goals Pt Will Perform Grooming: with modified independence;standing Pt Will Perform Upper Body Bathing: with modified independence;sitting Pt Will Perform Lower Body Bathing: with modified independence;sit to/from stand Pt Will Perform Tub/Shower Transfer: with modified independence;ambulating  Plan Discharge plan remains appropriate    Co-evaluation                 AM-PAC OT "6 Clicks" Daily Activity     Outcome Measure   Help from another person eating meals?: None Help from another person taking care of personal grooming?: None Help from another person toileting, which includes using toliet, bedpan, or urinal?: A Little Help from another person bathing (including washing, rinsing, drying)?: A Little Help from another person to put on and taking off regular upper body clothing?: None Help from another person to put on and taking off regular lower body clothing?: A Little 6 Click Score: 21    End of Session Equipment Utilized During Treatment: Gait belt;Rolling walker (2  wheels)  OT Visit Diagnosis: Unsteadiness on feet (R26.81);Other abnormalities of gait and mobility (R26.89);Muscle weakness (generalized) (M62.81)   Activity Tolerance Patient tolerated treatment well   Patient Left in chair;with call bell/phone within reach;with nursing/sitter in room;with family/visitor present   Nurse Communication Mobility status        Time: 2992-4268 OT Time Calculation (min): 20 min  Charges: OT General Charges $OT Visit: 1 Visit OT Treatments $Therapeutic Activity: 8-22 mins  Malachy Chamber, OTR/L Acute Rehab Services Office: [phone]   Layla Maw 09/01/2021, 7:44 AM

## 2021-09-02 ENCOUNTER — Inpatient Hospital Stay: Payer: Medicare HMO | Attending: Internal Medicine

## 2021-09-02 ENCOUNTER — Other Ambulatory Visit: Payer: Self-pay

## 2021-09-02 ENCOUNTER — Inpatient Hospital Stay: Payer: Medicare HMO | Admitting: Internal Medicine

## 2021-09-02 ENCOUNTER — Encounter: Payer: Self-pay | Admitting: Internal Medicine

## 2021-09-02 ENCOUNTER — Telehealth: Payer: Self-pay

## 2021-09-02 DIAGNOSIS — Z79899 Other long term (current) drug therapy: Secondary | ICD-10-CM | POA: Diagnosis not present

## 2021-09-02 DIAGNOSIS — I428 Other cardiomyopathies: Secondary | ICD-10-CM | POA: Diagnosis not present

## 2021-09-02 DIAGNOSIS — C160 Malignant neoplasm of cardia: Secondary | ICD-10-CM | POA: Insufficient documentation

## 2021-09-02 DIAGNOSIS — R590 Localized enlarged lymph nodes: Secondary | ICD-10-CM

## 2021-09-02 DIAGNOSIS — I9589 Other hypotension: Secondary | ICD-10-CM | POA: Diagnosis not present

## 2021-09-02 DIAGNOSIS — Z87891 Personal history of nicotine dependence: Secondary | ICD-10-CM | POA: Insufficient documentation

## 2021-09-02 DIAGNOSIS — R1900 Intra-abdominal and pelvic swelling, mass and lump, unspecified site: Secondary | ICD-10-CM

## 2021-09-02 DIAGNOSIS — I110 Hypertensive heart disease with heart failure: Secondary | ICD-10-CM | POA: Diagnosis not present

## 2021-09-02 DIAGNOSIS — E039 Hypothyroidism, unspecified: Secondary | ICD-10-CM | POA: Diagnosis not present

## 2021-09-02 LAB — CBC WITH DIFFERENTIAL/PLATELET
Abs Immature Granulocytes: 0.02 10*3/uL (ref 0.00–0.07)
Basophils Absolute: 0 10*3/uL (ref 0.0–0.1)
Basophils Relative: 0 %
Eosinophils Absolute: 0.2 10*3/uL (ref 0.0–0.5)
Eosinophils Relative: 3 %
HCT: 39.2 % (ref 39.0–52.0)
Hemoglobin: 13 g/dL (ref 13.0–17.0)
Immature Granulocytes: 0 %
Lymphocytes Relative: 13 %
Lymphs Abs: 1 10*3/uL (ref 0.7–4.0)
MCH: 31.7 pg (ref 26.0–34.0)
MCHC: 33.2 g/dL (ref 30.0–36.0)
MCV: 95.6 fL (ref 80.0–100.0)
Monocytes Absolute: 0.7 10*3/uL (ref 0.1–1.0)
Monocytes Relative: 9 %
Neutro Abs: 5.7 10*3/uL (ref 1.7–7.7)
Neutrophils Relative %: 75 %
Platelets: 200 10*3/uL (ref 150–400)
RBC: 4.1 MIL/uL — ABNORMAL LOW (ref 4.22–5.81)
RDW: 13.9 % (ref 11.5–15.5)
WBC: 7.5 10*3/uL (ref 4.0–10.5)
nRBC: 0 % (ref 0.0–0.2)

## 2021-09-02 LAB — COMPREHENSIVE METABOLIC PANEL
ALT: 72 U/L — ABNORMAL HIGH (ref 0–44)
AST: 63 U/L — ABNORMAL HIGH (ref 15–41)
Albumin: 3.2 g/dL — ABNORMAL LOW (ref 3.5–5.0)
Alkaline Phosphatase: 83 U/L (ref 38–126)
Anion gap: 8 (ref 5–15)
BUN: 9 mg/dL (ref 8–23)
CO2: 31 mmol/L (ref 22–32)
Calcium: 8.9 mg/dL (ref 8.9–10.3)
Chloride: 97 mmol/L — ABNORMAL LOW (ref 98–111)
Creatinine, Ser: 0.71 mg/dL (ref 0.61–1.24)
GFR, Estimated: >60 mL/min (ref >60)
Glucose, Bld: 123 mg/dL — ABNORMAL HIGH (ref 70–99)
Potassium: 4.5 mmol/L (ref 3.5–5.1)
Sodium: 136 mmol/L (ref 135–145)
Total Bilirubin: 0.6 mg/dL (ref 0.3–1.2)
Total Protein: 6.1 g/dL — ABNORMAL LOW (ref 6.5–8.1)

## 2021-09-02 NOTE — Research (Addendum)
Trial: MTG-015 [id number]- Translational Medicine: Discovery and Evaluation of Biomarkers / Pharmacogenomics for the Diagnosis and Personlaized Mangement of Patients:   Patient Jerry Farley was identified by [name], RN as a potential candidate for the above listed study.  This Clinical Research Nurse met with [name], [id number], on 09/02/21 in a manner and location that ensures patient privacy to discuss participation in the above listed research study.  Patient is Accompanied by spouse .  A copy of the informed consent document and separate HIPAA Authorization was provided to the patient.  Patient reads, speaks, and understands [place].    Patient was provided with the business card of this Nurse and encouraged to contact the research team with any questions.  Patient was provided the option of taking informed consent documents home to review and was encouraged to review at their convenience with their support network, including other care providers. Patient is comfortable with making a decision regarding study participation today.  As outlined in the informed consent form, this Nurse and [name] discussed the purpose of the research study, the investigational nature of the study, study procedures and requirements for study participation, potential risks and benefits of study participation, as well as alternatives to participation. This study is not blinded. The patient understands participation is voluntary and they may withdraw from study participation at any time.  This study does not involve randomization.  This study does not involve an investigational drug or device. This study does not involve a placebo. Patient understands enrollment is pending full eligibility review.   Confidentiality and how the patient's information will be used as part of study participation were discussed.  Patient was informed there is reimbursement provided for their time and effort spent on  trial participation, the patient will receive a Visa card with 50$ after his blood is taken for the study.  The patient is encouraged to discuss research study participation with their insurance provider to determine what costs they may incur as part of study participation, including research related injury.    All questions were answered to patient's satisfaction.  The informed consent and separate HIPAA Authorization was reviewed page by page.  The patient's mental and emotional status is appropriate to provide informed consent, and the patient verbalizes an understanding of study participation.  Patient has agreed to participate in the above listed research study and has voluntarily signed the informed consent version dated 06/16/2021 and separate HIPAA Authorization, version 5  on 09/02/21 at 145PM.  The patient was provided with a copy of the signed informed consent form and separate HIPAA Authorization for their reference.  No study specific procedures were obtained prior to the signing of the informed consent document.  Approximately 30 minutes were spent with the patient reviewing the informed consent documents.  After obtaining informed consent patient, voluntarily signed the optional Release of Information form for use throughout trial participation. Patient and wife understand the labs will be obtained at his next office visit and lab appointment. Research nurse will verify patients eligibility and obtain a second verification prior to registration to the protocol.  [name], RN 09/02/21 1:49 PM

## 2021-09-02 NOTE — Assessment & Plan Note (Addendum)
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  Await PET scan for further staging as planned tomorrow 2/08.  #I reviewed the pathology; the likely clinical stage as stage IV esophagus cancer.  Discussed unfortunately-patient's disease is most likely incurable; and treatments are palliative.   #I had a long discussion the patient wife regarding the multiple treatment options including-chemoradiation on a palliative basis vs-chemotherapy alone again on a palliative basis.  Given patient's extensive abdominal adenopathy-/stage IV-patient would not be candidate for any curative resection especially context of his significant cardiac comorbidities/poor performance status etc.  #I had a long discussion again-my significant concerns for potential side effects of either chemotherapy or radiation given his poor performance status/chronic hypotension blood pressures 20F systolic/and significant cardiomyopathy.  Discussed in general the life expectancy of metastatic esophagus cancer is approximately 12 months with treatments.  #I also discussed regarding hospice [however patient's wife had a "bad" experience with hospice/being her mother's care for lung cancer.].  Discussed regarding palliative care evaluation/recommend evaluation with Josh at next visit.  #Nonischemic cardiomyopathy /status post defibrillator/ablation.  Hypotension: 00F to 12R systolic/diastolic 97J to 88T [chronic]-discussed with hospitalist service/Dr. Raynelle Bring with discontinuation of aspirin/Coreg.  Continue with Plavix/amiodarone.   # DISPOSITION: # follow up on 2/17; MD; labs- cbc/cmp # follow up H+Jsoh on 2/17 re: pallaitive care-dr.B  # I reviewed the blood work- with the patient in detail; also reviewed the imaging independently [as summarized above]; and  with the patient in detail.   # 40 minutes face-to-face with the patient discussing the above plan of care; more than 50% of time spent on prognosis/ natural history; counseling and coordination.   Cc; Dr.Johnston/Dr.Kline

## 2021-09-02 NOTE — Progress Notes (Signed)
Pt's wife states she was unsure on what meds to give him today so she gave him everything that was on the list she had. She would like some clarification on which meds to give and stop.

## 2021-09-02 NOTE — Telephone Encounter (Signed)
Per Dr. Kaylyn Layer inform patient's wife-that after discussion with the hospital Dr./And Dr. Silas Flood aspirin; stop Coreg.  Continue rest of the medications as recommended/discussed at the visit. Thank you!  Pt's wife was notified of these changes. She verbalize understanding.  She will be faxing forms for FMLA for her job. Ok to fax back. Would like copy for self, pt has appt 09/12/2021, can p/u then.

## 2021-09-02 NOTE — Progress Notes (Signed)
[place] NOTE  Patient Care Team: Baxter Hire, MD as PCP - General (Internal Medicine)  CHIEF COMPLAINTS/PURPOSE OF CONSULTATION: Gastroesophageal cancer     Oncology History Overview Note    FEB 2023- [place] [in pt EGD]- GE Junction mass-large fungating circumferential; nonbleeding- . GE JUNCTION MASS, BIOPSY:  - Invasive moderate to poorly differentiated adenocarcinoma, arising in  a background of focal intestinal metaplasia with high grade dysplasia   # JAN 2023 ER - [BACK PAIN- ]Abdominal Lymphadenopathy --gastro esophagus/pancreas/portocaval space/intra-aortocaval and periaortic and right iliac chain-suspicious for necrosis based on imaging.  Highly concerning for malignancy.    # Nonischemic cardiomyopathy /status post defibrillator/-polymorphic ventricular tachycardia ablation-amiodarone [Dr.Klein]; CHF/ Chronic hypotension: 06T to 01S systolic/diastolic 01U to 93A [chronic]-NO  diuretics.  Aspirin plus Plavix.   Hypothyroidism- on synthroid    Gastroesophageal cancer ([place])  09/02/2021 Initial Diagnosis   Gastroesophageal cancer ([place])      HISTORY OF PRESENTING ILLNESS: Patient is in a wheelchair.  Accompanied by his wife.  Jerry Farley 76 y.o.  male with multiple comorbidities including severe nonischemic cardiomyopathy-and newly diagnosed abdominal lymphadenopathy is here for follow-up.  In the interim patient was evaluated at [facility] with endoscopy for his dysphagia-found to have a gastroesophageal mass.  Biopsy positive for malignancy.  Patient is currently accompanied by his wife to discuss further treatment options.  Patient/wife is confused about his medications/since discharge from the hospital.  Patient's current pain is well controlled on Tylenol.  Review of Systems  Constitutional:  Positive for malaise/fatigue and weight loss. Negative for chills, diaphoresis and fever.  HENT:  Negative for nosebleeds and  sore throat.   Eyes:  Negative for double vision.  Respiratory:  Negative for cough, hemoptysis, sputum production, shortness of breath and wheezing.   Cardiovascular:  Negative for chest pain, palpitations, orthopnea and leg swelling.  Gastrointestinal:  Positive for nausea. Negative for abdominal pain, blood in stool, constipation, diarrhea, heartburn, melena and vomiting.  Genitourinary:  Negative for dysuria, frequency and urgency.  Musculoskeletal:  Negative for back pain and joint pain.  Skin: Negative.  Negative for itching and rash.  Neurological:  Negative for dizziness, tingling, focal weakness, weakness and headaches.  Endo/Heme/Allergies:  Does not bruise/bleed easily.  Psychiatric/Behavioral:  Negative for depression. The patient is not nervous/anxious and does not have insomnia.     MEDICAL HISTORY:  Past Medical History:  Diagnosis Date   6948-lead    Arthritis    "maybe a little"   CAD (coronary artery disease) 01/2006   CFX stent (patent Sept '07)   Cardiac defibrillator CRT-medtronic 35/57/3220   Chronic systolic congestive heart failure ([place])    Complication of anesthesia    "problems waking up w/valium"   High cholesterol    Hypertension    Hypothyroidism    ICD (implantable cardiac defibrillator) in place 2006, 2013   Medtronic Protecta XT-DR CRT (01/2012)   NICM (nonischemic cardiomyopathy) ([place])    ejection fraction 40-45% by echo July 2012   Non-ischemic cardiomyopathy- EF 40-45% 2D 7/14 02/11/2012   Obesity (BMI 30-39.9) 08/22/2013   Ventricular tachycardia, non-sustained 2007   Amiodarone    SURGICAL HISTORY: Past Surgical History:  Procedure Laterality Date   BI-VENTRICULAR IMPLANTABLE CARDIOVERTER DEFIBRILLATOR UPGRADE N/A 02/10/2012   Procedure: BI-VENTRICULAR IMPLANTABLE CARDIOVERTER DEFIBRILLATOR UPGRADE;  Surgeon: Deboraha Sprang, MD;  Location[facility] CATH LAB;  Service: Cardiovascular;  Laterality: N/A;   BIOPSY  08/28/2021   Procedure: BIOPSY;   Surgeon: Lorenso Courier,  Grace Blight, MD;  Location: [facility] ENDOSCOPY;  Service: Gastroenterology;;   BIV ICD GENERATOR CHANGEOUT N/A 06/24/2018   Procedure: BIV ICD GENERATOR CHANGEOUT;  Surgeon: Deboraha Sprang, MD;  Location: [place] CV LAB;  Service: Cardiovascular;  Laterality: N/A;   CARDIAC CATHETERIZATION  11/30/2001   non-ischemic cardiomyopahty with high LV end-diastolic pressure; 70% distal L main stenosis, large osital stenosis 70%, 70% Cfx stenosis (Dr. Domenic Moras)   CARDIAC CATHETERIZATION  9/07   CFX stent patent [facility])   CARDIAC DEFIBRILLATOR PLACEMENT  09/2004; 02/10/12   ICD implanted in 2006 (Dr. Rito Ehrlich); EOL generator change in 01/2012 - CRT (BiV ICD) (Dr. Caryl Comes)   CORONARY ANGIOPLASTY WITH STENT PLACEMENT   01/2006   CFX    ESOPHAGOGASTRODUODENOSCOPY (EGD) WITH PROPOFOL N/A 08/28/2021   Procedure: ESOPHAGOGASTRODUODENOSCOPY (EGD) WITH PROPOFOL;  Surgeon: Sharyn Creamer, MD;  Location: [facility];  Service: Gastroenterology;  Laterality: N/A;   FOREIGN BODY REMOVAL  08/28/2021   Procedure: FOREIGN BODY REMOVAL;  Surgeon: Sharyn Creamer, MD;  Location: [facility] ENDOSCOPY;  Service: Gastroenterology;;   LEFT HEART CATH AND CORONARY ANGIOGRAPHY N/A 04/13/2019   Procedure: LEFT HEART CATH AND CORONARY ANGIOGRAPHY;  Surgeon: Jettie Booze, MD;  Location: [place] CV LAB;  Service: Cardiovascular;  Laterality: N/A;   NM MYOCAR PERF EJECTION FRACTION  05/2010   dipyridamole myoview; mod perfusion defect in apical, basal inferior, mid inferior & apical inferior regions (infarct/scar); post-stress 28%, low risk scan   TONSILLECTOMY AND ADENOIDECTOMY     "when I was a child"   TRANSTHORACIC ECHOCARDIOGRAM  01/2013   EF 40-45%, LV mildly dilated, mild LVH, mod hypokinesis of inferior myocardium, grade 1 diastolic dysfunction; mild MR   V TACH ABLATION N/A 02/13/2021   Procedure: V TACH ABLATION;  Surgeon: Evans Lance, MD;  Location: [place] CV LAB;  Service: Cardiovascular;  Laterality: N/A;     SOCIAL HISTORY: Social History   Socioeconomic History   Marital status: Married    Spouse name: Not on file   Number of children: Not on file   Years of education: Not on file   Highest education level: Not on file  Occupational History   Occupation: retired -- Armed forces operational officer  Tobacco Use   Smoking status: Former    Packs/day: 1.50    Years: 42.00    Pack years: 63.00    Types: Cigarettes    Quit date: 07/28/2003    Years since quitting: 18.1   Smokeless tobacco: Former    Types: Nurse, children's Use: Never used  Substance and Sexual Activity   Alcohol use: No   Drug use: No   Sexual activity: Never  Other Topics Concern   Not on file  Social History Narrative   Not on file   Social Determinants of Health   Financial Resource Strain: Not on file  Food Insecurity: Not on file  Transportation Needs: Not on file  Physical Activity: Not on file  Stress: Not on file  Social Connections: Not on file  Intimate Partner Violence: Not on file    FAMILY HISTORY: Family History  Problem Relation Age of Onset   Cancer Mother    Cancer Brother     ALLERGIES:  is allergic to contrast media [iodinated contrast media].  MEDICATIONS:  Current Outpatient Medications  Medication Sig Dispense Refill   acetaminophen (TYLENOL) 325 MG tablet Take 2 tablets (650 mg total) by mouth every 4 (four) hours as needed for  headache or mild pain.     amiodarone (PACERONE) 200 MG tablet Take 1 tablet (200 mg total) by mouth daily. 90 tablet 3   clopidogrel (PLAVIX) 75 MG tablet Take 1 tablet by mouth once daily 90 tablet 3   EUTHYROX 125 MCG tablet TAKE 1 TABLET BY MOUTH ONCE DAILY BEFORE BREAKFAST (Patient taking differently: Take 125 mcg by mouth daily before breakfast.) 90 tablet 1   fish oil-omega-3 fatty acids 1000 MG capsule Take 1 g by mouth daily.     Magnesium Oxide 400 MG CAPS Take 400 mg by mouth daily.     midodrine (PROAMATINE) 10 MG tablet Take 1 tablet (10 mg  total) by mouth 3 (three) times daily with meals. 90 tablet 0   Multiple Vitamin (MULTIVITAMIN) tablet Take 1 tablet by mouth daily.     ondansetron (ZOFRAN) 4 MG tablet Take 1 tablet (4 mg total) by mouth daily as needed for up to 5 days for nausea or vomiting. 5 tablet 0   oxymetazoline (AFRIN) 0.05 % nasal spray Place 1 spray into both nostrils 2 (two) times daily as needed for congestion.     pantoprazole (PROTONIX) 40 MG tablet Take 1 tablet (40 mg total) by mouth daily. 30 tablet 0   rosuvastatin (CRESTOR) 20 MG tablet Take 1 tablet by mouth once daily 90 tablet 3   aspirin EC 81 MG EC tablet Take 1 tablet (81 mg total) by mouth daily. Swallow whole. (Patient not taking: Reported on 09/02/2021) 30 tablet 11   No current facility-administered medications for this visit.    PHYSICAL EXAMINATION: ECOG PERFORMANCE STATUS: 2 - Symptomatic, <50% confined to bed  Vitals:   09/02/21 1312 09/02/21 1328  BP: (!) 84/39 (!) 71/41  Pulse: 82 92  Temp: (!) 96.7 F (35.9 C)   SpO2: 100%    Filed Weights   09/02/21 1312  Weight: 171 lb 1.6 oz (77.6 kg)   Frail-appearing male patient. Physical Exam Vitals and nursing note reviewed.  HENT:     Head: Normocephalic and atraumatic.     Mouth/Throat:     Pharynx: Oropharynx is clear.  Eyes:     Extraocular Movements: Extraocular movements intact.     Pupils: Pupils are equal, round, and reactive to light.  Cardiovascular:     Rate and Rhythm: Normal rate and regular rhythm.  Pulmonary:     Comments: Decreased breath sounds bilaterally.  Abdominal:     Palpations: Abdomen is soft.  Musculoskeletal:        General: Normal range of motion.     Cervical back: Normal range of motion.  Skin:    General: Skin is warm.  Neurological:     General: No focal deficit present.     Mental Status: He is alert and oriented to person, place, and time.  Psychiatric:        Behavior: Behavior normal.        Judgment: Judgment normal.      LABORATORY DATA:  I have reviewed the data as listed Lab Results  Component Value Date   WBC 7.5 09/02/2021   HGB 13.0 09/02/2021   HCT 39.2 09/02/2021   MCV 95.6 09/02/2021   PLT 200 09/02/2021   Recent Labs    09/25/20 1141 12/13/20 1309 08/24/21 0212 08/25/21 0350 08/26/21 0656 08/27/21 0413 08/30/21 0048 08/31/21 1022 09/02/21 1254  NA  --    < > 137 137   < > 138 138  --  136  K  --    < >  4.6 3.8   < > 3.9 5.0 4.4 4.5  CL  --    < > 103 106   < > 105 101  --  97*  CO2  --    < > 26 22   < > 24 30  --  31  GLUCOSE  --    < > 96 78   < > 96 98  --  123*  BUN  --    < > 10 7*   < > 7* 7*  --  9  CREATININE  --    < > 0.96 0.85   < > 0.86 0.87  --  0.71  CALCIUM  --    < > 8.7* 8.0*   < > 8.3* 8.7*  --  8.9  GFRNONAA  --    < > >60 >60   < > >60 >60  --  >60  PROT 6.5   < > 5.6* 4.8*  --   --   --   --  6.1*  ALBUMIN 4.2   < > 3.1* 2.5*  --   --   --   --  3.2*  AST 70*   < > 57* 44*  --   --   --   --  63*  ALT 67*   < > 74* 58*  --   --   --   --  72*  ALKPHOS 61   < > 55 44  --   --   --   --  83  BILITOT 0.6   < > 0.7 0.7  --   --   --   --  0.6  BILIDIR 0.17  --   --   --   --   --   --   --   --    < > = values in this interval not displayed.    RADIOGRAPHIC STUDIES: I have personally reviewed the radiological images as listed and agreed with the findings in the report. CT CHEST ABDOMEN PELVIS W CONTRAST  Result Date: 08/15/2021 CLINICAL DATA:  Unintended weight loss with nausea, initial encounter EXAM: CT CHEST, ABDOMEN, AND PELVIS WITH CONTRAST TECHNIQUE: Multidetector CT imaging of the chest, abdomen and pelvis was performed following the standard protocol during bolus administration of intravenous contrast. RADIATION DOSE REDUCTION: This exam was performed according to the departmental dose-optimization program which includes automated exposure control, adjustment of the mA and/or kV according to patient size and/or use of iterative reconstruction  technique. CONTRAST:  114mL OMNIPAQUE IOHEXOL 300 MG/ML SOLN. Patient was pre-medicated with 4 hour emergency premedication COMPARISON:  None. FINDINGS: CT CHEST FINDINGS Cardiovascular: Thoracic aorta demonstrates atherosclerotic calcifications without aneurysmal dilatation. Coronary calcifications are seen. No cardiac enlargement is noted. Pulmonary artery as visualized is within normal limits. Defibrillator device is noted. Mediastinum/Nodes: Thoracic inlet is within normal limits. There is a 12 mm short axis lymph node identified in the right paratracheal region. No other sizable hilar or mediastinal adenopathy is noted. The esophagus appears within normal limits. Lungs/Pleura: Lungs are well aerated bilaterally and demonstrate emphysematous change. No focal confluent infiltrate or sizable effusion is seen. Mild tree-in-bud changes are noted throughout both lungs likely related to atypical pneumonia. Musculoskeletal: Degenerative changes of the thoracic spine are noted. No acute rib abnormality is seen. CT ABDOMEN PELVIS FINDINGS Hepatobiliary: Liver demonstrates some scattered calcifications likely related to granulomas. Mild periportal edema is noted which may be related volume overload or hepatic inflammatory change.  Gallbladder is well distended with dependent gallstones. No wall thickening or pericholecystic fluid is noted. Pancreas: Unremarkable. No pancreatic ductal dilatation or surrounding inflammatory changes. Spleen: Normal in size without focal abnormality. Adrenals/Urinary Tract: Adrenal glands are within normal limits. Kidneys demonstrate a normal enhancement pattern bilaterally. Small cyst is noted in the lower pole of right kidney. No calculi or obstructive changes are seen. The bladder is within normal limits. Stomach/Bowel: Diverticular change of the colon is noted. No evidence of diverticulitis is seen. No obstructive or inflammatory changes are noted. The appendix is not well visualized. No  inflammatory changes to suggest appendicitis are noted. The stomach small bowel appear within normal limits. Vascular/Lymphatic: Atherosclerotic calcifications are identified. Scattered Peri aortic lymph nodes are noted with central decreased attenuation consistent with necrosis. The largest of these measures 10 mm in short axis. Adjacent to the pancreatic head and extending superiorly towards the gastroesophageal junction there are multiple areas of fluid attenuation with peripheral enhancement likely related to centrally necrotic lymph nodes. Adjacent to the pancreatic head and portacaval space these measure 16 mm in short axis. The total size of the area surrounding the gastroesophageal junction measures 4.8 x 2.5 cm. Additionally some right iliac chain nodes are noted measuring up to 9 mm. Reproductive: Prostate is unremarkable. Other: No abdominal wall hernia or abnormality. No abdominopelvic ascites. Musculoskeletal: No acute or significant osseous findings. IMPRESSION: Multiple lymph nodes are noted surrounding the gastroesophageal junction, extending towards the pancreatic head, involving the portacaval space and pre portal region as well as the intra-aortocaval and periaortic and right iliac chain. These demonstrate central decreased attenuation suspicious for necrosis. A definitive primary neoplasm is not identified on this exam. PET-CT and tissue sampling would be helpful. Diverticulosis without diverticulitis. Cholelithiasis. Tree-in-bud changes within the lungs bilaterally likely related to atypical pneumonia. Electronically Signed   By: Inez Catalina M.D.   On: 08/15/2021 03:01   DG CHEST PORT 1 VIEW  Result Date: 08/26/2021 CLINICAL DATA:  Dyspnea, nausea EXAM: PORTABLE CHEST 1 VIEW COMPARISON:  CT chest dated August 15, 2021 FINDINGS: The heart size and mediastinal contours are within normal limits. Atherosclerotic calcification of aortic arch. Pacemaker leads terminating in the right atrium,  right ventricle and coronary sinus. Low lung volumes. Bilateral hazy lung opacities, which may be secondary to prominence of the vessels due to low lung volumes are infectious/inflammatory process. No focal consolidation or pleural effusion. The visualized skeletal structures are unremarkable. IMPRESSION: Low lung volumes with hazy lung opacities bilaterally, which may represent prominent vessels due to low lung volumes. Differential includes infectious/inflammatory process. No focal consolidation or large pleural effusion. Electronically Signed   By: Keane Police D.O.   On: 08/26/2021 08:08   ECHOCARDIOGRAM COMPLETE  Result Date: 08/11/2021    ECHOCARDIOGRAM REPORT   Patient Name:   JUANCARLOS CRESCENZO Date of Exam: 08/11/2021 Medical Rec #:  [id number]       Height:       67.0 in Accession #:    [id number]      Weight:       176.0 lb Date of Birth:  08-09-1945       BSA:          1.915 m Patient Age:    38 years        BP:           101/61 mmHg Patient Gender: M               HR:  75 bpm. Exam Location:  [facility] Procedure: 2D Echo, 3D Echo, Cardiac Doppler and Color Doppler Indications:    I47.20 Ventricular tachycardia  History:        Patient has prior history of Echocardiogram examinations, most                 recent 06/27/2015. NICM and CHF, CAD, Defibrillator; Risk                 Factors:HLD.  Sonographer:    Marygrace Drought RCS Referring Phys: [id number] [place]  1. Left ventricular ejection fraction, by estimation, is 20 to 25%. The left ventricle has severely decreased function. The left ventricle demonstrates global hypokinesis. Left ventricular diastolic parameters are consistent with Grade I diastolic dysfunction (impaired relaxation).  2. Right ventricular systolic function is mildly reduced. The right ventricular size is normal. There is normal pulmonary artery systolic pressure.  3. The mitral valve is normal in structure. Mild mitral valve regurgitation. No evidence of  mitral stenosis.  4. The aortic valve is tricuspid. Aortic valve regurgitation is mild. Aortic valve sclerosis/calcification is present, without any evidence of aortic stenosis.  5. The inferior vena cava is normal in size with greater than 50% respiratory variability, suggesting right atrial pressure of 3 mmHg. FINDINGS  Left Ventricle: Left ventricular ejection fraction, by estimation, is 20 to 25%. The left ventricle has severely decreased function. The left ventricle demonstrates global hypokinesis. The left ventricular internal cavity size was normal in size. There is no left ventricular hypertrophy. Left ventricular diastolic parameters are consistent with Grade I diastolic dysfunction (impaired relaxation). Right Ventricle: The right ventricular size is normal. Right vetricular wall thickness was not well visualized. Right ventricular systolic function is mildly reduced. There is normal pulmonary artery systolic pressure. The tricuspid regurgitant velocity is 1.94 m/s, and with an assumed right atrial pressure of 3 mmHg, the estimated right ventricular systolic pressure is 13.0 mmHg. Left Atrium: Left atrial size was normal in size. Right Atrium: Right atrial size was normal in size. Pericardium: There is no evidence of pericardial effusion. Mitral Valve: The mitral valve is normal in structure. Mild mitral valve regurgitation. No evidence of mitral valve stenosis. Tricuspid Valve: The tricuspid valve is normal in structure. Tricuspid valve regurgitation is trivial. Aortic Valve: The aortic valve is tricuspid. Aortic valve regurgitation is mild. Aortic valve sclerosis/calcification is present, without any evidence of aortic stenosis. Pulmonic Valve: The pulmonic valve was not well visualized. Pulmonic valve regurgitation is trivial. Aorta: The aortic root and ascending aorta are structurally normal, with no evidence of dilitation. Venous: The inferior vena cava is normal in size with greater than 50%  respiratory variability, suggesting right atrial pressure of 3 mmHg. IAS/Shunts: The interatrial septum was not well visualized.  LEFT VENTRICLE PLAX 2D LVIDd:         5.00 cm LVIDs:         4.50 cm LV PW:         0.80 cm LV IVS:        0.70 cm LVOT diam:     1.90 cm LV SV:         195 LV SV Index:   102 LVOT Area:     2.84 cm  RIGHT VENTRICLE RV Basal diam:  2.50 cm LEFT ATRIUM         Index LA diam:    4.30 cm 2.25 cm/m  AORTIC VALVE LVOT VTI:    0.688 m MV E  velocity: 30.90 cm/s    TRICUSPID VALVE MV A velocity: 7280.00 cm/s  TR Peak grad:   15.1 mmHg MV E/A ratio:  0.00          TR Vmax:        194.00 cm/s                               SHUNTS                              Systemic VTI:  0.69 m                              Systemic Diam: 1.90 cm Oswaldo Milian MD Electronically signed by Oswaldo Milian MD Signature Date/Time: 08/11/2021/4:32:35 PM    Final    CUP PACEART INCLINIC DEVICE CHECK  Result Date: 08/07/2021 CRT-D device check in office. Thresholds and sensing consistent with previous device measurements. Lead impedance trends stable over time. No mode switch episodes recorded. No ventricular arrhythmia episodes recorded. Patient bi-ventricularly pacing 89% of the time. Device programmed with appropriate safety margins. Heart failure diagnostics reviewed and trends are stable for patient. No changes made this session. Estimated longevity _2.6 years__.  Patient enrolled in remote follow up. Plan to check device remotely in 3 months se note for full discussion suspect the NSVT events are what is reducing his BP%   ASSESSMENT & PLAN:   Gastroesophageal cancer ([place]) #Gastroesophageal adenocarcinoma -with clinically metastatic abdominal Lymphadenopathy--gastro esophagus/pancreas/portocaval space/intra-aortocaval and periaortic and right iliac chain-suspicious for necrosis based on imaging.  Await PET scan for further staging as planned tomorrow 2/08.  #I reviewed the pathology; the  likely clinical stage as stage IV esophagus cancer.  Discussed unfortunately-patient's disease is most likely incurable; and treatments are palliative.   #I had a long discussion the patient wife regarding the multiple treatment options including-chemoradiation on a palliative basis vs-chemotherapy alone again on a palliative basis.  Given patient's extensive abdominal adenopathy-/stage IV-patient would not be candidate for any curative resection especially context of his significant cardiac comorbidities/poor performance status etc.  #I had a long discussion again-my significant concerns for potential side effects of either chemotherapy or radiation given his poor performance status/chronic hypotension blood pressures 77O systolic/and significant cardiomyopathy.  Discussed in general the life expectancy of metastatic esophagus cancer is approximately 12 months with treatments.  #I also discussed regarding hospice [however patient's wife had a "bad" experience with hospice/being her mother's care for lung cancer.].  Discussed regarding palliative care evaluation/recommend evaluation with Josh at next visit.  #Nonischemic cardiomyopathy /status post defibrillator/ablation.  Hypotension: 24M to 35T systolic/diastolic 61W to 43X [chronic]-discussed with hospitalist service/Dr. Raynelle Bring with discontinuation of aspirin/Coreg.  Continue with Plavix/amiodarone.   # DISPOSITION: # follow up on 2/17; MD; labs- cbc/cmp # follow up H+Jsoh on 2/17 re: pallaitive care-dr.B  # I reviewed the blood work- with the patient in detail; also reviewed the imaging independently [as summarized above]; and with the patient in detail.   # 40 minutes face-to-face with the patient discussing the above plan of care; more than 50% of time spent on prognosis/ natural history; counseling and coordination.   Cc; Dr.Johnston/Dr.Kline     All questions were answered. The patient knows to call the clinic with any problems,  questions or concerns.       Lenetta Quaker  Ann Lions, MD 09/02/2021 6:27 PM

## 2021-09-03 ENCOUNTER — Encounter
Admission: RE | Admit: 2021-09-03 | Discharge: 2021-09-03 | Disposition: A | Discharge status: Home / Self Care | Payer: Medicare HMO | Source: Ambulatory Visit | Attending: Internal Medicine | Admitting: Internal Medicine

## 2021-09-03 DIAGNOSIS — C7989 Secondary malignant neoplasm of other specified sites: Secondary | ICD-10-CM | POA: Diagnosis not present

## 2021-09-03 DIAGNOSIS — R1900 Intra-abdominal and pelvic swelling, mass and lump, unspecified site: Secondary | ICD-10-CM | POA: Diagnosis not present

## 2021-09-03 DIAGNOSIS — C159 Malignant neoplasm of esophagus, unspecified: Secondary | ICD-10-CM | POA: Diagnosis not present

## 2021-09-03 DIAGNOSIS — R590 Localized enlarged lymph nodes: Secondary | ICD-10-CM | POA: Diagnosis not present

## 2021-09-03 LAB — GLUCOSE, CAPILLARY: Glucose-Capillary: 98 mg/dL (ref 70–99)

## 2021-09-03 MED ORDER — FLUDEOXYGLUCOSE F - 18 (FDG) INJECTION
10.1600 | Freq: Once | INTRAVENOUS | Status: AC | PRN
  Administered 2021-09-03: 10.16 via INTRAVENOUS

## 2021-09-04 ENCOUNTER — Other Ambulatory Visit: Payer: Self-pay

## 2021-09-04 DIAGNOSIS — C160 Malignant neoplasm of cardia: Secondary | ICD-10-CM

## 2021-09-04 NOTE — Progress Notes (Signed)
I spoke to patients wife regarding the results of the pet scan concerning for advanced cancer; and given his poor performance status-poor candidate for chemotherapy. Will discuss further next appointment  Wife states that her company will fax FMLA forms to our office. I will inform the staff.  Patient will keep appointment as planned for next week.

## 2021-09-05 ENCOUNTER — Inpatient Hospital Stay: Payer: Medicare HMO

## 2021-09-05 ENCOUNTER — Telehealth: Payer: Self-pay | Admitting: *Deleted

## 2021-09-05 DIAGNOSIS — E639 Nutritional deficiency, unspecified: Secondary | ICD-10-CM

## 2021-09-05 DIAGNOSIS — C160 Malignant neoplasm of cardia: Secondary | ICD-10-CM

## 2021-09-05 NOTE — Progress Notes (Signed)
Nutrition Assessment   Reason for Assessment:  Verbal referral received from [facility], RN    ASSESSMENT:  76 year old male with stage IV gastroesophageal cancer.  Past medical history of CAD, HLD, cardiomyopathy, defibrillator, CHF.  Plan of care to be determined.  Spoke with wife via phone this afternoon.  Wife reports that patient is on full liquid diet per recommendation from most recent hospital visit.  She has been preparing cream of chicken soup for patient, ice cream jello.  Patient drinking maybe 1 1/2 ensure original shakes.  Says that he does not want to drink anymore due to having to get up during the night to urinate.    Inpatient RD, Dimas Millin saw patient on 1/29 and determined severe malnutrition at that time.     Nutrition Focused Physical Exam: unable to perform   Medications: fish oil, magox, MVI, zofran, protonix   Labs: reviewed from 2/7   Anthropometrics:   Height: 67 inches Weight: 171 lb 1.6 oz on 2/7 (cancer center) UBW: 215 lb per wife Per chart 202 lb 09/25/2020 BMI: 26  20% weight loss in the last year, significant 6% weight loss in the last month, significant   Estimated Energy Needs  Kcals: 2300-2700 Protein: 115-135 g Fluid: 2300-2700   NUTRITION DIAGNOSIS: Inadequate oral intake related to esophageal cancer as evidenced by 6% weight loss in the last month and poor intake, limited to liquids   MALNUTRITION DIAGNOSIS: Likely continues to be severe from inpatient assessment on 1/29 as no improvement intake   INTERVENTION:  Discussed full liquid diet and will mail handout for items to include Recommend 350 calorie shake or higher and examples provided to wife. Coupons will be mailed and samples provided for wife to come pick up.  Recommend 4-6 per day, if possible with other liquids Will mail recipes of smoothies and other puree foods.   Consider feeding tube placement if aligns with goals of care as intake extremely limited and  severe weight loss.   Contact information provided to wife   MONITORING, EVALUATION, GOAL: weight trends, intake, poc   Next Visit: Tuesday, Feb 21 phone call  Renn Dirocco B. Zenia Resides, [place], [place] Registered Dietitian [phone] (mobile)

## 2021-09-05 NOTE — Telephone Encounter (Signed)
Fmla papers received at 1453-

## 2021-09-05 NOTE — Telephone Encounter (Signed)
Spoke with patient's wife. I explained that our office has not yet rcvd the fmla papers. She stated she called [facility] yesterday and provided the dept with the cancer ctr fax #s. I will touch base with her by Monday, if our office has not yet rcvd the forms.  Wife prefers her FMLA papers to start 09/06/21 through 10/06/21 continuous leave. Her employer would only grant cont. Leave through 3/13 for now. She normally works [employer] Wednesdays (5-6 hours day shifts).    Wife voiced concerns about a 'red spot' about the size of a half dollar on her husband's left hip. Pt is at risk for pressure ulcers as he remains bedfast most of the time. Wife has encouraged patient to get out of the bed and ambulate. She stated pt can get out of the bed, but he just wants to "often lay in bed for prolonged periods of time." Educated provided on preventing bedsores. I encourage wife to pick up a seat cushion for the chair. Wife will also see if a foam mattress pad/egg crate is available at the local store such as [employer] for her queen size bed. Also discussed sleeping ergonomics with pillows under the legs to prevent. Wife provided with Ph # give per her request to [employer] in [place] to see stock availability for the seat cushion. She also has barrier cream at home that she will apply to the pressure site to prevent further skin break down.  RN discussed other strategies with wife for pressure ulcer prevention including good pericare/skin care, maintaining good nutrition/ fluid intake.   Pt is at risk for falls due to weakness although patient has not had any falls. They have a walker for pt to use, but he doesn't "like to use it."  He may benefit from a referral with Gwenette Greet in the near future. I made Dr. Rogue Bussing aware of this. Briefly discussed the OT screening with Gwenette Greet with wife, who is interested in this referral in the future. I explained to her that Gwenette Greet is only here in the clinic on  Wednesday mornings. Wife/pt would like to wait until patient is evaluated on 2/17 with Dr. B to make this referral.    Wife expressed that patient is struggling to intake foods. Pt gets easily choked with foods and she is having to puree everything. Pt is newly dx with gastroesophgeal ca. Discussed dietary consult with Desmond Lope- Wife is very interested in having a phone call from [place]. Next apt with Dr. Jacinto Reap is on 2/17 and Desmond Lope is not in the office. I have sent a message to [facility] to see if she can touch base via phone call with the patient's wife to provide further feedback on dietary mgmt.  Pt is getting at least 2 ensures in a day, but wife voiced concerns at the cost of the ensure/boost. She is interested in coupons and samples of ensure if available. I also explained to wife that ensure clear is available on the market as well. Dietary referral entered to [facility].

## 2021-09-05 NOTE — Telephone Encounter (Signed)
Per Dr. Rogue Bussing- wife will have her fmla faxed to the cancer center. Wife is requesting fmla forms to be completed by the 17'th as she wants to pick these forms up at that time. However, our clinic has not yet rcvd these forms.  Shirlean Mylar, if you get the fmla via fax, please let me know.

## 2021-09-08 ENCOUNTER — Encounter: Payer: Self-pay | Admitting: Internal Medicine

## 2021-09-08 ENCOUNTER — Other Ambulatory Visit: Payer: Self-pay

## 2021-09-08 ENCOUNTER — Ambulatory Visit: Payer: Medicare HMO | Admitting: Internal Medicine

## 2021-09-08 VITALS — BP 90/54 | HR 80 | Ht 67.0 in | Wt 166.8 lb

## 2021-09-08 DIAGNOSIS — I472 Ventricular tachycardia, unspecified: Secondary | ICD-10-CM | POA: Diagnosis not present

## 2021-09-08 DIAGNOSIS — I428 Other cardiomyopathies: Secondary | ICD-10-CM | POA: Diagnosis not present

## 2021-09-08 DIAGNOSIS — Z95810 Presence of automatic (implantable) cardiac defibrillator: Secondary | ICD-10-CM

## 2021-09-08 DIAGNOSIS — C160 Malignant neoplasm of cardia: Secondary | ICD-10-CM

## 2021-09-08 DIAGNOSIS — I5042 Chronic combined systolic (congestive) and diastolic (congestive) heart failure: Secondary | ICD-10-CM | POA: Diagnosis not present

## 2021-09-08 MED ORDER — ONDANSETRON HCL 4 MG PO TABS: 4.0000 mg | ORAL_TABLET | Freq: Three times a day (TID) | ORAL | 0 refills | Status: AC | PRN

## 2021-09-08 NOTE — Progress Notes (Signed)
Patient Care Team: Baxter Hire, MD as PCP - General (Internal Medicine) Cammie Sickle, MD as Consulting Physician (Oncology)   HPI  Jerry Farley is a 76 y.o. male Seen in followup for ICD implanted for polymorphic ventricular tachycardia   remotely for which he underwent device generator replacement 7/13. ERI and underwent generator replacement 10/19 and for reasons that I do not recall received a St Jude replacement of a Medtronic device History of 6949-lead which was revised the time of CRT upgrade receiving an atrial lead and an LV lead.   coronary artery disease with prior stenting of his circumflex. Surveillance laboratories on amiodarone demonstrated elevated transaminases and his amiodarone has been  held.  Back On Amiodarone and adjunctive ranolazine   7/22 underwent VT ablation with elimination of inducible ventricular tachycardia; significant post ablation VT burden and when seen 9/22, especially given the visual issues that found his prior VT ablation, unable to read letters etc., elected to pursue aggressive medical therapy with reloading of amiodarone and anticipate consideration of an adjunctive antiarrhythmic theray   Recently admitted at [facility] from failure to thrive.  Cardiac meds were discontinued because of hypotension and he was started on ProAmatine.  He remains on amiodarone at 200 mg a day.  Concurrent with the above, poor appetite, losing weight, some retching, CT scan demonstrated significant lymphadenopathy concerning for malignancy.  This is also been confirmed by PET scanning and evaluation is ongoing with oncology   Largely nonambulatory at this point.  No chest pain.  Mild shortness of breath.  No interval palpitations although -intercurrent asymptomatic ventricular tachycardia treated with ATP  Lightheadedness is a problem.  All of his medications were stopped as noted; he is mostly in the chair  Able to eat only liquids at this  point.  Scheduled to meet on Friday to discuss further treatment options including chemo and radiation   DATE TEST EF   2007 LHC 25% Cx Stent   2011 myoview   29 % Inferoapical scar  7/14 Echo    40-45% % LAE mild  12/16 Echo   40-45%   9/20 LHC 25-35% Cx stent patent O/w nonobstructive      Date Cr K TSH Hgb LFTs PFTs  11/16    0.48  28    6/17    1.6  30    8/18   4.96 (on synthroid)  30   12/18 1.1 4.8 2.19  33   8/19 1.0 4.6 3.06  32   1/20 1.1 4.4 5.95  30   9/20 1.21 4.3 4.22  41   2/21 1.2 4.4 9.847 ( dose 88>>100) 13.5 37   6/21 1.2` 4.4 6.7     1/22   3.85<<8.17  109<<88    2/22 (CE) 1.0 4.3 2.507 13.1 65   4/22 (CE) 1.1 4.1   88   9/22 (CE) 1.1 4.3 1.999  31        Past Medical History:  Diagnosis Date   6948-lead    Arthritis    "maybe a little"   CAD (coronary artery disease) 01/2006   CFX stent (patent Sept '07)   Cardiac defibrillator CRT-medtronic 42/59/5638   Chronic systolic congestive heart failure ([place])    Complication of anesthesia    "problems waking up w/valium"   High cholesterol    Hypertension    Hypothyroidism    ICD (implantable cardiac defibrillator) in place 2006, 2013   Medtronic Protecta XT-DR CRT (  01/2012)   NICM (nonischemic cardiomyopathy) ([place])    ejection fraction 40-45% by echo July 2012   Non-ischemic cardiomyopathy- EF 40-45% 2D 7/14 02/11/2012   Obesity (BMI 30-39.9) 08/22/2013   Ventricular tachycardia, non-sustained 2007   Amiodarone    Past Surgical History:  Procedure Laterality Date   BI-VENTRICULAR IMPLANTABLE CARDIOVERTER DEFIBRILLATOR UPGRADE N/A 02/10/2012   Procedure: BI-VENTRICULAR IMPLANTABLE CARDIOVERTER DEFIBRILLATOR UPGRADE;  Surgeon: Deboraha Sprang, MD;  Location: [facility] CATH LAB;  Service: Cardiovascular;  Laterality: N/A;   BIOPSY  08/28/2021   Procedure: BIOPSY;  Surgeon: Sharyn Creamer, MD;  Location: [facility] ENDOSCOPY;  Service: Gastroenterology;;   BIV ICD GENERATOR CHANGEOUT N/A 06/24/2018   Procedure: BIV  ICD GENERATOR CHANGEOUT;  Surgeon: Deboraha Sprang, MD;  Location: [place] CV LAB;  Service: Cardiovascular;  Laterality: N/A;   CARDIAC CATHETERIZATION  11/30/2001   non-ischemic cardiomyopahty with high LV end-diastolic pressure; 54% distal L main stenosis, large osital stenosis 70%, 70% Cfx stenosis (Dr. Domenic Moras)   CARDIAC CATHETERIZATION  9/07   CFX stent patent [facility])   CARDIAC DEFIBRILLATOR PLACEMENT  09/2004; 02/10/12   ICD implanted in 2006 (Dr. Rito Ehrlich); EOL generator change in 01/2012 - CRT (BiV ICD) (Dr. Caryl Comes)   CORONARY ANGIOPLASTY WITH STENT PLACEMENT   01/2006   CFX    ESOPHAGOGASTRODUODENOSCOPY (EGD) WITH PROPOFOL N/A 08/28/2021   Procedure: ESOPHAGOGASTRODUODENOSCOPY (EGD) WITH PROPOFOL;  Surgeon: Sharyn Creamer, MD;  Location: [place];  Service: Gastroenterology;  Laterality: N/A;   FOREIGN BODY REMOVAL  08/28/2021   Procedure: FOREIGN BODY REMOVAL;  Surgeon: Sharyn Creamer, MD;  Location: [facility] ENDOSCOPY;  Service: Gastroenterology;;   LEFT HEART CATH AND CORONARY ANGIOGRAPHY N/A 04/13/2019   Procedure: LEFT HEART CATH AND CORONARY ANGIOGRAPHY;  Surgeon: Jettie Booze, MD;  Location: [place] CV LAB;  Service: Cardiovascular;  Laterality: N/A;   NM MYOCAR PERF EJECTION FRACTION  05/2010   dipyridamole myoview; mod perfusion defect in apical, basal inferior, mid inferior & apical inferior regions (infarct/scar); post-stress 28%, low risk scan   TONSILLECTOMY AND ADENOIDECTOMY     "when I was a child"   TRANSTHORACIC ECHOCARDIOGRAM  01/2013   EF 40-45%, LV mildly dilated, mild LVH, mod hypokinesis of inferior myocardium, grade 1 diastolic dysfunction; mild MR   V TACH ABLATION N/A 02/13/2021   Procedure: V TACH ABLATION;  Surgeon: Evans Lance, MD;  Location: [place] CV LAB;  Service: Cardiovascular;  Laterality: N/A;    Current Outpatient Medications  Medication Sig Dispense Refill   acetaminophen (TYLENOL) 325 MG tablet Take 2 tablets (650 mg total) by  mouth every 4 (four) hours as needed for headache or mild pain.     amiodarone (PACERONE) 200 MG tablet Take 1 tablet (200 mg total) by mouth daily. 90 tablet 3   clopidogrel (PLAVIX) 75 MG tablet Take 1 tablet by mouth once daily 90 tablet 3   EUTHYROX 125 MCG tablet TAKE 1 TABLET BY MOUTH ONCE DAILY BEFORE BREAKFAST (Patient taking differently: Take 125 mcg by mouth daily before breakfast.) 90 tablet 1   fish oil-omega-3 fatty acids 1000 MG capsule Take 1 g by mouth daily.     Magnesium Oxide 400 MG CAPS Take 400 mg by mouth daily.     midodrine (PROAMATINE) 10 MG tablet Take 1 tablet (10 mg total) by mouth 3 (three) times daily with meals. 90 tablet 0   Multiple Vitamin (MULTIVITAMIN) tablet Take 1 tablet by mouth daily.     oxymetazoline (AFRIN)  0.05 % nasal spray Place 1 spray into both nostrils 2 (two) times daily as needed for congestion.     pantoprazole (PROTONIX) 40 MG tablet Take 1 tablet (40 mg total) by mouth daily. 30 tablet 0   rosuvastatin (CRESTOR) 20 MG tablet Take 1 tablet by mouth once daily 90 tablet 3   No current facility-administered medications for this visit.    Allergies  Allergen Reactions   Contrast Media [Iodinated Contrast Media] Other (See Comments)    Patient states "I don't remember exactly what happens but I know I cannot have contrast for testing."     Review of Systems negative except from HPI and PMH  Physical Exam BP (!) 90/54    Pulse 80    Ht 5\' 7"  (1.702 m)    Wt 166 lb 12.8 oz (75.7 kg)    SpO2 91%    BMI 26.12 kg/m  Well developed and standing in no acute distress  HENT normal Neck supple   Clear Device pocket well healed; without hematoma or erythema.  There is no tethering  Regular rate and rhythm, n murmur Abd-soft with active BS No Clubbing cyanosis   edema Skin-warm and dry A & Oriented  Grossly normal sensory and motor function  ECG sinus with P synchronous pacing at 80  Assessment and  Plan  Polymorphic ventricular  tachycardia-- intercurrent nonsustained episodes  Ischemic cardiomyopathy  Ventricular tachycardia-monomorphic-paced terminated  Coronary artery disease with prior stenting  High Risk Medication Surveillance Amiodarone  Elevated transaminases  ? Amio    Syncope  Heart failure-chronic-mixed  CRT-D St Jude from Medtronic   Hypothyroidism treated ( dose recently changed from 100>>112)   Visual changes following ablation  Metastatic cancer  End-of-life discussion   Recurrent ventricular tachycardia.  We will continue amiodarone 200 mg a day.  No angina, but we will continue his Plavix at 75 mg a day and his Crestor.  Hypotension is a big problem; he is currently on ProAmatine which helped significantly.  Can cause some nausea.  We discussed the role of thigh sleeves and abdominal compression as nonpharmacological adjuncts  Lengthy discussion regarding end-of-life.  With his stage IV cancer, we discussed that there will be a time when we want to inactivate the defibrillator.  He will let us know after he meets with oncology on Friday.  At this juncture, from the words they are using, that radiation therapy will "burn "and chemotherapy will be "terrible "I encouraged them to look for the benefits that they could potentially accrue i.e. being able to eat and if the therapies really are intolerable then a decision can be made then.  For now he would like his defibrillator left arm

## 2021-09-08 NOTE — Research (Signed)
Received a wireless message from patients wife stating that he does not want to participate in the MTG 015 MT1410-A blood study protocol now. Patient was consented and scheduled to have his labs drawn on 09/12/2021. Orders have been deleted for research labs at this time per request. Research nurse returned call to the patient and spoke with his wife [name] to let her know that I received the message and the orders have been taken out for Friday. [name], CRS was notified for study documentation purposes.  [name], RN 09/08/21 2:38 PM

## 2021-09-08 NOTE — Patient Instructions (Signed)
Medication Instructions:  Your physician recommends that you continue on your current medications as directed. Please refer to the Current Medication list given to you today.  *If you need a refill on your cardiac medications before your next appointment, please call your pharmacy*   Lab Work: None ordered.  If you have labs (blood work) drawn today and your tests are completely normal, you will receive your results only by: [place] (if you have MyChart) OR A paper copy in the mail If you have any lab test that is abnormal or we need to change your treatment, we will call you to review the results.   Testing/Procedures: None ordered.    Follow-Up: At [facility], you and your health needs are our priority.  As part of our continuing mission to provide you with exceptional heart care, we have created designated Provider Care Teams.  These Care Teams include your primary Cardiologist (physician) and Advanced Practice Providers (APPs -  Physician Assistants and Nurse Practitioners) who all work together to provide you with the care you need, when you need it.  We recommend signing up for the patient portal called "MyChart".  Sign up information is provided on this After Visit Summary.  MyChart is used to connect with patients for Virtual Visits (Telemedicine).  Patients are able to view lab/test results, encounter notes, upcoming appointments, etc.  Non-urgent messages can be sent to your provider as well.   To learn more about what you can do with MyChart, go to [email or web address].    Your next appointment:   11/10/2021 with Tommye Standard, PA-C at 915am

## 2021-09-09 NOTE — Telephone Encounter (Signed)
Fmla completed on 09/08/21. Given to Dr. Sharmaine Base team for md signature.

## 2021-09-11 ENCOUNTER — Telehealth: Payer: Self-pay | Admitting: *Deleted

## 2021-09-11 NOTE — Telephone Encounter (Signed)
Per wife, O2 is in the 48's, Dr. B advised to go to the ED. If admitted, he will see him in the hospital.

## 2021-09-11 NOTE — Telephone Encounter (Signed)
Per Dr B, patient needs to go to ER, I called wife and she states he has refused to go, he does not have good response at that ER, he sits for 14 hours and then they send him home. I told her I cannot make him go, and that the only option is that he go or keep his appointment tomorrow She said she will tell him that we want him to go and see what he says

## 2021-09-11 NOTE — Telephone Encounter (Signed)
Fmla faxed on 2/14 to [facility]. Copy sent to HIM

## 2021-09-11 NOTE — Telephone Encounter (Addendum)
Patient wife called reporting that patient O2 sat is staying in the 58's today she is asking if he needs to be seen today or wait for his appointment tomorrow, but thinks that he is needing O2 ordered. She states if he stays upright it helps sometimes. Please advise, should I tell her to take him to ER?

## 2021-09-11 NOTE — Telephone Encounter (Signed)
Jerry Farley spoke with the wife again:  called wife and she states he has refused to go, he does not have good response at that ER, he sits for 14 hours and then they send him home. I told her I cannot make him go, and that the only option is that he go or keep his appointment tomorrow She said she will tell him that we want him to go and see what he says

## 2021-09-11 NOTE — Telephone Encounter (Signed)
Per Dr. B: If he does not want to go, I guess that is his choice. We could start on the oxygen after we see him in the clinic tomorrow.Marland Kitchen

## 2021-09-12 ENCOUNTER — Inpatient Hospital Stay (HOSPITAL_BASED_OUTPATIENT_CLINIC_OR_DEPARTMENT_OTHER): Payer: Medicare HMO | Admitting: Hospice and Palliative Medicine

## 2021-09-12 ENCOUNTER — Encounter: Payer: Medicare HMO | Admitting: Hospice and Palliative Medicine

## 2021-09-12 ENCOUNTER — Other Ambulatory Visit: Payer: Medicare HMO

## 2021-09-12 ENCOUNTER — Inpatient Hospital Stay (HOSPITAL_BASED_OUTPATIENT_CLINIC_OR_DEPARTMENT_OTHER): Payer: Medicare HMO | Admitting: Internal Medicine

## 2021-09-12 ENCOUNTER — Encounter: Payer: Self-pay | Admitting: Internal Medicine

## 2021-09-12 ENCOUNTER — Ambulatory Visit: Payer: Medicare HMO | Admitting: Internal Medicine

## 2021-09-12 ENCOUNTER — Inpatient Hospital Stay: Payer: Medicare HMO

## 2021-09-12 ENCOUNTER — Other Ambulatory Visit: Payer: Self-pay

## 2021-09-12 DIAGNOSIS — Z515 Encounter for palliative care: Secondary | ICD-10-CM | POA: Diagnosis not present

## 2021-09-12 DIAGNOSIS — I428 Other cardiomyopathies: Secondary | ICD-10-CM | POA: Diagnosis not present

## 2021-09-12 DIAGNOSIS — Z87891 Personal history of nicotine dependence: Secondary | ICD-10-CM | POA: Diagnosis not present

## 2021-09-12 DIAGNOSIS — C160 Malignant neoplasm of cardia: Secondary | ICD-10-CM | POA: Diagnosis not present

## 2021-09-12 DIAGNOSIS — I110 Hypertensive heart disease with heart failure: Secondary | ICD-10-CM | POA: Diagnosis not present

## 2021-09-12 DIAGNOSIS — I9589 Other hypotension: Secondary | ICD-10-CM | POA: Diagnosis not present

## 2021-09-12 DIAGNOSIS — Z79899 Other long term (current) drug therapy: Secondary | ICD-10-CM | POA: Diagnosis not present

## 2021-09-12 DIAGNOSIS — E039 Hypothyroidism, unspecified: Secondary | ICD-10-CM | POA: Diagnosis not present

## 2021-09-12 LAB — CBC WITH DIFFERENTIAL/PLATELET
Abs Immature Granulocytes: 0.03 10*3/uL (ref 0.00–0.07)
Basophils Absolute: 0 10*3/uL (ref 0.0–0.1)
Basophils Relative: 0 %
Eosinophils Absolute: 0.2 10*3/uL (ref 0.0–0.5)
Eosinophils Relative: 2 %
HCT: 39.2 % (ref 39.0–52.0)
Hemoglobin: 13.1 g/dL (ref 13.0–17.0)
Immature Granulocytes: 0 %
Lymphocytes Relative: 10 %
Lymphs Abs: 0.9 10*3/uL (ref 0.7–4.0)
MCH: 32.1 pg (ref 26.0–34.0)
MCHC: 33.4 g/dL (ref 30.0–36.0)
MCV: 96.1 fL (ref 80.0–100.0)
Monocytes Absolute: 0.7 10*3/uL (ref 0.1–1.0)
Monocytes Relative: 8 %
Neutro Abs: 6.9 10*3/uL (ref 1.7–7.7)
Neutrophils Relative %: 80 %
Platelets: 210 10*3/uL (ref 150–400)
RBC: 4.08 MIL/uL — ABNORMAL LOW (ref 4.22–5.81)
RDW: 14.1 % (ref 11.5–15.5)
WBC: 8.6 10*3/uL (ref 4.0–10.5)
nRBC: 0 % (ref 0.0–0.2)

## 2021-09-12 LAB — COMPREHENSIVE METABOLIC PANEL
ALT: 108 U/L — ABNORMAL HIGH (ref 0–44)
AST: 152 U/L — ABNORMAL HIGH (ref 15–41)
Albumin: 3.1 g/dL — ABNORMAL LOW (ref 3.5–5.0)
Alkaline Phosphatase: 333 U/L — ABNORMAL HIGH (ref 38–126)
Anion gap: 13 (ref 5–15)
BUN: 14 mg/dL (ref 8–23)
CO2: 27 mmol/L (ref 22–32)
Calcium: 8.7 mg/dL — ABNORMAL LOW (ref 8.9–10.3)
Chloride: 97 mmol/L — ABNORMAL LOW (ref 98–111)
Creatinine, Ser: 0.9 mg/dL (ref 0.61–1.24)
GFR, Estimated: >60 mL/min (ref >60)
Glucose, Bld: 93 mg/dL (ref 70–99)
Potassium: 4.4 mmol/L (ref 3.5–5.1)
Sodium: 137 mmol/L (ref 135–145)
Total Bilirubin: 1.5 mg/dL — ABNORMAL HIGH (ref 0.3–1.2)
Total Protein: 6.1 g/dL — ABNORMAL LOW (ref 6.5–8.1)

## 2021-09-12 NOTE — Progress Notes (Signed)
Not really eating well, trying baby food at times. Nothing staying down.  Phone message from yesterday having low O2.  Fingers and toes stay cold.

## 2021-09-12 NOTE — Progress Notes (Signed)
[place] NOTE  Patient Care Team: Baxter Hire, MD as PCP - General (Internal Medicine) Cammie Sickle, MD as Consulting Physician (Oncology) Borders, Kirt Boys, NP as Nurse Practitioner (Hospice and Palliative Medicine)  CHIEF COMPLAINTS/PURPOSE OF CONSULTATION: Gastroesophageal cancer     Oncology History Overview Note    FEB 2023- [place] [in pt EGD]- GE Junction mass-large fungating circumferential; nonbleeding- . GE JUNCTION MASS, BIOPSY:  - Invasive moderate to poorly differentiated adenocarcinoma, arising in  a background of focal intestinal metaplasia with high grade dysplasia   # JAN 2023 ER - [BACK PAIN- ]Abdominal Lymphadenopathy --gastro esophagus/pancreas/portocaval space/intra-aortocaval and periaortic and right iliac chain-suspicious for necrosis based on imaging.  Highly concerning for malignancy.    # Nonischemic cardiomyopathy /status post defibrillator/-polymorphic ventricular tachycardia ablation-amiodarone [Dr.Klein]; CHF/ Chronic hypotension: 25Z to 56L systolic/diastolic 87F to 64P [chronic]-NO  diuretics.  Aspirin plus Plavix.   Hypothyroidism- on synthroid    Gastroesophageal cancer ([place])  09/02/2021 Initial Diagnosis   Gastroesophageal cancer ([place])   09/12/2021 Cancer Staging   Staging form: Esophagus - Adenocarcinoma, AJCC 8th Edition - Clinical: Stage IVB (cT3, cN3, cM1) - Signed by Cammie Sickle, MD on 09/12/2021       HISTORY OF PRESENTING ILLNESS: Patient is in a wheelchair.  Accompanied by his wife.  Jerry Farley 76 y.o.  male with multiple comorbidities including severe nonischemic cardiomyopathy-and newly diagnosed abdominal lymphadenopathy is here for follow-up/review results of the PET scan  Patient denies any nausea vomiting.  Continues to have difficulty swallowing.  Intermittent drop in O2 saturations.  However today his saturations are 98% in the office on room air.  Overall he feels  poorly.  Review of Systems  Constitutional:  Positive for malaise/fatigue and weight loss. Negative for chills, diaphoresis and fever.  HENT:  Negative for nosebleeds and sore throat.   Eyes:  Negative for double vision.  Respiratory:  Negative for cough, hemoptysis, sputum production, shortness of breath and wheezing.   Cardiovascular:  Negative for chest pain, palpitations, orthopnea and leg swelling.  Gastrointestinal:  Positive for nausea. Negative for abdominal pain, blood in stool, constipation, diarrhea, heartburn, melena and vomiting.  Genitourinary:  Negative for dysuria, frequency and urgency.  Musculoskeletal:  Negative for back pain and joint pain.  Skin: Negative.  Negative for itching and rash.  Neurological:  Negative for dizziness, tingling, focal weakness, weakness and headaches.  Endo/Heme/Allergies:  Does not bruise/bleed easily.  Psychiatric/Behavioral:  Negative for depression. The patient is not nervous/anxious and does not have insomnia.     MEDICAL HISTORY:  Past Medical History:  Diagnosis Date   6948-lead    Arthritis    "maybe a little"   CAD (coronary artery disease) 01/2006   CFX stent (patent Sept '07)   Cardiac defibrillator CRT-medtronic 32/95/1884   Chronic systolic congestive heart failure (HCC)    Complication of anesthesia    "problems waking up w/valium"   Gastroesophageal cancer ([place])    High cholesterol    Hypertension    Hypothyroidism    ICD (implantable cardiac defibrillator) in place 2006, 2013   Medtronic Protecta XT-DR CRT (01/2012)   NICM (nonischemic cardiomyopathy) ([place])    ejection fraction 40-45% by echo July 2012   Non-ischemic cardiomyopathy- EF 40-45% 2D 7/14 02/11/2012   Obesity (BMI 30-39.9) 08/22/2013   Ventricular tachycardia, non-sustained 2007   Amiodarone    SURGICAL HISTORY: Past Surgical History:  Procedure Laterality Date   BI-VENTRICULAR IMPLANTABLE CARDIOVERTER DEFIBRILLATOR UPGRADE N/A  02/10/2012   Procedure: BI-VENTRICULAR IMPLANTABLE CARDIOVERTER DEFIBRILLATOR UPGRADE;  Surgeon: Deboraha Sprang, MD;  Location: [facility] CATH LAB;  Service: Cardiovascular;  Laterality: N/A;   BIOPSY  08/28/2021   Procedure: BIOPSY;  Surgeon: Sharyn Creamer, MD;  Location: [facility] ENDOSCOPY;  Service: Gastroenterology;;   BIV ICD GENERATOR CHANGEOUT N/A 06/24/2018   Procedure: BIV ICD GENERATOR CHANGEOUT;  Surgeon: Deboraha Sprang, MD;  Location: [facility] CV LAB;  Service: Cardiovascular;  Laterality: N/A;   CARDIAC CATHETERIZATION  11/30/2001   non-ischemic cardiomyopahty with high LV end-diastolic pressure; 29% distal L main stenosis, large osital stenosis 70%, 70% Cfx stenosis (Dr. Domenic Moras)   CARDIAC CATHETERIZATION  9/07   CFX stent patent [facility])   CARDIAC DEFIBRILLATOR PLACEMENT  09/2004; 02/10/12   ICD implanted in 2006 (Dr. Rito Ehrlich); EOL generator change in 01/2012 - CRT (BiV ICD) (Dr. Caryl Comes)   CORONARY ANGIOPLASTY WITH STENT PLACEMENT   01/2006   CFX    ESOPHAGOGASTRODUODENOSCOPY (EGD) WITH PROPOFOL N/A 08/28/2021   Procedure: ESOPHAGOGASTRODUODENOSCOPY (EGD) WITH PROPOFOL;  Surgeon: Sharyn Creamer, MD;  Location: [place];  Service: Gastroenterology;  Laterality: N/A;   FOREIGN BODY REMOVAL  08/28/2021   Procedure: FOREIGN BODY REMOVAL;  Surgeon: Sharyn Creamer, MD;  Location: [facility] ENDOSCOPY;  Service: Gastroenterology;;   LEFT HEART CATH AND CORONARY ANGIOGRAPHY N/A 04/13/2019   Procedure: LEFT HEART CATH AND CORONARY ANGIOGRAPHY;  Surgeon: Jettie Booze, MD;  Location: [place] CV LAB;  Service: Cardiovascular;  Laterality: N/A;   NM MYOCAR PERF EJECTION FRACTION  05/2010   dipyridamole myoview; mod perfusion defect in apical, basal inferior, mid inferior & apical inferior regions (infarct/scar); post-stress 28%, low risk scan   TONSILLECTOMY AND ADENOIDECTOMY     "when I was a child"   TRANSTHORACIC ECHOCARDIOGRAM  01/2013   EF 40-45%, LV mildly dilated, mild LVH, mod  hypokinesis of inferior myocardium, grade 1 diastolic dysfunction; mild MR   V TACH ABLATION N/A 02/13/2021   Procedure: V TACH ABLATION;  Surgeon: Evans Lance, MD;  Location: [place] CV LAB;  Service: Cardiovascular;  Laterality: N/A;    SOCIAL HISTORY: Social History   Socioeconomic History   Marital status: Married    Spouse name: Not on file   Number of children: Not on file   Years of education: Not on file   Highest education level: Not on file  Occupational History   Occupation: retired -- Armed forces operational officer  Tobacco Use   Smoking status: Former    Packs/day: 1.50    Years: 42.00    Pack years: 63.00    Types: Cigarettes    Quit date: 07/28/2003    Years since quitting: 18.1   Smokeless tobacco: Former    Types: Nurse, children's Use: Never used  Substance and Sexual Activity   Alcohol use: No   Drug use: No   Sexual activity: Not Currently  Other Topics Concern   Not on file  Social History Narrative   Not on file   Social Determinants of Health   Financial Resource Strain: Not on file  Food Insecurity: Not on file  Transportation Needs: Not on file  Physical Activity: Not on file  Stress: Not on file  Social Connections: Not on file  Intimate Partner Violence: Not on file    FAMILY HISTORY: Family History  Problem Relation Age of Onset   Cancer Mother    Cancer Brother     ALLERGIES:  is allergic  to contrast media [iodinated contrast media].  MEDICATIONS:  Current Outpatient Medications  Medication Sig Dispense Refill   acetaminophen (TYLENOL) 325 MG tablet Take 2 tablets (650 mg total) by mouth every 4 (four) hours as needed for headache or mild pain.     amiodarone (PACERONE) 200 MG tablet Take 1 tablet (200 mg total) by mouth daily. 90 tablet 3   clopidogrel (PLAVIX) 75 MG tablet Take 1 tablet by mouth once daily 90 tablet 3   EUTHYROX 125 MCG tablet TAKE 1 TABLET BY MOUTH ONCE DAILY BEFORE BREAKFAST (Patient taking  differently: Take 125 mcg by mouth daily before breakfast.) 90 tablet 1   Magnesium Oxide 400 MG CAPS Take 400 mg by mouth daily.     midodrine (PROAMATINE) 10 MG tablet Take 1 tablet (10 mg total) by mouth 3 (three) times daily with meals. 90 tablet 0   Multiple Vitamin (MULTIVITAMIN) tablet Take 1 tablet by mouth daily.     ondansetron (ZOFRAN) 4 MG tablet Take 1 tablet (4 mg total) by mouth every 8 (eight) hours as needed for nausea or vomiting. 20 tablet 0   oxymetazoline (AFRIN) 0.05 % nasal spray Place 1 spray into both nostrils 2 (two) times daily as needed for congestion.     pantoprazole (PROTONIX) 40 MG tablet Take 1 tablet (40 mg total) by mouth daily. 30 tablet 0   rosuvastatin (CRESTOR) 20 MG tablet Take 1 tablet by mouth once daily 90 tablet 3   zolpidem (AMBIEN) 5 MG tablet Take by mouth.     fish oil-omega-3 fatty acids 1000 MG capsule Take 1 g by mouth daily. (Patient not taking: Reported on 09/12/2021)     No current facility-administered medications for this visit.    PHYSICAL EXAMINATION: ECOG PERFORMANCE STATUS: 2 - Symptomatic, <50% confined to bed  Vitals:   09/12/21 1103  BP: (!) 89/41  Pulse: 89  Temp: (!) 97.4 F (36.3 C)  SpO2: 99%   Filed Weights   09/12/21 1103  Weight: 162 lb 14.4 oz (73.9 kg)   Frail-appearing male patient. Physical Exam Vitals and nursing note reviewed.  HENT:     Head: Normocephalic and atraumatic.     Mouth/Throat:     Pharynx: Oropharynx is clear.  Eyes:     Extraocular Movements: Extraocular movements intact.     Pupils: Pupils are equal, round, and reactive to light.  Cardiovascular:     Rate and Rhythm: Normal rate and regular rhythm.  Pulmonary:     Comments: Decreased breath sounds bilaterally.  Abdominal:     Palpations: Abdomen is soft.  Musculoskeletal:        General: Normal range of motion.     Cervical back: Normal range of motion.  Skin:    General: Skin is warm.  Neurological:     General: No  focal deficit present.     Mental Status: He is alert and oriented to person, place, and time.  Psychiatric:        Behavior: Behavior normal.        Judgment: Judgment normal.     LABORATORY DATA:  I have reviewed the data as listed Lab Results  Component Value Date   WBC 8.6 09/12/2021   HGB 13.1 09/12/2021   HCT 39.2 09/12/2021   MCV 96.1 09/12/2021   PLT 210 09/12/2021   Recent Labs    09/25/20 1141 12/13/20 1309 08/25/21 0350 08/26/21 0656 08/30/21 0048 08/31/21 1022 09/02/21 1254 09/12/21 1044  NA  --    < >  137   < > 138  --  136 137  K  --    < > 3.8   < > 5.0 4.4 4.5 4.4  CL  --    < > 106   < > 101  --  97* 97*  CO2  --    < > 22   < > 30  --  31 27  GLUCOSE  --    < > 78   < > 98  --  123* 93  BUN  --    < > 7*   < > 7*  --  9 14  CREATININE  --    < > 0.85   < > 0.87  --  0.71 0.90  CALCIUM  --    < > 8.0*   < > 8.7*  --  8.9 8.7*  GFRNONAA  --    < > >60   < > >60  --  >60 >60  PROT 6.5   < > 4.8*  --   --   --  6.1* 6.1*  ALBUMIN 4.2   < > 2.5*  --   --   --  3.2* 3.1*  AST 70*   < > 44*  --   --   --  63* 152*  ALT 67*   < > 58*  --   --   --  72* 108*  ALKPHOS 61   < > 44  --   --   --  83 333*  BILITOT 0.6   < > 0.7  --   --   --  0.6 1.5*  BILIDIR 0.17  --   --   --   --   --   --   --    < > = values in this interval not displayed.    RADIOGRAPHIC STUDIES: I have personally reviewed the radiological images as listed and agreed with the findings in the report. CT CHEST ABDOMEN PELVIS W CONTRAST  Result Date: 08/15/2021 CLINICAL DATA:  Unintended weight loss with nausea, initial encounter EXAM: CT CHEST, ABDOMEN, AND PELVIS WITH CONTRAST TECHNIQUE: Multidetector CT imaging of the chest, abdomen and pelvis was performed following the standard protocol during bolus administration of intravenous contrast. RADIATION DOSE REDUCTION: This exam was performed according to the departmental dose-optimization program which includes automated exposure control,  adjustment of the mA and/or kV according to patient size and/or use of iterative reconstruction technique. CONTRAST:  19mL OMNIPAQUE IOHEXOL 300 MG/ML SOLN. Patient was pre-medicated with 4 hour emergency premedication COMPARISON:  None. FINDINGS: CT CHEST FINDINGS Cardiovascular: Thoracic aorta demonstrates atherosclerotic calcifications without aneurysmal dilatation. Coronary calcifications are seen. No cardiac enlargement is noted. Pulmonary artery as visualized is within normal limits. Defibrillator device is noted. Mediastinum/Nodes: Thoracic inlet is within normal limits. There is a 12 mm short axis lymph node identified in the right paratracheal region. No other sizable hilar or mediastinal adenopathy is noted. The esophagus appears within normal limits. Lungs/Pleura: Lungs are well aerated bilaterally and demonstrate emphysematous change. No focal confluent infiltrate or sizable effusion is seen. Mild tree-in-bud changes are noted throughout both lungs likely related to atypical pneumonia. Musculoskeletal: Degenerative changes of the thoracic spine are noted. No acute rib abnormality is seen. CT ABDOMEN PELVIS FINDINGS Hepatobiliary: Liver demonstrates some scattered calcifications likely related to granulomas. Mild periportal edema is noted which may be related volume overload or hepatic inflammatory change. Gallbladder is well distended with dependent gallstones. No wall  thickening or pericholecystic fluid is noted. Pancreas: Unremarkable. No pancreatic ductal dilatation or surrounding inflammatory changes. Spleen: Normal in size without focal abnormality. Adrenals/Urinary Tract: Adrenal glands are within normal limits. Kidneys demonstrate a normal enhancement pattern bilaterally. Small cyst is noted in the lower pole of right kidney. No calculi or obstructive changes are seen. The bladder is within normal limits. Stomach/Bowel: Diverticular change of the colon is noted. No evidence of diverticulitis is  seen. No obstructive or inflammatory changes are noted. The appendix is not well visualized. No inflammatory changes to suggest appendicitis are noted. The stomach small bowel appear within normal limits. Vascular/Lymphatic: Atherosclerotic calcifications are identified. Scattered Peri aortic lymph nodes are noted with central decreased attenuation consistent with necrosis. The largest of these measures 10 mm in short axis. Adjacent to the pancreatic head and extending superiorly towards the gastroesophageal junction there are multiple areas of fluid attenuation with peripheral enhancement likely related to centrally necrotic lymph nodes. Adjacent to the pancreatic head and portacaval space these measure 16 mm in short axis. The total size of the area surrounding the gastroesophageal junction measures 4.8 x 2.5 cm. Additionally some right iliac chain nodes are noted measuring up to 9 mm. Reproductive: Prostate is unremarkable. Other: No abdominal wall hernia or abnormality. No abdominopelvic ascites. Musculoskeletal: No acute or significant osseous findings. IMPRESSION: Multiple lymph nodes are noted surrounding the gastroesophageal junction, extending towards the pancreatic head, involving the portacaval space and pre portal region as well as the intra-aortocaval and periaortic and right iliac chain. These demonstrate central decreased attenuation suspicious for necrosis. A definitive primary neoplasm is not identified on this exam. PET-CT and tissue sampling would be helpful. Diverticulosis without diverticulitis. Cholelithiasis. Tree-in-bud changes within the lungs bilaterally likely related to atypical pneumonia. Electronically Signed   By: Inez Catalina M.D.   On: 08/15/2021 03:01   NM PET Image Initial (PI) Skull Base To Thigh (F-18 FDG)  Result Date: 09/03/2021 CLINICAL DATA:  Initial treatment strategy for esophageal cancer in a 76 year old male. EXAM: NUCLEAR MEDICINE PET SKULL BASE TO THIGH TECHNIQUE:  10.16 mCi F-18 FDG was injected intravenously. Full-ring PET imaging was performed from the skull base to thigh after the radiotracer. CT data was obtained and used for attenuation correction and anatomic localization. Fasting blood glucose: 98 mg/dl COMPARISON:  August 15, 2020. FINDINGS: Mediastinal blood pool activity: SUV max 2.16 Liver activity: SUV max NA NECK: No hypermetabolic lymph nodes in the neck. Incidental CT findings: none CHEST: Multiple areas of increased metabolic activity involving lymph nodes in the chest. (Image 52/3) LEFT supraclavicular lymph node 9 mm with a maximum SUV of 4.9. Similar increased metabolic activity associated with smaller lymph nodes at the RIGHT thoracic inlet. (Image 59/3) 9 mm lymph node along the high RIGHT paratracheal chain with a maximum SUV of 3.0. (Image 73/3) subcentimeter RIGHT paratracheal nodal tissue with a maximum SUV of 4.9. Smaller lymph nodes track along the RIGHT paratracheal chain cephalad with slightly less metabolic activity still above mediastinal blood pool. Anterior mediastinal and LEFT hilar nodal tissue also with increased metabolic activity. LEFT axillary lymph nodes (image 68/3) maximum SUV of 4.3 measuring 8 mm. Marked distal esophageal and gastroesophageal uptake at the GE junction with maximum SUV of 11.2 (image 119/3) this measures approximately 2.9 x 2.2 cm. Diffuse nodularity throughout the chest with indistinct areas of nodularity and some mixed tree-in-bud opacities which have worsened since previous imaging. Greatest area 2.1 x 1.6 cm is not substantially hypermetabolic nor are other areas  including enlarging nodule in the LEFT lower lobe (image 90/3) 9 mm with a maximum SUV of 1.8. Incidental CT findings: Calcified atheromatous plaque of the thoracic aorta. Three-vessel coronary artery disease. Normal heart size. Cardiac pacer defibrillator remains in place as before. Central pulmonary vasculature is of normal caliber. Limited assessment  of cardiovascular structures given lack of intravenous contrast. ABDOMEN/PELVIS: Gastroesophageal FDG uptake. Diffuse uptake tracking from the GE junction to the celiac origin following soft tissue that was seen on the previous study. Increased metabolic activity surrounds the celiac axis and measures approximately 3.7 x 1.8 cm (image 133/3) be hind the pancreas about the celiac origin, maximum SUV in this area up to 6.6. RIGHT adrenal uptake (image 135/3) maximum SUV 6.3. Increased density of the liver compatible with amiodarone deposition without visible lesion. Signs of mesenteric adenopathy and retroperitoneal adenopathy tracking into the pelvis. Lymph node at the root of the small bowel mesentery (image 149/3) small size in terms of short axis dimension at 8 mm with a maximum SUV of 7.6. Indistinct nodal tissue along the LEFT periaortic chain (image 155/3) 9 mm with a maximum SUV of 5.6, slightly greater metabolic activity in other lymph nodes tracking along the LEFT periaortic chain. Also tracking along the RIGHT and LEFT common iliac region with a discrete lymph node at the level of the RIGHT common iliac lateral chain (image 188/3) 12 mm lymph node with a maximum SUV of 9.9. Incidental CT findings: No pericholecystic stranding. Signs of pancreatic atrophy. Normal spleen. Normal LEFT adrenal. No hydronephrosis. No acute gastrointestinal process with infiltration of the small bowel mesentery centrally. Small volume fluid in the pelvis slightly increased from recent imaging. Aortic atherosclerosis without aneurysm. SKELETON: Increased metabolic activity about degenerative areas in the spine in the cervical spine though with asymmetry showing a maximum SUV on image 31/3 involving cervical spine (adjacent 2 C3) musculature and adjacent spine with degenerative changes of 5.7. Asymmetry of soft tissues in this area could be due to muscular differences. Similarly increased metabolic activity on image 45/3 without  visible lesion and with associated degenerative changes. This area is in the area of C5-6 and C6-7 greater on the RIGHT than the LEFT Areas of increased metabolic activity in the LEFT posterior acetabular roof (image 216/3) subtle sclerosis in this area approximately 1 cm with a maximum SUV of 7.7 Increased metabolic activity seen adjacent to lumbar spine (L2 level) on image 154/3 favored to be related to soft tissue changes and nodal disease, no discrete lesion in the spine. Soft tissue nodule in the anterior abdominal wall (image 138/3) maximum SUV of 4.0 measuring 8 mm. RIGHT proximal humeral uptake (image 49/3) subtle heterogeneity in the marrow space of the RIGHT proximal humerus with a maximum SUV of 6.7, area of uptake measuring up to 2.2 cm Incidental CT findings: none IMPRESSION: Signs of gastroesophageal neoplasm with diffuse nodal metastasis in the abdomen involving the retroperitoneum and small bowel mesentery. Nodal disease also in the chest and pelvis. Signs of bony metastatic disease to the RIGHT proximal humerus and LEFT acetabulum with other areas which are equivocal in the lumbar spine and in the cervical spine either bony or soft tissue involvement adjacent to the spine. Muscular uptake is felt less likely in the cervical spine given focal appearance. Abdominal wall nodules suspicious for metastasis. Slight increase in pelvic ascites. Diffuse nodular opacities in the chest with interstitial prominence could be due in part to a metastatic process though the rapid change seen on the current  study raising the question of diffuse, multifocal infection and or aspiration related changes. Signs of generalized atherosclerosis, coronary artery disease and presumed amiodarone deposition in the liver. Aortic Atherosclerosis (ICD10-I70.0). Electronically Signed   By: Zetta Bills M.D.   On: 09/03/2021 14:08   DG CHEST PORT 1 VIEW  Result Date: 08/26/2021 CLINICAL DATA:  Dyspnea, nausea EXAM: PORTABLE  CHEST 1 VIEW COMPARISON:  CT chest dated August 15, 2021 FINDINGS: The heart size and mediastinal contours are within normal limits. Atherosclerotic calcification of aortic arch. Pacemaker leads terminating in the right atrium, right ventricle and coronary sinus. Low lung volumes. Bilateral hazy lung opacities, which may be secondary to prominence of the vessels due to low lung volumes are infectious/inflammatory process. No focal consolidation or pleural effusion. The visualized skeletal structures are unremarkable. IMPRESSION: Low lung volumes with hazy lung opacities bilaterally, which may represent prominent vessels due to low lung volumes. Differential includes infectious/inflammatory process. No focal consolidation or large pleural effusion. Electronically Signed   By: Keane Police D.O.   On: 08/26/2021 08:08    ASSESSMENT & PLAN:   Gastroesophageal cancer ([place]) #Gastroesophageal adenocarcinoma -with clinically metastatic abdominal Lymphadenopathy--gastro esophagus/pancreas/portocaval space/intra-aortocaval and periaortic and right iliac chain-suspicious for necrosis based on imaging.  PET scan 2/08- Signs of gastroesophageal neoplasm with diffuse nodal metastasis in the abdomen involving the retroperitoneum and small bowel mesentery. Nodal disease also in the chest and pelvis. Signs of bony metastatic disease to the RIGHT proximal humerus and LEFT acetabulum with other areas which are equivocal in the lumbar spine and in the cervical spine either bony or soft tissue involvement adjacent to the spine. Abdominal wall nodules suspicious for metastasis. Slight increase in pelvic ascites.  #I had a long discussion with patient and wife regarding the concerning results of the PET scan suggestive of stage IV gastroesophageal cancer.  Discussed the option of chemotherapy radiation versus chemotherapy alone.  However, I am very concerned about patient's tolerance to therapy given his significant  cardiomyopathy/chronic hypotension etc. patient inquired about radiation alone which again is a reasonable option.  However, he understands that radiation could still entail side effects-including fatigue/difficulty swallowing-and again palliative option only.  I would recommend hospice/supportive care.  See discussion regarding prognosis.  Patient will call us with regards to his decision  # I had a long discussion again-my significant concerns for potential side effects of either chemotherapy or radiation given his poor performance status/chronic hypotension blood pressures 94W systolic/and significant cardiomyopathy.  Discussed in general the life expectancy of metastatic esophagus cancer is approximately 12 months with treatments.  #Nonischemic cardiomyopathy /status post defibrillator/ablation.  Hypotension: 96P to 59F systolic/diastolic 63W to 46K [chronic]-  Continue with Plavix/amiodarone.   Prognosis/natural history : discussed that is a difficult cancer to treat; and at best with the most aggressive chemotherapy with best performance status [which the patient is not a candidate] the median survival is about 1 year.  Given patient's multiple comorbidities/frailty-inability to tolerate chemotherapy-his life expectancy is far less than median.  Recommend evaluation with palliative care.  Reluctantly agreed to meet with [facility].  I discussed with Merrily Pew, signed a DNR/DNI paperwork.  # DISPOSITION: # follow up TBD- Dr.B  # I reviewed the blood work- with the patient in detail; also reviewed the imaging independently [as summarized above]; and with the patient in detail.   # 40 minutes face-to-face with the patient discussing the above plan of care; more than 50% of time spent on prognosis/ natural history; counseling and coordination.  Cc; Dr.Johnston/Dr.Kline  All questions were answered. The patient knows to call the clinic with any problems, questions or concerns.    Cammie Sickle, MD 09/12/2021 5:26 PM

## 2021-09-12 NOTE — Telephone Encounter (Signed)
Pt being seen in office today.

## 2021-09-12 NOTE — Assessment & Plan Note (Addendum)
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  PET scan 2/08- Signs of gastroesophageal neoplasm with diffuse nodal metastasis in the abdomen involving the retroperitoneum and small bowel mesentery. Nodal disease also in the chest and pelvis. Signs of bony metastatic disease to the RIGHT proximal humerus and LEFT acetabulum with other areas which are equivocal in the lumbar spine and in the cervical spine either bony or soft tissue involvement adjacent to the spine. Abdominal wall nodules suspicious for metastasis. Slight increase in pelvic ascites.  #I had a long discussion with patient and wife regarding the concerning results of the PET scan suggestive of stage IV gastroesophageal cancer.  Discussed the option of chemotherapy radiation versus chemotherapy alone.  However, I am very concerned about patient's tolerance to therapy given his significant cardiomyopathy/chronic hypotension etc. patient inquired about radiation alone which again is a reasonable option.  However, he understands that radiation could still entail side effects-including fatigue/difficulty swallowing-and again palliative option only.  I would recommend hospice/supportive care.  See discussion regarding prognosis.  Patient will call us with regards to his decision  # I had a long discussion again-my significant concerns for potential side effects of either chemotherapy or radiation given his poor performance status/chronic hypotension blood pressures 52D systolic/and significant cardiomyopathy.  Discussed in general the life expectancy of metastatic esophagus cancer is approximately 12 months with treatments.  #Nonischemic cardiomyopathy /status post defibrillator/ablation.  Hypotension: 78E to 42P systolic/diastolic 53I to 14E [chronic]-  Continue with  Plavix/amiodarone.   Prognosis/natural history : discussed that is a difficult cancer to treat; and at best with the most aggressive chemotherapy with best performance status [which the patient is not a candidate] the median survival is about 1 year.  Given patient's multiple comorbidities/frailty-inability to tolerate chemotherapy-his life expectancy is far less than median.  Recommend evaluation with palliative care.  Reluctantly agreed to meet with [facility].  I discussed with Merrily Pew, signed a DNR/DNI paperwork.  # DISPOSITION: # follow up TBD- Dr.B  # I reviewed the blood work- with the patient in detail; also reviewed the imaging independently [as summarized above]; and with the patient in detail.   # 40 minutes face-to-face with the patient discussing the above plan of care; more than 50% of time spent on prognosis/ natural history; counseling and coordination.   Cc; Dr.Johnston/Dr.Kline

## 2021-09-12 NOTE — Progress Notes (Signed)
[place] at [facility] [phone] [phone]   Name: Jerry Farley Date: 09/12/2021 MRN: [id number]  DOB: June 21, 1946  Patient Care Team: Baxter Hire, MD as PCP - General (Internal Medicine) Cammie Sickle, MD as Consulting Physician (Oncology) Darlene Brozowski, Kirt Boys, NP as Nurse Practitioner (Hospice and Palliative Medicine)    REASON FOR CONSULTATION: Jerry Farley is a 76 y.o. male with multiple medical problems including severe NICM status post AICD, VT status post ablation, CHF, chronic hypertension chronic hypothyroidism now with newly diagnosed stage IV esophageal cancer with nodal/bone/abdominal wall metastases.  Patient is not felt to be a viable candidate for systemic chemotherapy.  Hospice is being recommended.  He is referred to palliative care to discuss goals.  SOCIAL HISTORY:     reports that he quit smoking about 18 years ago. His smoking use included cigarettes. He has a 63.00 pack-year smoking history. He has quit using smokeless tobacco.  His smokeless tobacco use included chew. He reports that he does not drink alcohol and does not use drugs.  Patient is married lives at home with his wife and son.  ADVANCE DIRECTIVES:  None on file  CODE STATUS: DNR/DNI (DNR form signed on 09/12/2021)  PAST MEDICAL HISTORY: Past Medical History:  Diagnosis Date   6948-lead    Arthritis    "maybe a little"   CAD (coronary artery disease) 01/2006   CFX stent (patent Sept '07)   Cardiac defibrillator CRT-medtronic 01/24/1600   Chronic systolic congestive heart failure (HCC)    Complication of anesthesia    "problems waking up w/valium"   High cholesterol    Hypertension    Hypothyroidism    ICD (implantable cardiac defibrillator) in place 2006, 2013   Medtronic Protecta XT-DR CRT (01/2012)   NICM (nonischemic cardiomyopathy) ([place])    ejection fraction 40-45% by echo July 2012   Non-ischemic  cardiomyopathy- EF 40-45% 2D 7/14 02/11/2012   Obesity (BMI 30-39.9) 08/22/2013   Ventricular tachycardia, non-sustained 2007   Amiodarone    PAST SURGICAL HISTORY:  Past Surgical History:  Procedure Laterality Date   BI-VENTRICULAR IMPLANTABLE CARDIOVERTER DEFIBRILLATOR UPGRADE N/A 02/10/2012   Procedure: BI-VENTRICULAR IMPLANTABLE CARDIOVERTER DEFIBRILLATOR UPGRADE;  Surgeon: Deboraha Sprang, MD;  Location: [facility] CATH LAB;  Service: Cardiovascular;  Laterality: N/A;   BIOPSY  08/28/2021   Procedure: BIOPSY;  Surgeon: Sharyn Creamer, MD;  Location: [facility] ENDOSCOPY;  Service: Gastroenterology;;   BIV ICD GENERATOR CHANGEOUT N/A 06/24/2018   Procedure: BIV ICD GENERATOR CHANGEOUT;  Surgeon: Deboraha Sprang, MD;  Location: [place] CV LAB;  Service: Cardiovascular;  Laterality: N/A;   CARDIAC CATHETERIZATION  11/30/2001   non-ischemic cardiomyopahty with high LV end-diastolic pressure; 09% distal L main stenosis, large osital stenosis 70%, 70% Cfx stenosis (Dr. Domenic Moras)   CARDIAC CATHETERIZATION  9/07   CFX stent patent [facility])   CARDIAC DEFIBRILLATOR PLACEMENT  09/2004; 02/10/12   ICD implanted in 2006 (Dr. Rito Ehrlich); EOL generator change in 01/2012 - CRT (BiV ICD) (Dr. Caryl Comes)   CORONARY ANGIOPLASTY WITH STENT PLACEMENT   01/2006   CFX    ESOPHAGOGASTRODUODENOSCOPY (EGD) WITH PROPOFOL N/A 08/28/2021   Procedure: ESOPHAGOGASTRODUODENOSCOPY (EGD) WITH PROPOFOL;  Surgeon: Sharyn Creamer, MD;  Location: [place];  Service: Gastroenterology;  Laterality: N/A;   FOREIGN BODY REMOVAL  08/28/2021   Procedure: FOREIGN BODY REMOVAL;  Surgeon: Sharyn Creamer, MD;  Location: [place];  Service: Gastroenterology;;   LEFT HEART CATH  AND CORONARY ANGIOGRAPHY N/A 04/13/2019   Procedure: LEFT HEART CATH AND CORONARY ANGIOGRAPHY;  Surgeon: Jettie Booze, MD;  Location: [place] CV LAB;  Service: Cardiovascular;  Laterality: N/A;   NM MYOCAR PERF EJECTION FRACTION  05/2010   dipyridamole myoview; mod  perfusion defect in apical, basal inferior, mid inferior & apical inferior regions (infarct/scar); post-stress 28%, low risk scan   TONSILLECTOMY AND ADENOIDECTOMY     "when I was a child"   TRANSTHORACIC ECHOCARDIOGRAM  01/2013   EF 40-45%, LV mildly dilated, mild LVH, mod hypokinesis of inferior myocardium, grade 1 diastolic dysfunction; mild MR   V TACH ABLATION N/A 02/13/2021   Procedure: V TACH ABLATION;  Surgeon: Evans Lance, MD;  Location: [place] CV LAB;  Service: Cardiovascular;  Laterality: N/A;    HEMATOLOGY/ONCOLOGY HISTORY:  Oncology History Overview Note    FEB 2023- [place] [in pt EGD]- GE Junction mass-large fungating circumferential; nonbleeding- . GE JUNCTION MASS, BIOPSY:  - Invasive moderate to poorly differentiated adenocarcinoma, arising in  a background of focal intestinal metaplasia with high grade dysplasia   # JAN 2023 ER - [BACK PAIN- ]Abdominal Lymphadenopathy --gastro esophagus/pancreas/portocaval space/intra-aortocaval and periaortic and right iliac chain-suspicious for necrosis based on imaging.  Highly concerning for malignancy.    # Nonischemic cardiomyopathy /status post defibrillator/-polymorphic ventricular tachycardia ablation-amiodarone [Dr.Klein]; CHF/ Chronic hypotension: 16X to 09U systolic/diastolic 04V to 40J [chronic]-NO  diuretics.  Aspirin plus Plavix.   Hypothyroidism- on synthroid    Gastroesophageal cancer ([place])  09/02/2021 Initial Diagnosis   Gastroesophageal cancer (HCC)     ALLERGIES:  is allergic to contrast media [iodinated contrast media].  MEDICATIONS:  Current Outpatient Medications  Medication Sig Dispense Refill   acetaminophen (TYLENOL) 325 MG tablet Take 2 tablets (650 mg total) by mouth every 4 (four) hours as needed for headache or mild pain.     amiodarone (PACERONE) 200 MG tablet Take 1 tablet (200 mg total) by mouth daily. 90 tablet 3   clopidogrel (PLAVIX) 75 MG tablet Take 1 tablet by mouth once daily 90  tablet 3   EUTHYROX 125 MCG tablet TAKE 1 TABLET BY MOUTH ONCE DAILY BEFORE BREAKFAST (Patient taking differently: Take 125 mcg by mouth daily before breakfast.) 90 tablet 1   fish oil-omega-3 fatty acids 1000 MG capsule Take 1 g by mouth daily. (Patient not taking: Reported on 09/12/2021)     Magnesium Oxide 400 MG CAPS Take 400 mg by mouth daily.     midodrine (PROAMATINE) 10 MG tablet Take 1 tablet (10 mg total) by mouth 3 (three) times daily with meals. 90 tablet 0   Multiple Vitamin (MULTIVITAMIN) tablet Take 1 tablet by mouth daily.     ondansetron (ZOFRAN) 4 MG tablet Take 1 tablet (4 mg total) by mouth every 8 (eight) hours as needed for nausea or vomiting. 20 tablet 0   oxymetazoline (AFRIN) 0.05 % nasal spray Place 1 spray into both nostrils 2 (two) times daily as needed for congestion.     pantoprazole (PROTONIX) 40 MG tablet Take 1 tablet (40 mg total) by mouth daily. 30 tablet 0   rosuvastatin (CRESTOR) 20 MG tablet Take 1 tablet by mouth once daily 90 tablet 3   zolpidem (AMBIEN) 5 MG tablet Take by mouth.     No current facility-administered medications for this visit.    VITAL SIGNS: There were no vitals taken for this visit. There were no vitals filed for this visit.  Estimated body mass index is 25.51  kg/m as calculated from the following:   Height as of an earlier encounter on 09/12/21: 5' 7"  (1.702 m).   Weight as of an earlier encounter on 09/12/21: 162 lb 14.4 oz (73.9 kg).  LABS: CBC:    Component Value Date/Time   WBC 8.6 09/12/2021 1044   HGB 13.1 09/12/2021 1044   HGB 13.5 07/24/2021 0937   HCT 39.2 09/12/2021 1044   HCT 39.2 07/24/2021 0937   PLT 210 09/12/2021 1044   PLT 195 07/24/2021 0937   MCV 96.1 09/12/2021 1044   MCV 96 07/24/2021 0937   NEUTROABS 6.9 09/12/2021 1044   NEUTROABS 4.3 01/19/2018 1011   LYMPHSABS 0.9 09/12/2021 1044   LYMPHSABS 1.8 01/19/2018 1011   MONOABS 0.7 09/12/2021 1044   EOSABS 0.2 09/12/2021 1044   EOSABS 0.2 01/19/2018  1011   BASOSABS 0.0 09/12/2021 1044   BASOSABS 0.0 01/19/2018 1011   Comprehensive Metabolic Panel:    Component Value Date/Time   NA 137 09/12/2021 1044   NA 139 07/24/2021 0937   K 4.4 09/12/2021 1044   CL 97 (L) 09/12/2021 1044   CO2 27 09/12/2021 1044   BUN 14 09/12/2021 1044   BUN 11 07/24/2021 0937   CREATININE 0.90 09/12/2021 1044   CREATININE 1.14 07/01/2016 1121   GLUCOSE 93 09/12/2021 1044   CALCIUM 8.7 (L) 09/12/2021 1044   AST 152 (H) 09/12/2021 1044   ALT 108 (H) 09/12/2021 1044   ALKPHOS 333 (H) 09/12/2021 1044   BILITOT 1.5 (H) 09/12/2021 1044   BILITOT 0.5 07/24/2021 0937   PROT 6.1 (L) 09/12/2021 1044   PROT 6.2 07/24/2021 0937   ALBUMIN 3.1 (L) 09/12/2021 1044   ALBUMIN 3.9 07/24/2021 0937    RADIOGRAPHIC STUDIES: CT CHEST ABDOMEN PELVIS W CONTRAST  Result Date: 08/15/2021 CLINICAL DATA:  Unintended weight loss with nausea, initial encounter EXAM: CT CHEST, ABDOMEN, AND PELVIS WITH CONTRAST TECHNIQUE: Multidetector CT imaging of the chest, abdomen and pelvis was performed following the standard protocol during bolus administration of intravenous contrast. RADIATION DOSE REDUCTION: This exam was performed according to the departmental dose-optimization program which includes automated exposure control, adjustment of the mA and/or kV according to patient size and/or use of iterative reconstruction technique. CONTRAST:  129m OMNIPAQUE IOHEXOL 300 MG/ML SOLN. Patient was pre-medicated with 4 hour emergency premedication COMPARISON:  None. FINDINGS: CT CHEST FINDINGS Cardiovascular: Thoracic aorta demonstrates atherosclerotic calcifications without aneurysmal dilatation. Coronary calcifications are seen. No cardiac enlargement is noted. Pulmonary artery as visualized is within normal limits. Defibrillator device is noted. Mediastinum/Nodes: Thoracic inlet is within normal limits. There is a 12 mm short axis lymph node identified in the right paratracheal region. No other  sizable hilar or mediastinal adenopathy is noted. The esophagus appears within normal limits. Lungs/Pleura: Lungs are well aerated bilaterally and demonstrate emphysematous change. No focal confluent infiltrate or sizable effusion is seen. Mild tree-in-bud changes are noted throughout both lungs likely related to atypical pneumonia. Musculoskeletal: Degenerative changes of the thoracic spine are noted. No acute rib abnormality is seen. CT ABDOMEN PELVIS FINDINGS Hepatobiliary: Liver demonstrates some scattered calcifications likely related to granulomas. Mild periportal edema is noted which may be related volume overload or hepatic inflammatory change. Gallbladder is well distended with dependent gallstones. No wall thickening or pericholecystic fluid is noted. Pancreas: Unremarkable. No pancreatic ductal dilatation or surrounding inflammatory changes. Spleen: Normal in size without focal abnormality. Adrenals/Urinary Tract: Adrenal glands are within normal limits. Kidneys demonstrate a normal enhancement pattern bilaterally. Small cyst is noted  in the lower pole of right kidney. No calculi or obstructive changes are seen. The bladder is within normal limits. Stomach/Bowel: Diverticular change of the colon is noted. No evidence of diverticulitis is seen. No obstructive or inflammatory changes are noted. The appendix is not well visualized. No inflammatory changes to suggest appendicitis are noted. The stomach small bowel appear within normal limits. Vascular/Lymphatic: Atherosclerotic calcifications are identified. Scattered Peri aortic lymph nodes are noted with central decreased attenuation consistent with necrosis. The largest of these measures 10 mm in short axis. Adjacent to the pancreatic head and extending superiorly towards the gastroesophageal junction there are multiple areas of fluid attenuation with peripheral enhancement likely related to centrally necrotic lymph nodes. Adjacent to the pancreatic head  and portacaval space these measure 16 mm in short axis. The total size of the area surrounding the gastroesophageal junction measures 4.8 x 2.5 cm. Additionally some right iliac chain nodes are noted measuring up to 9 mm. Reproductive: Prostate is unremarkable. Other: No abdominal wall hernia or abnormality. No abdominopelvic ascites. Musculoskeletal: No acute or significant osseous findings. IMPRESSION: Multiple lymph nodes are noted surrounding the gastroesophageal junction, extending towards the pancreatic head, involving the portacaval space and pre portal region as well as the intra-aortocaval and periaortic and right iliac chain. These demonstrate central decreased attenuation suspicious for necrosis. A definitive primary neoplasm is not identified on this exam. PET-CT and tissue sampling would be helpful. Diverticulosis without diverticulitis. Cholelithiasis. Tree-in-bud changes within the lungs bilaterally likely related to atypical pneumonia. Electronically Signed   By: Inez Catalina M.D.   On: 08/15/2021 03:01   NM PET Image Initial (PI) Skull Base To Thigh (F-18 FDG)  Result Date: 09/03/2021 CLINICAL DATA:  Initial treatment strategy for esophageal cancer in a 76 year old male. EXAM: NUCLEAR MEDICINE PET SKULL BASE TO THIGH TECHNIQUE: 10.16 mCi F-18 FDG was injected intravenously. Full-ring PET imaging was performed from the skull base to thigh after the radiotracer. CT data was obtained and used for attenuation correction and anatomic localization. Fasting blood glucose: 98 mg/dl COMPARISON:  August 15, 2020. FINDINGS: Mediastinal blood pool activity: SUV max 2.16 Liver activity: SUV max NA NECK: No hypermetabolic lymph nodes in the neck. Incidental CT findings: none CHEST: Multiple areas of increased metabolic activity involving lymph nodes in the chest. (Image 52/3) LEFT supraclavicular lymph node 9 mm with a maximum SUV of 4.9. Similar increased metabolic activity associated with smaller lymph  nodes at the RIGHT thoracic inlet. (Image 59/3) 9 mm lymph node along the high RIGHT paratracheal chain with a maximum SUV of 3.0. (Image 73/3) subcentimeter RIGHT paratracheal nodal tissue with a maximum SUV of 4.9. Smaller lymph nodes track along the RIGHT paratracheal chain cephalad with slightly less metabolic activity still above mediastinal blood pool. Anterior mediastinal and LEFT hilar nodal tissue also with increased metabolic activity. LEFT axillary lymph nodes (image 68/3) maximum SUV of 4.3 measuring 8 mm. Marked distal esophageal and gastroesophageal uptake at the GE junction with maximum SUV of 11.2 (image 119/3) this measures approximately 2.9 x 2.2 cm. Diffuse nodularity throughout the chest with indistinct areas of nodularity and some mixed tree-in-bud opacities which have worsened since previous imaging. Greatest area 2.1 x 1.6 cm is not substantially hypermetabolic nor are other areas including enlarging nodule in the LEFT lower lobe (image 90/3) 9 mm with a maximum SUV of 1.8. Incidental CT findings: Calcified atheromatous plaque of the thoracic aorta. Three-vessel coronary artery disease. Normal heart size. Cardiac pacer defibrillator remains in place as  before. Central pulmonary vasculature is of normal caliber. Limited assessment of cardiovascular structures given lack of intravenous contrast. ABDOMEN/PELVIS: Gastroesophageal FDG uptake. Diffuse uptake tracking from the GE junction to the celiac origin following soft tissue that was seen on the previous study. Increased metabolic activity surrounds the celiac axis and measures approximately 3.7 x 1.8 cm (image 133/3) be hind the pancreas about the celiac origin, maximum SUV in this area up to 6.6. RIGHT adrenal uptake (image 135/3) maximum SUV 6.3. Increased density of the liver compatible with amiodarone deposition without visible lesion. Signs of mesenteric adenopathy and retroperitoneal adenopathy tracking into the pelvis. Lymph node at the  root of the small bowel mesentery (image 149/3) small size in terms of short axis dimension at 8 mm with a maximum SUV of 7.6. Indistinct nodal tissue along the LEFT periaortic chain (image 155/3) 9 mm with a maximum SUV of 5.6, slightly greater metabolic activity in other lymph nodes tracking along the LEFT periaortic chain. Also tracking along the RIGHT and LEFT common iliac region with a discrete lymph node at the level of the RIGHT common iliac lateral chain (image 188/3) 12 mm lymph node with a maximum SUV of 9.9. Incidental CT findings: No pericholecystic stranding. Signs of pancreatic atrophy. Normal spleen. Normal LEFT adrenal. No hydronephrosis. No acute gastrointestinal process with infiltration of the small bowel mesentery centrally. Small volume fluid in the pelvis slightly increased from recent imaging. Aortic atherosclerosis without aneurysm. SKELETON: Increased metabolic activity about degenerative areas in the spine in the cervical spine though with asymmetry showing a maximum SUV on image 31/3 involving cervical spine (adjacent 2 C3) musculature and adjacent spine with degenerative changes of 5.7. Asymmetry of soft tissues in this area could be due to muscular differences. Similarly increased metabolic activity on image 45/3 without visible lesion and with associated degenerative changes. This area is in the area of C5-6 and C6-7 greater on the RIGHT than the LEFT Areas of increased metabolic activity in the LEFT posterior acetabular roof (image 216/3) subtle sclerosis in this area approximately 1 cm with a maximum SUV of 7.7 Increased metabolic activity seen adjacent to lumbar spine (L2 level) on image 154/3 favored to be related to soft tissue changes and nodal disease, no discrete lesion in the spine. Soft tissue nodule in the anterior abdominal wall (image 138/3) maximum SUV of 4.0 measuring 8 mm. RIGHT proximal humeral uptake (image 49/3) subtle heterogeneity in the marrow space of the RIGHT  proximal humerus with a maximum SUV of 6.7, area of uptake measuring up to 2.2 cm Incidental CT findings: none IMPRESSION: Signs of gastroesophageal neoplasm with diffuse nodal metastasis in the abdomen involving the retroperitoneum and small bowel mesentery. Nodal disease also in the chest and pelvis. Signs of bony metastatic disease to the RIGHT proximal humerus and LEFT acetabulum with other areas which are equivocal in the lumbar spine and in the cervical spine either bony or soft tissue involvement adjacent to the spine. Muscular uptake is felt less likely in the cervical spine given focal appearance. Abdominal wall nodules suspicious for metastasis. Slight increase in pelvic ascites. Diffuse nodular opacities in the chest with interstitial prominence could be due in part to a metastatic process though the rapid change seen on the current study raising the question of diffuse, multifocal infection and or aspiration related changes. Signs of generalized atherosclerosis, coronary artery disease and presumed amiodarone deposition in the liver. Aortic Atherosclerosis (ICD10-I70.0). Electronically Signed   By: Zetta Bills M.D.   On:  09/03/2021 14:08   DG CHEST PORT 1 VIEW  Result Date: 08/26/2021 CLINICAL DATA:  Dyspnea, nausea EXAM: PORTABLE CHEST 1 VIEW COMPARISON:  CT chest dated August 15, 2021 FINDINGS: The heart size and mediastinal contours are within normal limits. Atherosclerotic calcification of aortic arch. Pacemaker leads terminating in the right atrium, right ventricle and coronary sinus. Low lung volumes. Bilateral hazy lung opacities, which may be secondary to prominence of the vessels due to low lung volumes are infectious/inflammatory process. No focal consolidation or pleural effusion. The visualized skeletal structures are unremarkable. IMPRESSION: Low lung volumes with hazy lung opacities bilaterally, which may represent prominent vessels due to low lung volumes. Differential includes  infectious/inflammatory process. No focal consolidation or large pleural effusion. Electronically Signed   By: Keane Police D.O.   On: 08/26/2021 08:08    PERFORMANCE STATUS (ECOG) : 2 - Symptomatic, <50% confined to bed  Review of Systems Unless otherwise noted, a complete review of systems is negative.  Physical Exam General: NAD Pulmonary: Unlabored Extremities: no edema, no joint deformities Skin: no rashes Neurological: Weakness but otherwise nonfocal  IMPRESSION: I met with patient and wife following their visit with Dr. Rogue Bussing.  Patient verbalized understanding that he has an advanced/end-stage cancer and is not felt to be a candidate for chemotherapy.  Option of radiation was discussed but patient and wife are undecided.  Right a lengthy conversation about hospice as a way to support him and ensure that he is comfortable at home.  Unfortunately, patient's wife had an unpleasant experience with hospice through the care of her mother.  Patient says that he wants to think about hospice prior to making a decision.  We discussed CODE STATUS.  Patient agreed that he would not want CPR nor would he want his life prolonged artificially on machines/ventilator.  He does have an AICD and has been talking to his cardiologist (Dr. Caryl Comes) about deactivating the defibrillator.  Patient would not want external defibrillation and is agreement with DNR/DNI.  PLAN: -Best supportive care -Recommend hospice involvement -DNR order signed -We will follow-up with patient via telephone in 1 to 2 weeks  Case and plan discussed with Dr. Rogue Bussing  Patient expressed understanding and was in agreement with this plan. He also understands that He can call the clinic at any time with any questions, concerns, or complaints.     Time Total: 25 minutes  Visit consisted of counseling and education dealing with the complex and emotionally intense issues of symptom management and palliative care in the  setting of serious and potentially life-threatening illness.Greater than 50%  of this time was spent counseling and coordinating care related to the above assessment and plan.  Signed by: Altha Harm, PhD, NP-C

## 2021-09-16 ENCOUNTER — Inpatient Hospital Stay: Payer: Medicare HMO

## 2021-09-16 ENCOUNTER — Other Ambulatory Visit: Payer: Self-pay

## 2021-09-16 NOTE — Progress Notes (Signed)
Nutrition Follow-up:    Patient with stage IV gastroesophageal cancer.  Palliative care following and recommending hospice care.    Spoke with wife via phone.  Wife reports that yesterday patient had a bad day with coughing and liquids coming back up.  Patient at times able to take sips of liquids (broth, pudding, electrolyte liquids, ensure original).  Ensure plus too thick for patient.      INTERVENTION:  Not meeting nutritional needs at this time.  Patient and family considering hospice.  Will follow up with Palliative care NP.  Patient DNR/DNI Wife has RD contact information   NEXT VISIT: no follow-up at this time  Jerry Farley, [place], [place] Registered Dietitian [phone] (mobile)

## 2021-09-17 DIAGNOSIS — C160 Malignant neoplasm of cardia: Secondary | ICD-10-CM | POA: Diagnosis not present

## 2021-09-17 DIAGNOSIS — E860 Dehydration: Secondary | ICD-10-CM | POA: Diagnosis not present

## 2021-09-19 ENCOUNTER — Inpatient Hospital Stay (HOSPITAL_BASED_OUTPATIENT_CLINIC_OR_DEPARTMENT_OTHER): Payer: Medicare HMO | Admitting: Hospice and Palliative Medicine

## 2021-09-19 DIAGNOSIS — R310 Gross hematuria: Secondary | ICD-10-CM | POA: Diagnosis not present

## 2021-09-19 DIAGNOSIS — C160 Malignant neoplasm of cardia: Secondary | ICD-10-CM

## 2021-09-19 NOTE — Progress Notes (Signed)
Virtual Visit via Telephone Note  I connected with Jerry Farley on 09/19/21 at 10:30 AM EST by telephone and verified that I am speaking with the correct person using two identifiers.  Location: Patient: Home Provider: Clinic   I discussed the limitations, risks, security and privacy concerns of performing an evaluation and management service by telephone and the availability of in person appointments. I also discussed with the patient that there may be a patient responsible charge related to this service. The patient expressed understanding and agreed to proceed.   History of Present Illness: Jerry Farley is a 76 y.o. male with multiple medical problems including severe NICM status post AICD, VT status post ablation, CHF, chronic hypertension chronic hypothyroidism now with newly diagnosed stage IV esophageal cancer with nodal/bone/abdominal wall metastases.  Patient is not felt to be a viable candidate for systemic chemotherapy.  Hospice is being recommended.  He is referred to palliative care to discuss goals.   Observations/Objective: I called and spoke with patient's wife.  Patient was unavailable as he was not feeling well.  Reportedly, patient is declining with dark-colored urine and dysuria.  Family has taken a urine sample to [facility] for evaluation and consideration of treatment for UTI.  They are waiting to hear back from PCP regarding results.  Patient's appetite remains poor.  I again discussed recommendation for hospice involvement.  Wife verbalized agreement with proceeding with hospice referral.  Assessment and Plan: Stage IV esophageal cancer -hospice referral  Follow Up Instructions: As needed   I discussed the assessment and treatment plan with the patient. The patient was provided an opportunity to ask questions and all were answered. The patient agreed with the plan and demonstrated an understanding of the instructions.   The patient was advised to call back or seek an  in-person evaluation if the symptoms worsen or if the condition fails to improve as anticipated.  I provided 10 minutes of non-face-to-face time during this encounter.   Irean Hong, NP

## 2021-09-20 ENCOUNTER — Telehealth: Payer: Self-pay | Admitting: Hospice and Palliative Medicine

## 2021-09-20 ENCOUNTER — Encounter: Payer: Self-pay | Admitting: Internal Medicine

## 2021-09-20 NOTE — Progress Notes (Signed)
On 2/24 I spoke to patient wife regarding declining health/poor PO intake, etc. agree with hospice meeting with hospice on 2/25. Unfortunately, patient is a poor candidate for any treatment options. DISCUSSED WITH JOSH Borders.  GB

## 2021-09-20 NOTE — Telephone Encounter (Signed)
I received a call from hospice nurse who is in the home admitting patient to hospice services.  Wife had questions about possible artificial nutrition as patient has very little appetite.  Patient has been rapidly declining in the context of end-stage cancer.  I spoke with wife by the phone and explained that I feel he would have very limited benefit from PEG given his overall decline.  She verbalized agreement.  He is currently able to swallow his medications but we talked about the probable need at some point to discontinue noncomfort medications and rotate to elixirs.  All questions answered to her verbalized satisfaction.

## 2021-09-24 ENCOUNTER — Ambulatory Visit (INDEPENDENT_AMBULATORY_CARE_PROVIDER_SITE_OTHER): Payer: Medicare HMO

## 2021-09-24 DIAGNOSIS — I5022 Chronic systolic (congestive) heart failure: Secondary | ICD-10-CM

## 2021-09-24 DIAGNOSIS — I472 Ventricular tachycardia, unspecified: Secondary | ICD-10-CM

## 2021-09-24 LAB — CUP PACEART REMOTE DEVICE CHECK
Battery Remaining Longevity: 31 mo
Battery Remaining Percentage: 47 %
Battery Voltage: 2.92 V
Brady Statistic AP VP Percent: 29 %
Brady Statistic AP VS Percent: 2.9 %
Brady Statistic AS VP Percent: 60 %
Brady Statistic AS VS Percent: 7.6 %
Brady Statistic RA Percent Paced: 30 %
Date Time Interrogation Session: 20230301020016
HighPow Impedance: 71 Ohm
HighPow Impedance: 71 Ohm
Implantable Lead Implant Date: 20130717
Implantable Lead Implant Date: 20130717
Implantable Lead Implant Date: 20130717
Implantable Lead Location: 753858
Implantable Lead Location: 753859
Implantable Lead Location: 753860
Implantable Lead Model: 4396
Implantable Lead Model: 5076
Implantable Lead Model: 6935
Implantable Pulse Generator Implant Date: 20191129
Lead Channel Impedance Value: 430 Ohm
Lead Channel Impedance Value: 490 Ohm
Lead Channel Impedance Value: 700 Ohm
Lead Channel Pacing Threshold Amplitude: 0.5 V
Lead Channel Pacing Threshold Amplitude: 0.75 V
Lead Channel Pacing Threshold Amplitude: 1.5 V
Lead Channel Pacing Threshold Pulse Width: 0.5 ms
Lead Channel Pacing Threshold Pulse Width: 0.5 ms
Lead Channel Pacing Threshold Pulse Width: 0.7 ms
Lead Channel Sensing Intrinsic Amplitude: 2.1 mV
Lead Channel Sensing Intrinsic Amplitude: 7.4 mV
Lead Channel Setting Pacing Amplitude: 2 V
Lead Channel Setting Pacing Amplitude: 2.5 V
Lead Channel Setting Pacing Amplitude: 2.75 V
Lead Channel Setting Pacing Pulse Width: 0.5 ms
Lead Channel Setting Pacing Pulse Width: 0.7 ms
Lead Channel Setting Sensing Sensitivity: 0.5 mV
Pulse Gen Serial Number: 9812589

## 2021-09-26 ENCOUNTER — Telehealth: Payer: Self-pay

## 2021-09-29 ENCOUNTER — Other Ambulatory Visit: Payer: Self-pay | Admitting: Internal Medicine

## 2021-09-29 ENCOUNTER — Encounter: Payer: Self-pay | Admitting: Internal Medicine

## 2021-09-29 NOTE — Progress Notes (Signed)
I called the patient?s wife unable to reach her. I left a voicemail expressing my condolences on passing away.

## 2021-10-03 NOTE — Progress Notes (Signed)
Remote ICD transmission.   

## 2021-10-03 NOTE — Telephone Encounter (Signed)
Spoke with family follwing the death ?

## 2021-10-25 NOTE — Telephone Encounter (Signed)
The patient wife called  because the patient was unresponsive and wanted the nurse to review his transmission. Portia told the patient wife to call 911.  ?

## 2021-10-25 NOTE — Telephone Encounter (Signed)
Wife called extremely upset as she felt patient has passed away and she was concerned about his defibrillator firing. RN was unable to access patient records at the time as she was trying to assess the situation and instruct wife to call 911 if patient was unresponsive. With little information to go on and with wife extremely upset best advice was to call 911. Assessed for magnet in the home and wife states she did not have one. Upon further chart review once call was disconnected by wife, patient has been in hospice care since Feb. It is documented that patient was to discuss prognosis with oncologist and call office back if he decided to have therapies deactivated. No documentation that patient, hospice, or oncology called office to request deactivation of tachy therapies.  ?

## 2021-10-25 DEATH — deceased

## 2021-10-27 ENCOUNTER — Telehealth: Payer: Self-pay | Admitting: Internal Medicine

## 2021-10-27 NOTE — Telephone Encounter (Signed)
Deceased wife would like to talk with Dr. Lovena Le or nurse regarding patient's care. ?

## 2021-10-28 NOTE — Telephone Encounter (Signed)
Returned call to Pt's wife. ? ?She wants to talk to Dr. Lovena Le about Pt's VT ablation done in July 2022. ? ?Advised would return call when Dr. Lovena Le is in office.  Will also make Dr. Caryl Comes aware. ?

## 2021-10-29 NOTE — Telephone Encounter (Signed)
Dr. Lovena Le called Pt's wife. ? ?Dr. Lovena Le spent 30 minutes speaking with wife advising that Pt's ablation procedure and the diagnosis of esophageal cancer were not related. ? ?Condolences expressed. ?

## 2021-11-10 ENCOUNTER — Encounter: Payer: Medicare HMO | Admitting: Physician Assistant

## 2022-08-27 IMAGING — DX DG CHEST 1V PORT
1 series · 1 of 1 positions shown · non-contrast
Comparison: CT chest dated August 15, 2021

CLINICAL DATA: Dyspnea, nausea

EXAM:
PORTABLE CHEST 1 VIEW

[chest ap]
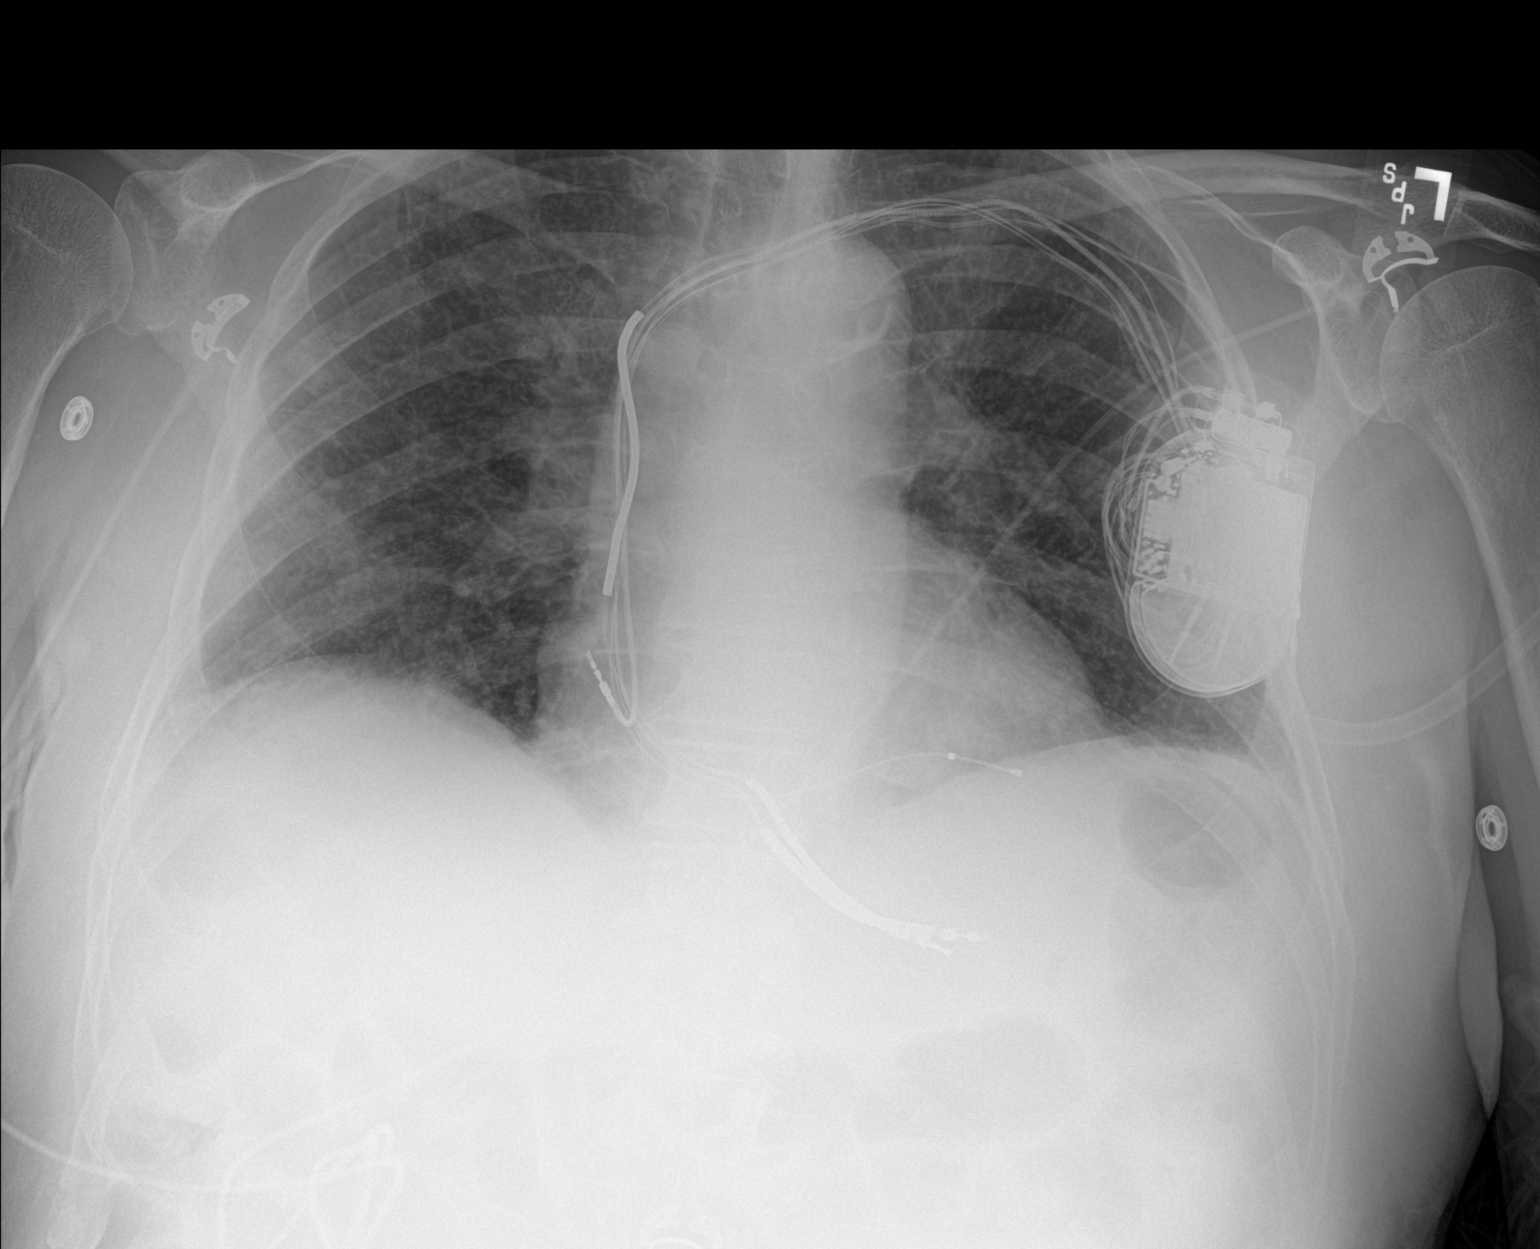

[1 of 1 positions shown; findings below may reference images not displayed]

FINDINGS: The heart size and mediastinal contours are within normal limits.
Atherosclerotic calcification of aortic arch. Pacemaker leads
terminating in the right atrium, right ventricle and coronary sinus.
Low lung volumes. Bilateral hazy lung opacities, which may be
secondary to prominence of the vessels due to low lung volumes are
infectious/inflammatory process. No focal consolidation or pleural
effusion. The visualized skeletal structures are unremarkable.
IMPRESSION: Low lung volumes with hazy lung opacities bilaterally, which may
represent prominent vessels due to low lung volumes. Differential
includes infectious/inflammatory process. No focal consolidation or
large pleural effusion.

## 2022-09-04 IMAGING — CT NM PET TUM IMG INITIAL (PI) SKULL BASE T - THIGH
1 of 9 series · 1 of 25 positions shown · non-contrast
Comparison: August 15, 2020.

CLINICAL DATA: Initial treatment strategy for esophageal cancer in
a 75-year-old male.

EXAM:
NUCLEAR MEDICINE PET SKULL BASE TO THIGH
TECHNIQUE: 10.16 mCi F-18 FDG was injected intravenously. Full-ring PET imaging
was performed from the skull base to thigh after the radiotracer. CT
data was obtained and used for attenuation correction and anatomic
localization.
Fasting blood glucose: 98 mg/dl

[Series 3: ct wb 5.0 b30f · axial · 5.0mm · 0.98mm/px · 1 of 290 slices shown]
[im 290/290  brain]
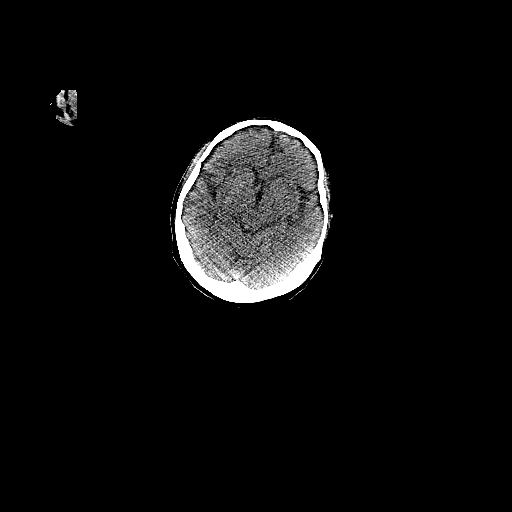

[1 of 25 positions shown; findings below may reference images not displayed]

FINDINGS: Mediastinal blood pool activity: SUV max

Liver activity: SUV max NA

NECK: No hypermetabolic lymph nodes in the neck.

Incidental CT findings: none

CHEST: Multiple areas of increased metabolic activity involving
lymph nodes in the chest. (Image 52/3) LEFT supraclavicular lymph
node 9 mm with a maximum SUV of 4.9. Similar increased metabolic
activity associated with smaller lymph nodes at the RIGHT thoracic
inlet.

(Image 59/3) 9 mm lymph node along the high RIGHT paratracheal chain
with a maximum SUV of 3.0.

(Image 73/3) subcentimeter RIGHT paratracheal nodal tissue with a
maximum SUV of 4.9. Smaller lymph nodes track along the RIGHT
paratracheal chain cephalad with slightly less metabolic activity
still above mediastinal blood pool.

Anterior mediastinal and LEFT hilar nodal tissue also with increased
metabolic activity.

LEFT axillary lymph nodes (image 68/3) maximum SUV of 4.3 measuring
8 mm.

Marked distal esophageal and gastroesophageal uptake at the GE
junction with maximum SUV of 11.2 (image 119/3) this measures
approximately 2.9 x 2.2 cm.

Diffuse nodularity throughout the chest with indistinct areas of
nodularity and some mixed tree-in-bud opacities which have worsened
since previous imaging. Greatest area 2.1 x 1.6 cm is not
substantially hypermetabolic nor are other areas including enlarging
nodule in the LEFT lower lobe (image 90/3) 9 mm with a maximum SUV
of 1.8.

Incidental CT findings: Calcified atheromatous plaque of the
thoracic aorta. Three-vessel coronary artery disease. Normal heart
size. Cardiac pacer defibrillator remains in place as before.
Central pulmonary vasculature is of normal caliber. Limited
assessment of cardiovascular structures given lack of intravenous
contrast.

ABDOMEN/PELVIS: Gastroesophageal FDG uptake. Diffuse uptake tracking
from the GE junction to the celiac origin following soft tissue that
was seen on the previous study. Increased metabolic activity
surrounds the celiac axis and measures approximately 3.7 x 1.8 cm
(image 133/3) be hind the pancreas about the celiac origin, maximum
SUV in this area up to 6.6.

RIGHT adrenal uptake (image 135/3) maximum SUV 6.3. Increased
density of the liver compatible with amiodarone deposition without
visible lesion.

Signs of mesenteric adenopathy and retroperitoneal adenopathy
tracking into the pelvis.

Lymph node at the root of the small bowel mesentery (image 149/3)
small size in terms of short axis dimension at 8 mm with a maximum
SUV of 7.6.

Indistinct nodal tissue along the LEFT periaortic chain (image
155/3) 9 mm with a maximum SUV of 5.6, slightly greater metabolic
activity in other lymph nodes tracking along the LEFT periaortic
chain. Also tracking along the RIGHT and LEFT common iliac region
with a discrete lymph node at the level of the RIGHT common iliac
lateral chain (image 188/3) 12 mm lymph node with a maximum SUV of
9.9.

Incidental CT findings: No pericholecystic stranding. Signs of
pancreatic atrophy. Normal spleen. Normal LEFT adrenal. No
hydronephrosis.

No acute gastrointestinal process with infiltration of the small
bowel mesentery centrally. Small volume fluid in the pelvis slightly
increased from recent imaging. Aortic atherosclerosis without
aneurysm.

SKELETON: Increased metabolic activity about degenerative areas in
the spine in the cervical spine though with asymmetry showing a
maximum SUV on image [DATE] involving cervical spine (adjacent 2 C3)
musculature and adjacent spine with degenerative changes of 5.7.
Asymmetry of soft tissues in this area could be due to muscular
differences. Similarly increased metabolic activity on image 45/3
without visible lesion and with associated degenerative changes.
This area is in the area of C5-6 and C6-7 greater on the RIGHT than
the LEFT

Areas of increased metabolic activity in the LEFT posterior
acetabular roof (image 216/3) subtle sclerosis in this area
approximately 1 cm with a maximum SUV of

Increased metabolic activity seen adjacent to lumbar spine (L2
level) on image 154/3 favored to be related to soft tissue changes
and nodal disease, no discrete lesion in the spine.

Soft tissue nodule in the anterior abdominal wall (image 138/3)
maximum SUV of 4.0 measuring 8 mm.

RIGHT proximal humeral uptake (image 49/3) subtle heterogeneity in
the marrow space of the RIGHT proximal humerus with a maximum SUV of
6.7, area of uptake measuring up to 2.2 cm

Incidental CT findings: none
IMPRESSION: Signs of gastroesophageal neoplasm with diffuse nodal metastasis in
the abdomen involving the retroperitoneum and small bowel mesentery.

Nodal disease also in the chest and pelvis.

Signs of bony metastatic disease to the RIGHT proximal humerus and
LEFT acetabulum with other areas which are equivocal in the lumbar
spine and in the cervical spine either bony or soft tissue
involvement adjacent to the spine. Muscular uptake is felt less
likely in the cervical spine given focal appearance.

Abdominal wall nodules suspicious for metastasis.

Slight increase in pelvic ascites.

Diffuse nodular opacities in the chest with interstitial prominence
could be due in part to a metastatic process though the rapid change
seen on the current study raising the question of diffuse,
multifocal infection and or aspiration related changes.

Signs of generalized atherosclerosis, coronary artery disease and
presumed amiodarone deposition in the liver.

Aortic Atherosclerosis (MNEDQ-MT5.5).
